# Patient Record
Sex: Male | Born: 1935
Health system: Southern US, Community
[De-identification: ages and names within clinical notes are randomized; demographics above are authoritative.]

## PROBLEM LIST (undated history)

## (undated) DIAGNOSIS — I472 Ventricular tachycardia, unspecified: Secondary | ICD-10-CM

## (undated) DIAGNOSIS — Z8674 Personal history of sudden cardiac arrest: Secondary | ICD-10-CM

## (undated) DIAGNOSIS — I10 Essential (primary) hypertension: Secondary | ICD-10-CM

## (undated) DIAGNOSIS — E785 Hyperlipidemia, unspecified: Secondary | ICD-10-CM

## (undated) DIAGNOSIS — I447 Left bundle-branch block, unspecified: Secondary | ICD-10-CM

## (undated) DIAGNOSIS — I255 Ischemic cardiomyopathy: Secondary | ICD-10-CM

## (undated) DIAGNOSIS — Z8679 Personal history of other diseases of the circulatory system: Secondary | ICD-10-CM

## (undated) DIAGNOSIS — I509 Heart failure, unspecified: Secondary | ICD-10-CM

## (undated) DIAGNOSIS — I2510 Atherosclerotic heart disease of native coronary artery without angina pectoris: Secondary | ICD-10-CM

## (undated) HISTORY — DX: Personal history of other diseases of the circulatory system: Z86.79

## (undated) HISTORY — DX: Ventricular tachycardia, unspecified: I47.20

## (undated) HISTORY — DX: Ischemic cardiomyopathy: I25.5

## (undated) HISTORY — DX: Personal history of sudden cardiac arrest: Z86.74

## (undated) HISTORY — DX: Atherosclerotic heart disease of native coronary artery without angina pectoris: I25.10

## (undated) HISTORY — PX: TONSILECTOMY, ADENOIDECTOMY, BILATERAL MYRINGOTOMY AND TUBES: SHX2538

## (undated) HISTORY — DX: Essential (primary) hypertension: I10

## (undated) HISTORY — DX: Left bundle-branch block, unspecified: I44.7

## (undated) HISTORY — DX: Hyperlipidemia, unspecified: E78.5

## (undated) HISTORY — PX: DOPPLER ECHOCARDIOGRAPHY: SHX263

## (undated) HISTORY — PX: CARDIAC DEFIBRILLATOR PLACEMENT: SHX171

## (undated) HISTORY — DX: Heart failure, unspecified: I50.9

## (undated) HISTORY — DX: Ventricular tachycardia: I47.2

---

## 2001-03-19 ENCOUNTER — Inpatient Hospital Stay (HOSPITAL_COMMUNITY): Admission: EM | Admit: 2001-03-19 | Discharge: 2001-03-23 | Payer: Self-pay | Admitting: Cardiology

## 2001-03-22 ENCOUNTER — Encounter: Payer: Self-pay | Admitting: Cardiology

## 2001-03-22 ENCOUNTER — Encounter: Payer: Self-pay | Admitting: Internal Medicine

## 2003-03-26 ENCOUNTER — Encounter: Payer: Self-pay | Admitting: Physician Assistant

## 2003-09-24 ENCOUNTER — Encounter: Payer: Self-pay | Admitting: Cardiology

## 2003-11-25 ENCOUNTER — Ambulatory Visit: Payer: Self-pay | Admitting: Internal Medicine

## 2004-03-18 ENCOUNTER — Ambulatory Visit: Payer: Self-pay

## 2004-04-07 ENCOUNTER — Ambulatory Visit: Payer: Self-pay | Admitting: Cardiology

## 2004-04-08 ENCOUNTER — Ambulatory Visit: Payer: Self-pay | Admitting: Cardiology

## 2004-04-14 ENCOUNTER — Ambulatory Visit (HOSPITAL_COMMUNITY): Admission: RE | Admit: 2004-04-14 | Discharge: 2004-04-14 | Payer: Self-pay | Admitting: Cardiology

## 2004-04-14 ENCOUNTER — Ambulatory Visit: Payer: Self-pay | Admitting: Internal Medicine

## 2004-04-23 ENCOUNTER — Ambulatory Visit: Payer: Self-pay | Admitting: Internal Medicine

## 2004-04-27 ENCOUNTER — Ambulatory Visit: Payer: Self-pay | Admitting: Internal Medicine

## 2004-05-11 ENCOUNTER — Ambulatory Visit: Payer: Self-pay | Admitting: Internal Medicine

## 2004-05-24 ENCOUNTER — Ambulatory Visit: Payer: Self-pay | Admitting: Internal Medicine

## 2004-05-27 ENCOUNTER — Ambulatory Visit: Payer: Self-pay | Admitting: Internal Medicine

## 2004-07-21 ENCOUNTER — Ambulatory Visit: Payer: Self-pay | Admitting: Cardiology

## 2004-08-05 ENCOUNTER — Ambulatory Visit: Payer: Self-pay | Admitting: Cardiology

## 2004-08-12 ENCOUNTER — Ambulatory Visit: Payer: Self-pay

## 2004-08-24 ENCOUNTER — Ambulatory Visit: Payer: Self-pay | Admitting: Internal Medicine

## 2004-09-09 ENCOUNTER — Ambulatory Visit: Payer: Self-pay

## 2004-11-29 ENCOUNTER — Ambulatory Visit: Payer: Self-pay

## 2004-12-15 ENCOUNTER — Ambulatory Visit: Payer: Self-pay | Admitting: Cardiology

## 2005-01-18 ENCOUNTER — Ambulatory Visit: Payer: Self-pay | Admitting: Internal Medicine

## 2005-02-24 ENCOUNTER — Ambulatory Visit: Payer: Self-pay | Admitting: Internal Medicine

## 2005-03-14 ENCOUNTER — Ambulatory Visit: Payer: Self-pay | Admitting: Cardiology

## 2005-03-21 ENCOUNTER — Ambulatory Visit: Payer: Self-pay | Admitting: Internal Medicine

## 2005-06-09 ENCOUNTER — Ambulatory Visit: Payer: Self-pay | Admitting: Cardiology

## 2005-07-13 ENCOUNTER — Ambulatory Visit: Payer: Self-pay | Admitting: Cardiovascular Disease

## 2005-08-03 ENCOUNTER — Ambulatory Visit: Payer: Self-pay | Admitting: Cardiology

## 2005-08-24 ENCOUNTER — Ambulatory Visit: Payer: Self-pay | Admitting: Internal Medicine

## 2005-09-08 ENCOUNTER — Ambulatory Visit: Payer: Self-pay | Admitting: Internal Medicine

## 2005-11-15 ENCOUNTER — Ambulatory Visit: Payer: Self-pay | Admitting: Internal Medicine

## 2005-12-19 ENCOUNTER — Ambulatory Visit: Payer: Self-pay | Admitting: Cardiology

## 2005-12-20 ENCOUNTER — Ambulatory Visit: Payer: Self-pay | Admitting: Cardiology

## 2006-03-09 ENCOUNTER — Ambulatory Visit: Payer: Self-pay | Admitting: Internal Medicine

## 2006-03-27 ENCOUNTER — Ambulatory Visit: Payer: Self-pay | Admitting: Cardiology

## 2006-06-08 ENCOUNTER — Ambulatory Visit: Payer: Self-pay | Admitting: Internal Medicine

## 2006-06-19 ENCOUNTER — Ambulatory Visit: Payer: Self-pay | Admitting: Cardiology

## 2006-06-21 ENCOUNTER — Ambulatory Visit: Payer: Self-pay | Admitting: Cardiology

## 2006-06-21 LAB — CONVERTED CEMR LAB
BUN: 19 mg/dL (ref 6–23)
Basophils Absolute: 0 10*3/uL (ref 0.0–0.1)
Basophils Relative: 0.7 % (ref 0.0–1.0)
CO2: 30 meq/L (ref 19–32)
Calcium: 9.5 mg/dL (ref 8.4–10.5)
Chloride: 107 meq/L (ref 96–112)
Creatinine, Ser: 1.4 mg/dL (ref 0.4–1.5)
Eosinophils Absolute: 0.2 10*3/uL (ref 0.0–0.6)
Eosinophils Relative: 3 % (ref 0.0–5.0)
GFR calc Af Amer: 64 mL/min
GFR calc non Af Amer: 53 mL/min
Glucose, Bld: 75 mg/dL (ref 70–99)
HCT: 39.8 % (ref 39.0–52.0)
Hemoglobin: 14.1 g/dL (ref 13.0–17.0)
Lymphocytes Relative: 24 % (ref 12.0–46.0)
MCHC: 35.3 g/dL (ref 30.0–36.0)
MCV: 87.8 fL (ref 78.0–100.0)
Monocytes Absolute: 0.8 10*3/uL — ABNORMAL HIGH (ref 0.2–0.7)
Monocytes Relative: 12.7 % — ABNORMAL HIGH (ref 3.0–11.0)
Neutro Abs: 3.6 10*3/uL (ref 1.4–7.7)
Neutrophils Relative %: 59.6 % (ref 43.0–77.0)
Platelets: 171 10*3/uL (ref 150–400)
Potassium: 4.1 meq/L (ref 3.5–5.1)
RBC: 4.54 M/uL (ref 4.22–5.81)
RDW: 14.4 % (ref 11.5–14.6)
Sodium: 142 meq/L (ref 135–145)
WBC: 6 10*3/uL (ref 4.5–10.5)

## 2006-07-12 ENCOUNTER — Ambulatory Visit: Payer: Self-pay | Admitting: Cardiology

## 2006-08-08 ENCOUNTER — Ambulatory Visit: Payer: Self-pay | Admitting: Cardiology

## 2006-08-23 ENCOUNTER — Ambulatory Visit: Payer: Self-pay | Admitting: Cardiology

## 2006-09-07 ENCOUNTER — Ambulatory Visit: Payer: Self-pay | Admitting: Internal Medicine

## 2006-09-11 ENCOUNTER — Ambulatory Visit: Payer: Self-pay | Admitting: Cardiology

## 2006-10-10 ENCOUNTER — Ambulatory Visit: Payer: Self-pay | Admitting: Cardiology

## 2006-11-28 ENCOUNTER — Ambulatory Visit: Payer: Self-pay | Admitting: Cardiology

## 2006-11-29 ENCOUNTER — Ambulatory Visit: Payer: Self-pay | Admitting: Cardiology

## 2006-11-29 ENCOUNTER — Encounter: Payer: Self-pay | Admitting: Cardiology

## 2006-11-29 ENCOUNTER — Ambulatory Visit: Payer: Self-pay

## 2006-11-29 LAB — CONVERTED CEMR LAB
ALT: 50 units/L (ref 0–53)
AST: 42 units/L — ABNORMAL HIGH (ref 0–37)
Albumin: 4.2 g/dL (ref 3.5–5.2)
Alkaline Phosphatase: 60 units/L (ref 39–117)
BUN: 16 mg/dL (ref 6–23)
Basophils Absolute: 0 10*3/uL (ref 0.0–0.1)
Basophils Relative: 0.1 % (ref 0.0–1.0)
Bilirubin, Direct: 0.2 mg/dL (ref 0.0–0.3)
CO2: 31 meq/L (ref 19–32)
Calcium: 9.7 mg/dL (ref 8.4–10.5)
Chloride: 99 meq/L (ref 96–112)
Cholesterol: 131 mg/dL (ref 0–200)
Creatinine, Ser: 1.1 mg/dL (ref 0.4–1.5)
Eosinophils Absolute: 0.1 10*3/uL (ref 0.0–0.6)
Eosinophils Relative: 1.8 % (ref 0.0–5.0)
GFR calc Af Amer: 84 mL/min
GFR calc non Af Amer: 70 mL/min
Glucose, Bld: 128 mg/dL — ABNORMAL HIGH (ref 70–99)
HCT: 41.7 % (ref 39.0–52.0)
HDL: 39.8 mg/dL (ref >39.0)
Hemoglobin: 14.6 g/dL (ref 13.0–17.0)
LDL Cholesterol: 53 mg/dL (ref 0–99)
Lymphocytes Relative: 26.3 % (ref 12.0–46.0)
MCHC: 35 g/dL (ref 30.0–36.0)
MCV: 88.1 fL (ref 78.0–100.0)
Monocytes Absolute: 0.6 10*3/uL (ref 0.2–0.7)
Monocytes Relative: 9.8 % (ref 3.0–11.0)
Neutro Abs: 3.5 10*3/uL (ref 1.4–7.7)
Neutrophils Relative %: 62 % (ref 43.0–77.0)
Platelets: 172 10*3/uL (ref 150–400)
Potassium: 4.7 meq/L (ref 3.5–5.1)
RBC: 4.73 M/uL (ref 4.22–5.81)
RDW: 13.7 % (ref 11.5–14.6)
Sodium: 139 meq/L (ref 135–145)
Total Bilirubin: 0.9 mg/dL (ref 0.3–1.2)
Total CHOL/HDL Ratio: 3.3
Total Protein: 7.1 g/dL (ref 6.0–8.3)
Triglycerides: 190 mg/dL — ABNORMAL HIGH (ref 0–149)
VLDL: 38 mg/dL (ref 0–40)
WBC: 5.7 10*3/uL (ref 4.5–10.5)

## 2006-12-12 ENCOUNTER — Ambulatory Visit: Payer: Self-pay | Admitting: Cardiology

## 2007-01-09 ENCOUNTER — Ambulatory Visit: Payer: Self-pay | Admitting: Cardiology

## 2007-02-13 ENCOUNTER — Ambulatory Visit: Payer: Self-pay | Admitting: Cardiology

## 2007-03-01 ENCOUNTER — Ambulatory Visit: Payer: Self-pay | Admitting: Internal Medicine

## 2007-03-21 ENCOUNTER — Ambulatory Visit: Payer: Self-pay | Admitting: Cardiology

## 2007-04-03 ENCOUNTER — Ambulatory Visit: Payer: Self-pay | Admitting: Cardiology

## 2007-04-17 ENCOUNTER — Ambulatory Visit: Payer: Self-pay | Admitting: Cardiology

## 2007-05-15 ENCOUNTER — Ambulatory Visit: Payer: Self-pay | Admitting: Cardiology

## 2007-05-17 ENCOUNTER — Ambulatory Visit: Payer: Self-pay | Admitting: Cardiology

## 2007-05-17 LAB — CONVERTED CEMR LAB
BUN: 22 mg/dL (ref 6–23)
Basophils Absolute: 0.2 10*3/uL — ABNORMAL HIGH (ref 0.0–0.1)
Basophils Relative: 2.9 % — ABNORMAL HIGH (ref 0.0–1.0)
CO2: 29 meq/L (ref 19–32)
Calcium: 9.8 mg/dL (ref 8.4–10.5)
Chloride: 103 meq/L (ref 96–112)
Creatinine, Ser: 1.1 mg/dL (ref 0.4–1.5)
Eosinophils Absolute: 0.1 10*3/uL (ref 0.0–0.7)
Eosinophils Relative: 2 % (ref 0.0–5.0)
GFR calc Af Amer: 84 mL/min
GFR calc non Af Amer: 70 mL/min
Glucose, Bld: 104 mg/dL — ABNORMAL HIGH (ref 70–99)
HCT: 42.8 % (ref 39.0–52.0)
Hemoglobin: 14.6 g/dL (ref 13.0–17.0)
Lymphocytes Relative: 24 % (ref 12.0–46.0)
MCHC: 34.2 g/dL (ref 30.0–36.0)
MCV: 89 fL (ref 78.0–100.0)
Monocytes Absolute: 0.5 10*3/uL (ref 0.1–1.0)
Monocytes Relative: 7.4 % (ref 3.0–12.0)
Neutro Abs: 4.5 10*3/uL (ref 1.4–7.7)
Neutrophils Relative %: 63.7 % (ref 43.0–77.0)
Platelets: 176 10*3/uL (ref 150–400)
Potassium: 4.5 meq/L (ref 3.5–5.1)
RBC: 4.81 M/uL (ref 4.22–5.81)
RDW: 14.1 % (ref 11.5–14.6)
Sodium: 139 meq/L (ref 135–145)
WBC: 7 10*3/uL (ref 4.5–10.5)

## 2007-06-12 ENCOUNTER — Ambulatory Visit: Payer: Self-pay | Admitting: Cardiology

## 2007-07-18 ENCOUNTER — Ambulatory Visit: Payer: Self-pay | Admitting: Cardiology

## 2007-07-18 ENCOUNTER — Ambulatory Visit: Payer: Self-pay | Admitting: Cardiovascular Disease

## 2007-08-02 ENCOUNTER — Ambulatory Visit: Payer: Self-pay | Admitting: Internal Medicine

## 2007-08-13 ENCOUNTER — Ambulatory Visit: Payer: Self-pay | Admitting: Cardiology

## 2007-08-16 ENCOUNTER — Ambulatory Visit: Payer: Self-pay | Admitting: Internal Medicine

## 2007-09-12 ENCOUNTER — Ambulatory Visit: Payer: Self-pay | Admitting: Cardiology

## 2007-09-12 ENCOUNTER — Ambulatory Visit: Payer: Self-pay | Admitting: Cardiovascular Disease

## 2007-09-12 LAB — CONVERTED CEMR LAB
BUN: 23 mg/dL (ref 6–23)
Basophils Absolute: 0 10*3/uL (ref 0.0–0.1)
Basophils Relative: 0.2 % (ref 0.0–3.0)
CO2: 28 meq/L (ref 19–32)
Calcium: 9.2 mg/dL (ref 8.4–10.5)
Chloride: 110 meq/L (ref 96–112)
Creatinine, Ser: 1.5 mg/dL (ref 0.4–1.5)
Eosinophils Absolute: 0.1 10*3/uL (ref 0.0–0.7)
Eosinophils Relative: 2 % (ref 0.0–5.0)
GFR calc Af Amer: 59 mL/min
GFR calc non Af Amer: 49 mL/min
Glucose, Bld: 94 mg/dL (ref 70–99)
HCT: 40.7 % (ref 39.0–52.0)
Hemoglobin: 14.4 g/dL (ref 13.0–17.0)
Lymphocytes Relative: 22.1 % (ref 12.0–46.0)
MCHC: 35.3 g/dL (ref 30.0–36.0)
MCV: 89.8 fL (ref 78.0–100.0)
Monocytes Absolute: 0.7 10*3/uL (ref 0.1–1.0)
Monocytes Relative: 10.7 % (ref 3.0–12.0)
Neutro Abs: 4.3 10*3/uL (ref 1.4–7.7)
Neutrophils Relative %: 65 % (ref 43.0–77.0)
Platelets: 182 10*3/uL (ref 150–400)
Potassium: 4.9 meq/L (ref 3.5–5.1)
RBC: 4.53 M/uL (ref 4.22–5.81)
RDW: 13.7 % (ref 11.5–14.6)
Sodium: 142 meq/L (ref 135–145)
WBC: 6.6 10*3/uL (ref 4.5–10.5)

## 2007-12-13 ENCOUNTER — Ambulatory Visit: Payer: Self-pay | Admitting: Internal Medicine

## 2008-01-24 ENCOUNTER — Ambulatory Visit: Payer: Self-pay | Admitting: Cardiology

## 2008-01-31 ENCOUNTER — Ambulatory Visit: Payer: Self-pay | Admitting: Cardiology

## 2008-01-31 LAB — CONVERTED CEMR LAB
ALT: 24 units/L (ref 0–53)
AST: 23 units/L (ref 0–37)
Albumin: 4.1 g/dL (ref 3.5–5.2)
Alkaline Phosphatase: 51 units/L (ref 39–117)
BUN: 34 mg/dL — ABNORMAL HIGH (ref 6–23)
Basophils Absolute: 0 10*3/uL (ref 0.0–0.1)
Basophils Relative: 0.3 % (ref 0.0–3.0)
Bilirubin, Direct: 0.2 mg/dL (ref 0.0–0.3)
CO2: 26 meq/L (ref 19–32)
Calcium: 9.7 mg/dL (ref 8.4–10.5)
Chloride: 99 meq/L (ref 96–112)
Cholesterol: 107 mg/dL (ref 0–200)
Creatinine, Ser: 1.5 mg/dL (ref 0.4–1.5)
Eosinophils Absolute: 0.2 10*3/uL (ref 0.0–0.7)
Eosinophils Relative: 2.4 % (ref 0.0–5.0)
GFR calc Af Amer: 59 mL/min
GFR calc non Af Amer: 49 mL/min
Glucose, Bld: 148 mg/dL — ABNORMAL HIGH (ref 70–99)
HCT: 42.5 % (ref 39.0–52.0)
HDL: 31.2 mg/dL — ABNORMAL LOW (ref >39.0)
Hemoglobin: 14.4 g/dL (ref 13.0–17.0)
LDL Cholesterol: 40 mg/dL (ref 0–99)
Lymphocytes Relative: 24.3 % (ref 12.0–46.0)
MCHC: 33.8 g/dL (ref 30.0–36.0)
MCV: 88.7 fL (ref 78.0–100.0)
Monocytes Absolute: 0.6 10*3/uL (ref 0.1–1.0)
Monocytes Relative: 9.2 % (ref 3.0–12.0)
Neutro Abs: 4.2 10*3/uL (ref 1.4–7.7)
Neutrophils Relative %: 63.8 % (ref 43.0–77.0)
Platelets: 165 10*3/uL (ref 150–400)
Potassium: 4.9 meq/L (ref 3.5–5.1)
RBC: 4.8 M/uL (ref 4.22–5.81)
RDW: 13 % (ref 11.5–14.6)
Sodium: 138 meq/L (ref 135–145)
Total Bilirubin: 1 mg/dL (ref 0.3–1.2)
Total CHOL/HDL Ratio: 3.4
Total Protein: 6.6 g/dL (ref 6.0–8.3)
Triglycerides: 177 mg/dL — ABNORMAL HIGH (ref 0–149)
VLDL: 35 mg/dL (ref 0–40)
WBC: 6.6 10*3/uL (ref 4.5–10.5)

## 2008-03-19 ENCOUNTER — Encounter: Payer: Self-pay | Admitting: Internal Medicine

## 2008-03-25 ENCOUNTER — Ambulatory Visit: Payer: Self-pay | Admitting: Internal Medicine

## 2008-03-25 ENCOUNTER — Encounter: Payer: Self-pay | Admitting: Internal Medicine

## 2008-03-25 DIAGNOSIS — I10 Essential (primary) hypertension: Secondary | ICD-10-CM | POA: Insufficient documentation

## 2008-03-25 DIAGNOSIS — I4901 Ventricular fibrillation: Secondary | ICD-10-CM

## 2008-03-25 DIAGNOSIS — I472 Ventricular tachycardia: Secondary | ICD-10-CM

## 2008-03-25 DIAGNOSIS — Z950 Presence of cardiac pacemaker: Secondary | ICD-10-CM | POA: Insufficient documentation

## 2008-03-25 DIAGNOSIS — Z8674 Personal history of sudden cardiac arrest: Secondary | ICD-10-CM

## 2008-03-25 DIAGNOSIS — I447 Left bundle-branch block, unspecified: Secondary | ICD-10-CM

## 2008-03-25 DIAGNOSIS — E785 Hyperlipidemia, unspecified: Secondary | ICD-10-CM

## 2008-03-25 DIAGNOSIS — I2589 Other forms of chronic ischemic heart disease: Secondary | ICD-10-CM

## 2008-03-25 DIAGNOSIS — I2510 Atherosclerotic heart disease of native coronary artery without angina pectoris: Secondary | ICD-10-CM

## 2008-03-25 DIAGNOSIS — Z95810 Presence of automatic (implantable) cardiac defibrillator: Secondary | ICD-10-CM

## 2008-06-09 DIAGNOSIS — I5042 Chronic combined systolic (congestive) and diastolic (congestive) heart failure: Secondary | ICD-10-CM | POA: Insufficient documentation

## 2008-06-09 DIAGNOSIS — I5022 Chronic systolic (congestive) heart failure: Secondary | ICD-10-CM | POA: Insufficient documentation

## 2008-06-10 ENCOUNTER — Ambulatory Visit: Payer: Self-pay | Admitting: Cardiology

## 2008-06-10 LAB — CONVERTED CEMR LAB
BUN: 19 mg/dL
Basophils Absolute: 0.1 K/uL
Basophils Relative: 0.8 %
CO2: 30 meq/L
Calcium: 9.5 mg/dL
Chloride: 109 meq/L
Creatinine, Ser: 1.2 mg/dL
Eosinophils Absolute: 0.2 K/uL
Eosinophils Relative: 2.4 %
GFR calc non Af Amer: 62.81 mL/min
Glucose, Bld: 88 mg/dL
HCT: 38.3 % — ABNORMAL LOW
Hemoglobin: 13.4 g/dL
Lymphocytes Relative: 25.5 %
Lymphs Abs: 1.7 K/uL
MCHC: 35 g/dL
MCV: 89.1 fL
Monocytes Absolute: 0.6 K/uL
Monocytes Relative: 9.7 %
Neutro Abs: 4.1 K/uL
Neutrophils Relative %: 61.6 %
Platelets: 160 K/uL
Potassium: 4.1 meq/L
RBC: 4.3 M/uL
RDW: 13.2 %
Sodium: 142 meq/L
WBC: 6.7 10*3/microliter

## 2008-06-12 ENCOUNTER — Encounter: Payer: Self-pay | Admitting: Cardiology

## 2008-09-11 ENCOUNTER — Ambulatory Visit: Payer: Self-pay | Admitting: Internal Medicine

## 2008-10-12 HISTORY — PX: CARDIAC CATHETERIZATION: SHX172

## 2008-10-16 ENCOUNTER — Ambulatory Visit: Payer: Self-pay | Admitting: Cardiology

## 2008-10-21 ENCOUNTER — Telehealth (INDEPENDENT_AMBULATORY_CARE_PROVIDER_SITE_OTHER): Payer: Self-pay | Admitting: *Deleted

## 2008-10-22 ENCOUNTER — Ambulatory Visit: Payer: Self-pay

## 2008-10-22 ENCOUNTER — Ambulatory Visit: Payer: Self-pay | Admitting: Cardiovascular Disease

## 2008-10-22 ENCOUNTER — Encounter (HOSPITAL_COMMUNITY): Admission: RE | Admit: 2008-10-22 | Discharge: 2008-12-31 | Payer: Self-pay | Admitting: Cardiovascular Disease

## 2008-10-22 ENCOUNTER — Ambulatory Visit: Payer: Self-pay | Admitting: Cardiology

## 2008-10-24 LAB — CONVERTED CEMR LAB
ALT: 26 units/L (ref 0–53)
AST: 27 units/L (ref 0–37)
Albumin: 4.2 g/dL (ref 3.5–5.2)
Alkaline Phosphatase: 59 units/L (ref 39–117)
BUN: 29 mg/dL — ABNORMAL HIGH (ref 6–23)
Basophils Absolute: 0 10*3/uL (ref 0.0–0.1)
Basophils Relative: 0.3 % (ref 0.0–3.0)
Bilirubin, Direct: 0.2 mg/dL (ref 0.0–0.3)
CO2: 30 meq/L (ref 19–32)
Calcium: 9.6 mg/dL (ref 8.4–10.5)
Chloride: 108 meq/L (ref 96–112)
Cholesterol: 114 mg/dL (ref 0–200)
Creatinine, Ser: 1.5 mg/dL (ref 0.4–1.5)
Eosinophils Absolute: 0.2 10*3/uL (ref 0.0–0.7)
Eosinophils Relative: 3.2 % (ref 0.0–5.0)
GFR calc non Af Amer: 48.5 mL/min (ref >60)
Glucose, Bld: 138 mg/dL — ABNORMAL HIGH (ref 70–99)
HCT: 38 % — ABNORMAL LOW (ref 39.0–52.0)
HDL: 34.9 mg/dL — ABNORMAL LOW (ref >39.00)
Hemoglobin: 13.2 g/dL (ref 13.0–17.0)
LDL Cholesterol: 44 mg/dL (ref 0–99)
Lymphocytes Relative: 26 % (ref 12.0–46.0)
Lymphs Abs: 1.6 10*3/uL (ref 0.7–4.0)
MCHC: 34.8 g/dL (ref 30.0–36.0)
MCV: 89.7 fL (ref 78.0–100.0)
Monocytes Absolute: 0.5 10*3/uL (ref 0.1–1.0)
Monocytes Relative: 8.5 % (ref 3.0–12.0)
Neutro Abs: 3.8 10*3/uL (ref 1.4–7.7)
Neutrophils Relative %: 62 % (ref 43.0–77.0)
Platelets: 177 10*3/uL (ref 150.0–400.0)
Potassium: 5.8 meq/L — ABNORMAL HIGH (ref 3.5–5.1)
RBC: 4.23 M/uL (ref 4.22–5.81)
RDW: 13.5 % (ref 11.5–14.6)
Sodium: 145 meq/L (ref 135–145)
TSH: 2.05 microintl units/mL (ref 0.35–5.50)
Total Bilirubin: 0.9 mg/dL (ref 0.3–1.2)
Total CHOL/HDL Ratio: 3
Total Protein: 7.2 g/dL (ref 6.0–8.3)
Triglycerides: 174 mg/dL — ABNORMAL HIGH (ref 0.0–149.0)
VLDL: 34.8 mg/dL (ref 0.0–40.0)
WBC: 6.1 10*3/uL (ref 4.5–10.5)

## 2008-10-29 ENCOUNTER — Encounter (INDEPENDENT_AMBULATORY_CARE_PROVIDER_SITE_OTHER): Payer: Self-pay | Admitting: *Deleted

## 2008-10-29 ENCOUNTER — Ambulatory Visit: Payer: Self-pay | Admitting: Cardiology

## 2008-10-29 DIAGNOSIS — R9430 Abnormal result of cardiovascular function study, unspecified: Secondary | ICD-10-CM | POA: Insufficient documentation

## 2008-10-29 LAB — CONVERTED CEMR LAB
BUN: 20 mg/dL (ref 6–23)
Eosinophils Relative: 1.3 % (ref 0.0–5.0)
GFR calc non Af Amer: 77.43 mL/min (ref >60)
HCT: 39.2 % (ref 39.0–52.0)
Hemoglobin: 13.4 g/dL (ref 13.0–17.0)
Lymphocytes Relative: 18.8 % (ref 12.0–46.0)
Lymphs Abs: 1.4 10*3/uL (ref 0.7–4.0)
Monocytes Relative: 8.5 % (ref 3.0–12.0)
Platelets: 185 10*3/uL (ref 150.0–400.0)
Potassium: 4.6 meq/L (ref 3.5–5.1)
Prothrombin Time: 11.3 s (ref 9.1–11.7)
Sodium: 138 meq/L (ref 135–145)
WBC: 7.5 10*3/uL (ref 4.5–10.5)
aPTT: 25.9 s (ref 21.7–28.8)

## 2008-10-31 ENCOUNTER — Inpatient Hospital Stay (HOSPITAL_BASED_OUTPATIENT_CLINIC_OR_DEPARTMENT_OTHER): Admission: RE | Admit: 2008-10-31 | Discharge: 2008-10-31 | Payer: Self-pay | Admitting: Cardiology

## 2008-10-31 ENCOUNTER — Ambulatory Visit: Payer: Self-pay | Admitting: Internal Medicine

## 2008-11-13 ENCOUNTER — Ambulatory Visit: Payer: Self-pay | Admitting: Cardiology

## 2009-01-15 ENCOUNTER — Telehealth: Payer: Self-pay | Admitting: Internal Medicine

## 2009-01-15 ENCOUNTER — Encounter: Payer: Self-pay | Admitting: Internal Medicine

## 2009-01-27 ENCOUNTER — Ambulatory Visit: Payer: Self-pay | Admitting: Internal Medicine

## 2009-02-06 ENCOUNTER — Ambulatory Visit: Payer: Self-pay | Admitting: Internal Medicine

## 2009-02-10 LAB — CONVERTED CEMR LAB
Basophils Relative: 0.3 % (ref 0.0–3.0)
CO2: 31 meq/L (ref 19–32)
Chloride: 103 meq/L (ref 96–112)
Creatinine, Ser: 1 mg/dL (ref 0.4–1.5)
Eosinophils Absolute: 0.1 10*3/uL (ref 0.0–0.7)
Eosinophils Relative: 1.5 % (ref 0.0–5.0)
HCT: 40.8 % (ref 39.0–52.0)
Hemoglobin: 13.6 g/dL (ref 13.0–17.0)
MCHC: 33.2 g/dL (ref 30.0–36.0)
MCV: 88.6 fL (ref 78.0–100.0)
Monocytes Absolute: 0.5 10*3/uL (ref 0.1–1.0)
Neutro Abs: 4 10*3/uL (ref 1.4–7.7)
Potassium: 4.2 meq/L (ref 3.5–5.1)
RBC: 4.61 M/uL (ref 4.22–5.81)
Sodium: 141 meq/L (ref 135–145)

## 2009-02-13 ENCOUNTER — Encounter (INDEPENDENT_AMBULATORY_CARE_PROVIDER_SITE_OTHER): Payer: Self-pay | Admitting: *Deleted

## 2009-02-13 ENCOUNTER — Ambulatory Visit (HOSPITAL_COMMUNITY): Admission: RE | Admit: 2009-02-13 | Discharge: 2009-02-14 | Payer: Self-pay | Admitting: Internal Medicine

## 2009-02-13 ENCOUNTER — Ambulatory Visit: Payer: Self-pay | Admitting: Internal Medicine

## 2009-02-19 ENCOUNTER — Telehealth: Payer: Self-pay | Admitting: Cardiology

## 2009-02-25 ENCOUNTER — Encounter (INDEPENDENT_AMBULATORY_CARE_PROVIDER_SITE_OTHER): Payer: Self-pay | Admitting: *Deleted

## 2009-03-05 ENCOUNTER — Encounter: Payer: Self-pay | Admitting: Internal Medicine

## 2009-03-05 ENCOUNTER — Ambulatory Visit: Payer: Self-pay

## 2009-05-15 ENCOUNTER — Ambulatory Visit: Payer: Self-pay | Admitting: Cardiology

## 2009-06-12 ENCOUNTER — Ambulatory Visit: Payer: Self-pay | Admitting: Internal Medicine

## 2009-09-11 ENCOUNTER — Ambulatory Visit: Payer: Self-pay

## 2009-09-11 ENCOUNTER — Encounter: Payer: Self-pay | Admitting: Internal Medicine

## 2009-11-03 ENCOUNTER — Encounter: Payer: Self-pay | Admitting: Cardiology

## 2009-11-04 ENCOUNTER — Encounter: Payer: Self-pay | Admitting: Cardiology

## 2009-11-04 ENCOUNTER — Ambulatory Visit: Payer: Self-pay | Admitting: Cardiology

## 2009-11-05 LAB — CONVERTED CEMR LAB
BUN: 18 mg/dL (ref 6–23)
Basophils Relative: 0.5 % (ref 0.0–3.0)
CO2: 32 meq/L (ref 19–32)
Chloride: 106 meq/L (ref 96–112)
Creatinine, Ser: 1 mg/dL (ref 0.4–1.5)
Eosinophils Absolute: 0.1 10*3/uL (ref 0.0–0.7)
Eosinophils Relative: 1.9 % (ref 0.0–5.0)
HCT: 40.3 % (ref 39.0–52.0)
Lymphs Abs: 1.3 10*3/uL (ref 0.7–4.0)
MCHC: 34.3 g/dL (ref 30.0–36.0)
MCV: 89.4 fL (ref 78.0–100.0)
Monocytes Absolute: 0.6 10*3/uL (ref 0.1–1.0)
Neutrophils Relative %: 71.6 % (ref 43.0–77.0)
Platelets: 188 10*3/uL (ref 150.0–400.0)
Potassium: 4.4 meq/L (ref 3.5–5.1)
RBC: 4.51 M/uL (ref 4.22–5.81)

## 2009-11-12 ENCOUNTER — Encounter: Payer: Self-pay | Admitting: Cardiology

## 2009-11-12 ENCOUNTER — Ambulatory Visit: Payer: Self-pay | Admitting: Cardiology

## 2009-11-12 ENCOUNTER — Ambulatory Visit: Payer: Self-pay

## 2009-11-12 ENCOUNTER — Ambulatory Visit (HOSPITAL_COMMUNITY): Admission: RE | Admit: 2009-11-12 | Discharge: 2009-11-12 | Payer: Self-pay | Admitting: Cardiology

## 2009-12-21 ENCOUNTER — Encounter: Payer: Self-pay | Admitting: Internal Medicine

## 2009-12-21 ENCOUNTER — Ambulatory Visit: Payer: Self-pay | Admitting: Internal Medicine

## 2009-12-31 ENCOUNTER — Ambulatory Visit: Payer: Self-pay | Admitting: Internal Medicine

## 2010-01-31 LAB — CONVERTED CEMR LAB
GFR calc non Af Amer: 57.2 mL/min (ref >60)
Glucose, Bld: 95 mg/dL (ref 70–99)
Potassium: 4.5 meq/L (ref 3.5–5.1)
Sodium: 140 meq/L (ref 135–145)

## 2010-02-04 NOTE — Cardiovascular Report (Signed)
Summary: Office Visit   Office Visit   Imported By: Roderic Ovens 01/01/2010 16:16:36  _____________________________________________________________________  External Attachment:    Type:   Image     Comment:   External Document

## 2010-02-04 NOTE — Cardiovascular Report (Signed)
Summary: Pre Op Orders  Pre Op Orders   Imported By: Roderic Ovens 02/02/2009 13:41:37  _____________________________________________________________________  External Attachment:    Type:   Image     Comment:   External Document

## 2010-02-04 NOTE — Cardiovascular Report (Signed)
Summary: Office Visit   Office Visit   Imported By: Roderic Ovens 03/13/2009 12:28:35  _____________________________________________________________________  External Attachment:    Type:   Image     Comment:   External Document

## 2010-02-04 NOTE — Procedures (Signed)
Summary: [facility] Device [facility]   Current Medications (verified): 1)  Coreg 25 Mg Tabs (Carvedilol) .[name].. 1 By Mouth Two Times A Day 2)  Spironolactone 25 Mg Tabs (Spironolactone) .... Take One- Half Tablet By Mouth Daily 3)  Crestor 20 Mg Tabs (Rosuvastatin Calcium) .... Take One Tablet By Mouth Daily. 4)  Aspirin 81 Mg Tbec (Aspirin) .... Take One Tablet By Mouth Daily 5)  Enalapril Maleate 10 Mg Tabs (Enalapril Maleate) .... Take 1/2 Tablet Two Times A Day 6)  Indomethacin Cr 75 Mg Cr-Caps (Indomethacin) .[name].. 1 Tab  As Needed  Allergies (verified): No Known Drug Allergies   Parameters Mode:  DDDR+     Lower Rate Limit:  60     Upper Rate Limit:  130 Paced AV Delay:  300      ICD Specifications Following MD:  Sherryl Manges, MD     ICD Vendor:  St Jude     ICD Model Number:  [phone]     ICD Serial Number:  [id number] ICD DOI:  02/13/2009     ICD Implanting MD:  Lewayne Bunting, MD  Lead 1:    Location: RA     DOI: 03/21/2001     Model #: 8119     Serial #: [id number]     Status: active Lead 2:    Location: RV     DOI: 03/21/2001     Model #: 5621     Serial #: [id number]     Status: active Lead 3:    Location: LV     DOI: 02/13/2009     Model #: 1158T     Serial #: [id number]     Status: active  Indications::  VT/VF arrest  Explantation Comments: 02/13/2009 [facility] [employer] [id number] explanted.  ICD Follow Up Remote Check?  No Charge Time:  9.0 seconds     Battery Est. Longevity:  5.9 years Underlying rhythm:  Brady@54  ICD Dependent:  No       ICD Device Measurements Atrium:  Amplitude: 3.6 mV, Impedance: 410 ohms, Threshold: 1.0 V at 0.6 msec Right Ventricle:  Amplitude: 12 mV, Impedance: 540 ohms, Threshold: 0.75 V at 0.5 msec Left Ventricle:  Impedance: 530 ohms, Threshold: 1.0 V at 0.5 msec Configuration: LV RING TO RV COIL Shock Impedance: 47 ohms   Episodes MS Episodes:  33     Percent Mode Switch:  <1%     Coumadin:  No Shock:  0     ATP:  0     Nonsustained:  0     Atrial  Pacing:  84%     Ventricular Pacing:  96%  Brady Parameters Mode DDDR     Lower Rate Limit:  60     Upper Rate Limit 120 PAV 150     Sensed AV Delay:  100  Tachy Zones VF:  230     VT:  200     VT1:  160     Next Cardiology Appt Due:  06/04/2010 Tech Comments:    Quick opt done and reprogrammed as above to improve V-pacing %.  PVARP reprogrammed for PMT episodes.  33 mode switch episodes the lonest 2:32 minutes, - coumadin. Rate response somewhat blunted but adequate for the patient's level of activity.  Mr. Schwager is limited in his activity because of leg pains.  For financial reasons we will see Mr. Thompson in the [place] office every 6 months.   Altha Harm, LPN  December 21, 2009 9:12 AM

## 2010-02-04 NOTE — Miscellaneous (Signed)
Summary: Device upgrade  Clinical Lists Changes  Observations: Added new observation of ICDLEADSTAT3: active (02/13/2009 13:34) Added new observation of ICDLEADSER3: [id number] (02/13/2009 13:34) Added new observation of ICDLEADMOD3: 1158T (02/13/2009 13:34) Added new observation of ICDLEADLOC3: LV (02/13/2009 13:34) Added new observation of ICDLEADDOI3: 02/13/2009 (02/13/2009 13:34) Added new observation of ICD IMP MD: Lewayne Bunting, MD (02/13/2009 13:34) Added new observation of ICD IMPL DTE: 02/13/2009 (02/13/2009 13:34) Added new observation of ICD SERL#: [id number]  (02/13/2009 13:34) Added new observation of ICD MODL#: [id number]  (02/13/2009 30:86) Added new observation of ICDMANUFACTR: St Jude  (02/13/2009 13:34) Added new observation of ICDEXPLCOMM: 02/13/2009 [employer] [id number] explanted.  (02/13/2009 13:34)       Parameters Mode:  DDDR+     Lower Rate Limit:  60     Upper Rate Limit:  130 Paced AV Delay:  300      ICD Specifications Following MD:  Lewayne Bunting, MD     ICD Vendor:  St Jude     ICD Model Number:  [id number]     ICD Serial Number:  [id number] ICD DOI:  02/13/2009     ICD Implanting MD:  Lewayne Bunting, MD  Lead 1:    Location: RA     DOI: 03/21/2001     Model #: [id number]     Serial #: [id number]     Status: active Lead 2:    Location: RV     DOI: 03/21/2001     Model #: 7253     Serial #: [id number]     Status: active Lead 3:    Location: LV     DOI: 02/13/2009     Model #: 1158T     Serial #: [id number]     Status: active  Indications::  VT/VF arrest  Explantation Comments: 02/13/2009 [facility] Scientific [employer]zm [id number] explanted.  ICD Follow Up ICD Dependent:  No      Episodes Coumadin:  No  Brady Parameters Mode VVI     Lower Rate Limit:  40     Upper Rate Limit 40  Tachy Zones VF:  210     VT:  160

## 2010-02-04 NOTE — Assessment & Plan Note (Signed)
Summary: f60m   Visit Type:  Follow-up Primary Provider:  none  CC:  no complaints.  History of Present Illness: The patient is 77 years old and return for management of CAD and CHF. He had an inferior MI in 1992. In 2003 he had ventricular fibrillation and underwent implantation of an ICD. Ejection fraction was 25% and left bundle branch block. He quite well over a number of years.    In February he had an ICD change and upgrade to a biventricular pacemaker. He says he feels stronger since that time. He's had no recent chest pain shortness of breath or palpitations.  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  Current Medications (verified): 1)  Coreg 25 Mg Tabs (Carvedilol) .[name].. 1 By Mouth Two Times A Day 2)  Spironolactone 25 Mg Tabs (Spironolactone) .... Take One- Half Tablet By Mouth Daily 3)  Crestor 20 Mg Tabs (Rosuvastatin Calcium) .... Take One Tablet By Mouth Daily. 4)  Aspirin 81 Mg Tbec (Aspirin) .... Take One Tablet By Mouth Daily 5)  Enalapril Maleate 10 Mg Tabs (Enalapril Maleate) .... Take 1/2 Tablet Two Times A Day 6)  Indomethacin Cr 75 Mg Cr-Caps (Indomethacin) .[name].. 1 Tab  As Needed  Allergies: No Known Drug Allergies  Past History:  Past Medical History: Reviewed history from 06/09/2008 and no changes required. HISTORY OF SUDDEN CARDIAC ARREST (ICD-V12.53) LBBB (ICD-426.3) CAD, UNSPECIFIED SITE (ICD-414.00) ICD - IN SITU (ICD-V45.02) Hx of VENTRICULAR TACHYCARDIA (ICD-427.1) Hx of VENTRICULAR FIBRILLATION (ICD-427.41) ISCHEMIC CARDIOMYOPATHY (ICD-414.8) HYPERTENSION, UNSPECIFIED (ICD-401.9) DYSLIPIDEMIA (ICD-272.4) 1. Coronary artery disease status post remote diaphragmatic wall     infarction and percutaneous coronary intervention in 1992. 2. Ischemic cardiomyopathy with ejection fraction 25%. 3. Class II to III congestive heart failure. 4. Status post Guidant dual chamber defibrillator programmed to  ventricular demand pacing. 5. Left bundle-branch block. 6. Hypertension. 7. Hyperlipidemia.   Review of Systems       ROS is negative except as outlined in HPI.   Vital Signs:  Patient profile:   77 year old male Height:      68 inches Weight:      198 pounds Pulse rate:   76 / minute Pulse rhythm:   regular BP sitting:   138 / 70  (left arm)  Vitals Entered By: Jacquelin Hawking, CMA (November 04, 2009 9:04 AM)  Physical Exam  Additional Exam:  Gen. Well-nourished, in no distress   Neck: No JVD, thyroid not enlarged, no carotid bruits Lungs: No tachypnea, clear without rales, rhonchi or wheezes Cardiovascular: Rhythm regular, PMI not displaced,  heart sounds  normal, no murmurs or gallops, no peripheral edema, pulses normal in all 4 extremities. Abdomen: BS normal, abdomen soft and non-tender without masses or organomegaly, no hepatosplenomegaly. MS: No deformities, no cyanosis or clubbing   Neuro:  No focal sns   Skin:  no lesions     Parameters Mode:  DDDR+     Lower Rate Limit:  60     Upper Rate Limit:  130 Paced AV Delay:  300      ICD Specifications Following MD:  Lewayne Bunting, MD     ICD Vendor:  St Jude     ICD Model Number:  [phone]     ICD Serial Number:  [id number] ICD DOI:  02/13/2009     ICD Implanting MD:  Lewayne Bunting, MD  Lead 1:    Location: RA  DOI: 03/21/2001     Model #: 1610     Serial #: [id number]     Status: active Lead 2:    Location: RV     DOI: 03/21/2001     Model #: 0981     Serial #: [id number]     Status: active Lead 3:    Location: LV     DOI: 02/13/2009     Model #: 1158T     Serial #: [id number]     Status: active  Indications::  VT/VF arrest  Explantation Comments: 02/13/2009 [facility] [employer] [id number] explanted.  ICD Follow Up ICD Dependent:  No       ICD Device Measurements Configuration: LV RING TO RV COIL  Episodes Coumadin:  No  Brady Parameters Mode DDDR     Lower Rate Limit:  60     Upper Rate Limit 120 PAV 180     Sensed  AV Delay:  160  Tachy Zones VF:  230     VT:  200     VT1:  160     Impression & Recommendations:  Problem # 1:  SYSTOLIC HEART FAILURE, CHRONIC (ICD-428.22) He has an ischemic cardiomyopathy with an ejection fraction of 25% and chronic systolic heart failure. He has not had prompt with volume overload appears euvolemic today. This appears stable. We will repeat an echocardiogram to see if his LV function has improved since his upgrade to a biventricular pacemaker. His updated medication list for this problem includes:    Coreg 25 Mg Tabs (Carvedilol) .[name]... 1 by mouth two times a day    Spironolactone 25 Mg Tabs (Spironolactone) .[name]... Take one- half tablet by mouth daily    Aspirin 81 Mg Tbec (Aspirin) .[name]... Take one tablet by mouth daily    Enalapril Maleate 10 Mg Tabs (Enalapril maleate) .[name]... Take 1/2 tablet two times a day  Orders: EKG w/ Interpretation (93000) Echocardiogram (Echo) TLB-BMP (Basic Metabolic Panel-BMET) (80048-METABOL) TLB-CBC Platelet - w/Differential (85025-CBCD)  Problem # 2:  CAD, UNSPECIFIED SITE (ICD-414.00) He had the MI in 1992 treated with PCI. He's had no recent chest pain is from appears stable. His updated medication list for this problem includes:    Coreg 25 Mg Tabs (Carvedilol) .[name]... 1 by mouth two times a day    Aspirin 81 Mg Tbec (Aspirin) .[name]... Take one tablet by mouth daily    Enalapril Maleate 10 Mg Tabs (Enalapril maleate) .[name]... Take 1/2 tablet two times a day  Orders: EKG w/ Interpretation (93000) Echocardiogram (Echo) TLB-BMP (Basic Metabolic Panel-BMET) (80048-METABOL) TLB-CBC Platelet - w/Differential (85025-CBCD)  Problem # 3:  ICD - IN SITU (ICD-V45.02) He has an ICD with a biventricular upgrade in February. He feels his exercise tolerance has improved since that time. We plan a 2-D echo to evaluate his LV function.  Patient Instructions: 1)  Your physician recommends that you continue on your current medications as directed. Please  refer to the Current Medication list given to you today. 2)  Your physician wants you to follow-up in: 6 months with Dr. Ladona Ridgel.  You will receive a reminder letter in the mail two months in advance. If you don't receive a letter, please call our office to schedule the follow-up appointment. 3)  Labwork today: bmet/cbc (414.01;428.22) 4)  Your physician has requested that you have an echocardiogram.  Echocardiography is a painless test that uses sound waves to create images of your heart. It provides your doctor with information about the size and  shape of your heart and how well your heart's chambers and valves are working.  This procedure takes approximately one hour. There are no restrictions for this procedure. Prescriptions: ENALAPRIL MALEATE 10 MG TABS (ENALAPRIL MALEATE) Take 1/2 tablet two times a day  #30 x 11   Entered by:   Sherri Rad, RN, BSN   Authorized by:   Lenoria Farrier, MD, [facility]   Signed by:   Sherri Rad, RN, BSN on 11/04/2009   Method used:   Electronically to        [facility] (retail)       [address]       [place], [place]  [address]       Ph: [phone]       Fax: [phone]   RxID:   [id number] CRESTOR 20 MG TABS (ROSUVASTATIN CALCIUM) Take one tablet by mouth daily. Brand medically necessary #30 x 11   Entered by:   Sherri Rad, RN, BSN   Authorized by:   Lenoria Farrier, MD, [facility]   Signed by:   Sherri Rad, RN, BSN on 11/04/2009   Method used:   Electronically to        [facility] (retail)       [address]       [place], [place]  [id number]       Ph: [phone]       Fax: [phone]   RxID:   [id number] SPIRONOLACTONE 25 MG TABS (SPIRONOLACTONE) Take one- half tablet by mouth daily  #30 x 6   Entered by:   Sherri Rad, RN, BSN   Authorized by:   Lenoria Farrier, MD, [facility]   Signed by:   Sherri Rad, RN, BSN on 11/04/2009    Method used:   Electronically to        [facility] (retail)       [address]       [place], [place]  [address]       Ph: [phone]       Fax: [phone]   RxID:   [id number] COREG 25 MG TABS (CARVEDILOL) 1 by mouth two times a day  #60 x 11   Entered by:   Sherri Rad, RN, BSN   Authorized by:   Lenoria Farrier, MD, [facility]   Signed by:   Sherri Rad, RN, BSN on 11/04/2009   Method used:   Electronically to        [facility] (retail)       [address]       [place], [place]  [address]       Ph: [phone]       Fax: [phone]   RxID:   [id number]

## 2010-02-04 NOTE — Cardiovascular Report (Signed)
Summary: Office Visit Remote   Office Visit Remote   Imported By: Roderic Ovens 01/21/2009 12:05:26  _____________________________________________________________________  External Attachment:    Type:   Image     Comment:   External Document

## 2010-02-04 NOTE — Progress Notes (Signed)
Summary: inthodemicain     Follow-up for Phone Call       Follow-up by: Oswald Hillock,  February 19, 2009 9:13 AM  Additional Follow-up for Phone Call Additional follow up Details #1::       Additional Follow-up by: Burnett Kanaris, CNA,  February 19, 2009 10:37 AM    Additional Follow-up for Phone Call Additional follow up Details #2::    I called Mr Wagster  and he does take indomethician 75mg   After confirming this I called it into pharamcy. Follow-up by: Burnett Kanaris, CNA,  February 19, 2009 10:38 AM  0.

## 2010-02-04 NOTE — Miscellaneous (Signed)
Clinical Lists Changes  Observations: Added new observation of CXR RESULTS:    Findings: A permanent pacemaker is now present with AICD lead.  No   pneumothorax is seen.  Mild cardiomegaly is noted.  There are   degenerative changes in the thoracic spine.    IMPRESSION:   Permanent pacer with AICD lead now present.  No pneumothorax.    Read By:  Juline Patch,  M.D. (02/14/2009 12:31) Added new observation of CARDCATHFIND:  Left circumflex was made predominantly of a large branching ramus.  The   circumflex itself was rather small.  The circumflex again was calcified   in the ostial and proximal portions.  There was a 20-30% stenosis in the   proximal portion of the ramus branch.      Right coronary artery was a dominant vessel that was totally occluded in   the mid section after the takeoff of the RV branch.  The distal right   coronary artery was filled by left-to-left collaterals.      Left ventriculogram shows a dilated left ventricle.  There is akinesis   of the entire inferior wall extending into the apex.  EF is 20-25%.   There appears to be perhaps mild mitral regurgitation.      ASSESSMENT:   1. One-vessel coronary artery disease with totally occluded right       coronary artery.  He does have calcification in the left system,       but no high-grade stenosis.   2. Severe left ventricular dysfunction which does not appear to be       significantly changed from previous.   3. Well-compensated hemodynamics.      Plan will be to continue medical therapy and follow up with Dr. Juanda Chance.               Bevelyn Buckles. Bensimhon, MD  (10/31/2008 12:30) Added new observation of NUCLEAR NOS: QPS  Raw Data Images:  Normal; no motion artifact; normal heart/lung ratio. Stress Images:  Decreased inferior activity Rest Images:  Decreased inferior activity Subtraction (SDS):  IMI Transient Ischemic Dilatation:  1.02  (Normal <1.22)  Lung/Heart Ratio:  .31  (Normal  <0.45)  Quantitative Gated Spect Images  QGS EDV:  278 ml QGS ESV:  227 ml QGS EF:  18 % QGS cine images:  Diffuse hypokinesis with inferior akinesis  Findings  Low risk nuclear study  Evidence for inferior infarct     Overall Impression   Exercise Capacity: Adenosine study with no exercise. BP Response: Normal blood pressure response. Clinical Symptoms: No chest pain ECG Impression: No significant ST segment change suggestive of ischemia. Overall Impression: Large inferior MI from apex to base Overall Impression Comments: large inferior wall MI from apex to base.  ? risk stratify for BiV AICD EF 18%    Signed by Colon Branch, MD, [facility] on 10/22/2008 at 3:19 PM  (10/22/2008 12:29)      Nuclear Study  Procedure date:  10/22/2008  Findings:      QPS  Raw Data Images:  Normal; no motion artifact; normal heart/lung ratio. Stress Images:  Decreased inferior activity Rest Images:  Decreased inferior activity Subtraction (SDS):  IMI Transient Ischemic Dilatation:  1.02  (Normal <1.22)  Lung/Heart Ratio:  .31  (Normal <0.45)  Quantitative Gated Spect Images  QGS EDV:  278 ml QGS ESV:  227 ml QGS EF:  18 % QGS cine images:  Diffuse hypokinesis with inferior akinesis  Findings  Low  risk nuclear study  Evidence for inferior infarct     Overall Impression   Exercise Capacity: Adenosine study with no exercise. BP Response: Normal blood pressure response. Clinical Symptoms: No chest pain ECG Impression: No significant ST segment change suggestive of ischemia. Overall Impression: Large inferior MI from apex to base Overall Impression Comments: large inferior wall MI from apex to base.  ? risk stratify for BiV AICD EF 18%    Signed by Colon Branch, MD, [facility] on 10/22/2008 at 3:19 PM   Cardiac Cath  Procedure date:  10/31/2008  Findings:       Left circumflex was made predominantly of a large branching ramus.  The   circumflex itself was  rather small.  The circumflex again was calcified   in the ostial and proximal portions.  There was a 20-30% stenosis in the   proximal portion of the ramus branch.      Right coronary artery was a dominant vessel that was totally occluded in   the mid section after the takeoff of the RV branch.  The distal right   coronary artery was filled by left-to-left collaterals.      Left ventriculogram shows a dilated left ventricle.  There is akinesis   of the entire inferior wall extending into the apex.  EF is 20-25%.   There appears to be perhaps mild mitral regurgitation.      ASSESSMENT:   1. One-vessel coronary artery disease with totally occluded right       coronary artery.  He does have calcification in the left system,       but no high-grade stenosis.   2. Severe left ventricular dysfunction which does not appear to be       significantly changed from previous.   3. Well-compensated hemodynamics.      Plan will be to continue medical therapy and follow up with Dr. Juanda Chance.               Bevelyn Buckles. Bensimhon, MD   CXR  Procedure date:  02/14/2009  Findings:         Findings: A permanent pacemaker is now present with AICD lead.  No   pneumothorax is seen.  Mild cardiomegaly is noted.  There are   degenerative changes in the thoracic spine.    IMPRESSION:   Permanent pacer with AICD lead now present.  No pneumothorax.    Read By:  Juline Patch,  M.D.

## 2010-02-04 NOTE — Procedures (Signed)
Summary: sjm pacer check   Current Medications (verified): 1)  Coreg 25 Mg Tabs (Carvedilol) .[name].. 1 By Mouth Two Times A Day 2)  Spironolactone 25 Mg Tabs (Spironolactone) .... Take One- Half Tablet By Mouth Daily 3)  Crestor 20 Mg Tabs (Rosuvastatin Calcium) .... Take One Tablet By Mouth Daily. 4)  Aspirin 81 Mg Tbec (Aspirin) .... Take One Tablet By Mouth Daily 5)  Enalapril Maleate 10 Mg Tabs (Enalapril Maleate) .... Take 1/2 Tablet Two Times A Day 6)  Indomethacin Cr 75 Mg Cr-Caps (Indomethacin) .[name].. 1 Tab  As Needed  Allergies (verified): No Known Drug Allergies   Parameters Mode:  DDDR+     Lower Rate Limit:  60     Upper Rate Limit:  130 Paced AV Delay:  300      ICD Specifications Following MD:  Lewayne Bunting, MD     ICD Vendor:  [employer] Jude     ICD Model Number:  [phone]     ICD Serial Number:  [id number] ICD DOI:  02/13/2009     ICD Implanting MD:  Lewayne Bunting, MD  Lead 1:    Location: RA     DOI: 03/21/2001     Model #: 3329     Serial #: [id number]     Status: active Lead 2:    Location: RV     DOI: 03/21/2001     Model #: 6606     Serial #: [id number]     Status: active Lead 3:    Location: LV     DOI: 02/13/2009     Model #: 1158T     Serial #: [id number]     Status: active  Indications::  VT/VF arrest  Explantation Comments: 02/13/2009 [employer] [id number] explanted.  ICD Follow Up Remote Check?  No Charge Time:  8.6 seconds     Battery Est. Longevity:  5.9 years Underlying rhythm:  Brady ICD Dependent:  No       ICD Device Measurements Atrium:  Amplitude: 2.6 mV, Impedance: 390 ohms, Threshold: 1.0 V at 0.6 msec Right Ventricle:  Amplitude: 12 mV, Impedance: 510 ohms, Threshold: 0.75 V at 0.5 msec Left Ventricle:  Impedance: 450 ohms, Threshold: 0.5 V at 0.5 msec Configuration: LV RING TO RV COIL Shock Impedance: 46 ohms   Episodes MS Episodes:  13     Percent Mode Switch:  <1%     Coumadin:  No Shock:  0     ATP:  0     Nonsustained:  0     Atrial Pacing:  89%      Ventricular Pacing:  98%  Brady Parameters Mode DDDR     Lower Rate Limit:  60     Upper Rate Limit 120 PAV 180     Sensed AV Delay:  160  Tachy Zones VF:  230     VT:  200     VT1:  160     Next Cardiology Appt Due:  12/03/2009 Tech Comments:  Quick opt done, IV pace delay to .  PVARP to for noted PMT episodes.  Atrial noise revision noted on 07/25/09 with some oversensing of the noise.  I was not able to reproduce this noise nor could Mr. Elpers remember and electrical exposure that would have caused it.  His atrial lead impedance, p-wave measurement and threshold are all stable although the noise did cause inappropriate mode switch.  There were also true mode switch episodes the longest 14  seconds.  He is not on coumadin.  I will see him back in the [place] office in 3 months.   Altha Harm, LPN  September 11, 2009 9:50 AM

## 2010-02-04 NOTE — Cardiovascular Report (Signed)
Summary: Office Note   Office Note   Imported By: Roderic Ovens 06/16/2009 13:18:25  _____________________________________________________________________  External Attachment:    Type:   Image     Comment:   External Document

## 2010-02-04 NOTE — Procedures (Signed)
Summary: wound check   Current Medications (verified): 1)  Coreg 25 Mg Tabs (Carvedilol) .[name].. 1 By Mouth Two Times A Day 2)  Spironolactone 25 Mg Tabs (Spironolactone) .... Take One- Half Tablet By Mouth Daily 3)  Crestor 20 Mg Tabs (Rosuvastatin Calcium) .... Take One Tablet By Mouth Daily. 4)  Aspirin 81 Mg Tbec (Aspirin) .... Take One Tablet By Mouth Daily 5)  Enalapril Maleate 10 Mg Tabs (Enalapril Maleate) .... Take 1/2 Tablet Two Times A Day 6)  Indomethacin Cr 75 Mg Cr-Caps (Indomethacin) .[name].. 1 Tab  As Needed  Allergies (verified): No Known Drug Allergies   Parameters Mode:  DDDR+     Lower Rate Limit:  60     Upper Rate Limit:  130 Paced AV Delay:  300      ICD Specifications Following MD:  Lewayne Bunting, MD     ICD Vendor:  St Jude     ICD Model Number:  [id number]     ICD Serial Number:  [id number] ICD DOI:  02/13/2009     ICD Implanting MD:  Lewayne Bunting, MD  Lead 1:    Location: RA     DOI: 03/21/2001     Model #: 8119     Serial #: [id number]     Status: active Lead 2:    Location: RV     DOI: 03/21/2001     Model #: 5621     Serial #: [id number]     Status: active Lead 3:    Location: LV     DOI: 02/13/2009     Model #: 1158T     Serial #: [id number]     Status: active  Indications::  VT/VF arrest  Explantation Comments: 02/13/2009 [facility] Scientific Prizm [id number] explanted.  ICD Follow Up Remote Check?  No Battery Voltage:  >95% V     Charge Time:  7.9 seconds     Battery Est. Longevity:  6.9 YEARS Underlying rhythm:  SR ICD Dependent:  No       ICD Device Measurements Atrium:  Amplitude: 5.0 mV, Impedance: 400 ohms, Threshold: 1.0 V at 0.6 msec Right Ventricle:  Amplitude: 12 mV, Impedance: 530 ohms, Threshold: 0.75 V at 0.5 msec Left Ventricle:  Impedance: 390 ohms, Threshold: 1.0 V at 0.5 msec Configuration: LV RING TO RV COIL Shock Impedance: 46 ohms   Episodes MS Episodes:  2     Percent Mode Switch:  <1%     Coumadin:  No Shock:  0     ATP:  0     Nonsustained:  0      Atrial Pacing:  86%     Ventricular Pacing:  98%  Brady Parameters Mode DDDR     Lower Rate Limit:  60     Upper Rate Limit 120 PAV 180     Sensed AV Delay:  160  Tachy Zones VF:  230     VT:  200     VT1:  160     Next Cardiology Appt Due:  06/03/2009 Tech Comments:  Wound check appt. Steri-strips removed.  Wound without redness or edema.  Normal device function.  No changes made today.  Histagrams flat, but pt reports feeling better since upgrade.  Will re-evaluate at 3 month visit with Dr Ladona Ridgel.  Both mode switch episodes less than 1 minute.  ROV 3 months GT. Gypsy Balsam RN BSN  March 05, 2009 9:13 AM  MD Comments:  Agree with above.

## 2010-02-04 NOTE — Letter (Signed)
Summary: Appointment - Reminder 2  [employer], Main Office  [address]   [place], [place] [address]   Phone: [phone]  Fax: [phone]     February 25, 2009 MRN: [id number]   BLAYZE HAEN [address] [place], [place]  [address]   Dear Mr. Pilgrim,  Our records indicate that you need to schedule an appointment to have your wound checked from your pacer implantation. It is very important that we reach you to schedule this appointment. We look forward to participating in your health care needs. Please contact us at the number listed above at your earliest convenience to schedule your appointment.  If you are unable to make an appointment at this time, give [name] a call so we can update our records.     Sincerely,  Ruel Favors Scheduling Team

## 2010-02-04 NOTE — Cardiovascular Report (Signed)
Summary: Office Visit   Office Visit   Imported By: Roderic Ovens 09/29/2009 15:15:33  _____________________________________________________________________  External Attachment:    Type:   Image     Comment:   External Document

## 2010-02-04 NOTE — Assessment & Plan Note (Signed)
Summary: ROV   Visit Type:    Follow-up Primary Provider:  none   History of Present Illness: The patient is 77 years old and return for management of CAD and CHF. He had an inferior MI in 1992. In 2003 he had ventricular fibrillation and underwent implantation of an ICD. Ejection fraction was 25% and left bundle branch block. He quite well over a number of years.  He says he is feeling quite well. He says he has more energy since he had the new device. He's had no chest pain or palpitations. His other problems include hypertension hyperlipidemia.  No intercurrent ICD therapies.  Current Medications (verified): 1)  Coreg 25 Mg Tabs (Carvedilol) .[name].. 1 By Mouth Two Times A Day 2)  Spironolactone 25 Mg Tabs (Spironolactone) .... Take One- Half Tablet By Mouth Daily 3)  Crestor 20 Mg Tabs (Rosuvastatin Calcium) .... Take One Tablet By Mouth Daily. 4)  Aspirin 81 Mg Tbec (Aspirin) .... Take One Tablet By Mouth Daily 5)  Enalapril Maleate 10 Mg Tabs (Enalapril Maleate) .... Take 1/2 Tablet Two Times A Day 6)  Indomethacin Cr 75 Mg Cr-Caps (Indomethacin) .[name].. 1 Tab  As Needed  Allergies (verified): No Known Drug Allergies  Past History:  Past Medical History: Last updated: 06/09/2008 HISTORY OF SUDDEN CARDIAC ARREST (ICD-V12.53) LBBB (ICD-426.3) CAD, UNSPECIFIED SITE (ICD-414.00) ICD - IN SITU (ICD-V45.02) Hx of VENTRICULAR TACHYCARDIA (ICD-427.1) Hx of VENTRICULAR FIBRILLATION (ICD-427.41) ISCHEMIC CARDIOMYOPATHY (ICD-414.8) HYPERTENSION, UNSPECIFIED (ICD-401.9) DYSLIPIDEMIA (ICD-272.4) 1. Coronary artery disease status post remote diaphragmatic wall     infarction and percutaneous coronary intervention in 1992. 2. Ischemic cardiomyopathy with ejection fraction 25%. 3. Class II to III congestive heart failure. 4. Status post Guidant dual chamber defibrillator programmed to     ventricular demand pacing. 5. Left bundle-branch block. 6. Hypertension. 7. Hyperlipidemia.   Review  of Systems  The patient denies chest pain, syncope, dyspnea on exertion, and peripheral edema.    Vital Signs:  Patient profile:   77 year old male Height:      68 inches Weight:      192 pounds BMI:     29.30 Pulse rate:   59 / minute BP sitting:   96 / 60  (left arm)  Vitals Entered By: Laurance Flatten CMA (June 12, 2009 8:29 AM)  Physical Exam  General:  Elderly, well developed, well nourished, in no acute distress.  HEENT: normal Neck: supple. 7 cm  JVD. Carotids 2+ bilaterally no bruits Cor: RRR with a split S2. No rubs, gallops or murmur.  PMI is enlarged and laterally displaced. Lungs: CTA except rales in the bases. Well healed ICD incision. Ab: soft, nontender. nondistended. No HSM. Good bowel sounds Ext: warm. no cyanosis, clubbing. Trace peripheral edema Neuro: alert and oriented. Grossly nonfocal. affect pleasant     Parameters Mode:  DDDR+     Lower Rate Limit:  60     Upper Rate Limit:  130 Paced AV Delay:  300      ICD Specifications Following MD:  Lewayne Bunting, MD     ICD Vendor:  St Jude     ICD Model Number:  [id number]     ICD Serial Number:  [id number] ICD DOI:  02/13/2009     ICD Implanting MD:  Lewayne Bunting, MD  Lead 1:    Location: RA     DOI: 03/21/2001     Model #: 0865     Serial #: [id number]     Status: active  Lead 2:    Location: RV     DOI: 03/21/2001     Model #: 9147     Serial #: [id number]     Status: active Lead 3:    Location: LV     DOI: 02/13/2009     Model #: 1158T     Serial #: [id number]     Status: active  Indications::  VT/VF arrest  Explantation Comments: 02/13/2009 [facility] Scientific Prizm [id number] explanted.  ICD Follow Up Battery Voltage:  94% V     Charge Time:  7.9 seconds     Battery Est. Longevity:  6.28yrs Underlying rhythm:  SINUS BRADY @ 50 ICD Dependent:  No       ICD Device Measurements Atrium:  Amplitude: 5.0 mV, Impedance: 390 ohms, Threshold: 1.0 V at 0.6 msec Right Ventricle:  Amplitude: 12.0 mV, Impedance: 540 ohms, Threshold:  0.75 V at 0.5 msec Left Ventricle:  Impedance: 480 ohms, Threshold: 1.0 V at 0.5 msec Configuration: LV RING TO RV COIL Shock Impedance: 48 ohms   Episodes MS Episodes:  11     Percent Mode Switch:  <1%     Coumadin:  No Shock:  0     ATP:  0     Nonsustained:  0     Atrial Therapies:  0 Atrial Pacing:  90%     Ventricular Pacing:  98%  Brady Parameters Mode DDDR     Lower Rate Limit:  60     Upper Rate Limit 120 PAV 180     Sensed AV Delay:  160  Tachy Zones VF:  230     VT:  200     VT1:  160     Next Cardiology Appt Due:  09/03/2009 Tech Comments:  11 PMT EPISODES CAUSED BY BOTH PVCs AND PACs.  11 MODE SWITCHES--LONGEST WAS 10 SECONDS.  NORMAL THRESHOLD TESTING.  NO CHANGES MADE. ROV IN 3 MTHS IN [place]. Vella Kohler  June 12, 2009 9:10 AM MD Comments:  Agree with above.  Impression & Recommendations:  Problem # 1:  ICD - IN SITU (ICD-V45.02) His device is working normally.  Will recheck in several months.  Problem # 2:  SYSTOLIC HEART FAILURE, CHRONIC (ICD-428.22) He remains class 1-2.  Continue a low sodium diet and meds as below. His updated medication list for this problem includes:    Coreg 25 Mg Tabs (Carvedilol) .[name]... 1 by mouth two times a day    Spironolactone 25 Mg Tabs (Spironolactone) .[name]... Take one- half tablet by mouth daily    Aspirin 81 Mg Tbec (Aspirin) .[name]... Take one tablet by mouth daily    Enalapril Maleate 10 Mg Tabs (Enalapril maleate) .[name]... Take 1/2 tablet two times a day  Problem # 3:  Hx of VENTRICULAR TACHYCARDIA (ICD-427.1) His VT has remained stable.  Will followup with no change in meds. His updated medication list for this problem includes:    Coreg 25 Mg Tabs (Carvedilol) .[name]... 1 by mouth two times a day    Aspirin 81 Mg Tbec (Aspirin) .[name]... Take one tablet by mouth daily    Enalapril Maleate 10 Mg Tabs (Enalapril maleate) .[name]... Take 1/2 tablet two times a day  Patient Instructions: 1)  Your physician wants you to follow-up in:  1 YEAR with  Dr Ladona Ridgel.  You will receive a reminder letter in the mail two months in advance. If you don't receive a letter, please call our office to schedule the follow-up appointment.

## 2010-02-04 NOTE — Assessment & Plan Note (Signed)
Summary: defib check/gdt/amber   Visit Type:  Follow-up Primary Provider:  none   History of Present Illness: Mark Nguyen returns today for followup.  He is a pleasant 77 yo man with a h/o VF arrest, VT, ICM and CHF.  He has class 2-3 symptoms and LBBB with a QRS duration of 190 ms.  He has had no recent ICD shocks.  He has peripheral edema.  No other complaints.  Current Medications (verified): 1)  Coreg 25 Mg Tabs (Carvedilol) .[name].. 1 By Mouth Two Times A Day 2)  Spironolactone 25 Mg Tabs (Spironolactone) .... Take One- Half Tablet By Mouth Daily 3)  Crestor 20 Mg Tabs (Rosuvastatin Calcium) .... Take One Tablet By Mouth Daily. 4)  Aspirin 81 Mg Tbec (Aspirin) .... Take One Tablet By Mouth Daily 5)  Indomethacin 50 Mg Caps (Indomethacin) .... As Needed 6)  Enalapril Maleate 10 Mg Tabs (Enalapril Maleate) .... Take 1/2 Tablet Two Times A Day  Allergies (verified): No Known Drug Allergies  Past History:  Past Medical History: Last updated: 06/09/2008 HISTORY OF SUDDEN CARDIAC ARREST (ICD-V12.53) LBBB (ICD-426.3) CAD, UNSPECIFIED SITE (ICD-414.00) ICD - IN SITU (ICD-V45.02) Hx of VENTRICULAR TACHYCARDIA (ICD-427.1) Hx of VENTRICULAR FIBRILLATION (ICD-427.41) ISCHEMIC CARDIOMYOPATHY (ICD-414.8) HYPERTENSION, UNSPECIFIED (ICD-401.9) DYSLIPIDEMIA (ICD-272.4) 1. Coronary artery disease status post remote diaphragmatic wall     infarction and percutaneous coronary intervention in 1992. 2. Ischemic cardiomyopathy with ejection fraction 25%. 3. Class II to III congestive heart failure. 4. Status post Guidant dual chamber defibrillator programmed to     ventricular demand pacing. 5. Left bundle-branch block. 6. Hypertension. 7. Hyperlipidemia.   Review of Systems       The patient complains of dyspnea on exertion and peripheral edema.  The patient denies chest pain and syncope.    Vital Signs:  Patient profile:   77 year old male Height:      68 inches Weight:      198  pounds BMI:     30.21 Pulse rate:   56 / minute BP sitting:   158 / 80  (left arm)  Vitals Entered By: Mark Nguyen CMA (January 27, 2009 2:55 PM)  Physical Exam  General:  Elderly, well developed, well nourished, in no acute distress.  HEENT: normal Neck: supple. 7 cm  JVD. Carotids 2+ bilaterally no bruits Cor: RRR with a split S2. No rubs, gallops or murmur.  PMI is enlarged and laterally displaced. Lungs: CTA except rales in the bases. Ab: soft, nontender. nondistended. No HSM. Good bowel sounds Ext: warm. no cyanosis, clubbing. Trace peripheral edema Neuro: alert and oriented. Grossly nonfocal. affect pleasant    EKG  Procedure date:  11/13/2008  Findings:      Normal sinus rhythm with rate of: 70. Left bundle branch block. QRS 190.    Parameters Mode:  DDDR+     Lower Rate Limit:  60     Upper Rate Limit:  130 Paced AV Delay:  300      ICD Specifications Following MD:  Lewayne Bunting, MD     ICD Vendor:  [employer]     ICD Model Number:  [phone]     ICD Serial Number:  [id number] ICD DOI:  03/21/2001      Lead 1:    Location: RA     DOI: 03/21/2001     Model #: 1478     Serial #: [id number]     Status: active Lead 2:    Location: RV  DOI: 03/21/2001     Model #: 1610     Serial #: [id number]     Status: active  Indications::  VT/VF arrest  Explantation Comments: [facility]  ICD Follow Up Remote Check?  No Battery Voltage:  2.48 V     Charge Time:  14.6 seconds     Battery Est. Longevity:  ERI Underlying rhythm:  SB ICD Dependent:  No       ICD Device Measurements Atrium:  Amplitude: 6.1 mV, Impedance: 559 ohms, Threshold: 1.2 V at 0.4 msec Right Ventricle:  Amplitude: 20.9 mV, Impedance: 568 ohms, Threshold: 0.8 V at 0.4 msec Shock Impedance: 47 ohms   Episodes MS Episodes:  0     Coumadin:  No Shock:  0     ATP:  0     Nonsustained:  0     Atrial Pacing:  0%     Ventricular Pacing:  0%  Brady Parameters Mode VVI     Lower Rate Limit:  40     Upper Rate Limit  40  Tachy Zones VF:  210     VT:  160     Tech Comments:  Device at [employer].  Leads stable.  Last therapy in 2007, VT treated with ATP.  No changes made today.  Plan for change out per Dr Ladona Ridgel. Gypsy Balsam RN BSN  January 27, 2009 3:04 PM  MD Comments:  Agree with above.  Impression & Recommendations:  Problem # 1:  ICD - IN SITU (ICD-V45.02) The patient's device has reached ERI.  He has class 2-3 CHF despite maximal medical therapy and LBBB with a QRS duration of 190 ms.  I will schedule upgrade to a BiV ICD.  Problem # 2:  SYSTOLIC HEART FAILURE, CHRONIC (ICD-428.22) His CHF is class 2-3.  He will continue his current meds and maintain a low sodium diet.   His updated medication list for this problem includes:    Coreg 25 Mg Tabs (Carvedilol) .[name]... 1 by mouth two times a day    Spironolactone 25 Mg Tabs (Spironolactone) .[name]... Take one- half tablet by mouth daily    Aspirin 81 Mg Tbec (Aspirin) .[name]... Take one tablet by mouth daily    Enalapril Maleate 10 Mg Tabs (Enalapril maleate) .[name]... Take 1/2 tablet two times a day  Problem # 3:  CAD, UNSPECIFIED SITE (ICD-414.00) He denies anginal symptoms at present. His updated medication list for this problem includes:    Coreg 25 Mg Tabs (Carvedilol) .[name]... 1 by mouth two times a day    Aspirin 81 Mg Tbec (Aspirin) .[name]... Take one tablet by mouth daily    Enalapril Maleate 10 Mg Tabs (Enalapril maleate) .[name]... Take 1/2 tablet two times a day

## 2010-02-04 NOTE — Progress Notes (Signed)
Summary: ICD at [facility]  Phone Note Outgoing Call Call back at [facility] Phone [phone]   Call placed by: Gypsy Balsam RN BSN,  January 15, 2009 10:50 AM Summary of Call: ICD at [facility] per latitude transmission.  ROV for patient scheduled for 01-27-09 with Dr Ladona Ridgel.  Pt aware and agrees with plan. Gypsy Balsam RN BSN  January 15, 2009 10:50 AM

## 2010-02-04 NOTE — Assessment & Plan Note (Signed)
Summary: Mark Nguyen   Visit Type:  Follow-up Primary Provider:  none  CC:  no cardaic complaints.  History of Present Illness: The patient is 77 years old and return for management of CAD and CHF. He had an inferior MI in 1992. In 2003 he had ventricular fibrillation and underwent implantation of an ICD. Ejection fraction was 25% and left bundle branch block. He quite well over a number of years. Recently his ICD reached end-of-life and he underwent a replacement and his device was upgraded to a biventricular device.  He says he is feeling quite well. He says he has more energy since he had the new device. He's had no chest pain or palpitations.  His other problems include hypertension hyperlipidemia.  Current Medications (verified): 1)  Coreg 25 Mg Tabs (Carvedilol) .[name].. 1 By Mouth Two Times A Day 2)  Spironolactone 25 Mg Tabs (Spironolactone) .... Take One- Half Tablet By Mouth Daily 3)  Crestor 20 Mg Tabs (Rosuvastatin Calcium) .... Take One Tablet By Mouth Daily. 4)  Aspirin 81 Mg Tbec (Aspirin) .... Take One Tablet By Mouth Daily 5)  Enalapril Maleate 10 Mg Tabs (Enalapril Maleate) .... Take 1/2 Tablet Two Times A Day 6)  Indomethacin Cr 75 Mg Cr-Caps (Indomethacin) .[name].. 1 Tab  As Needed  Allergies (verified): No Known Drug Allergies  Past History:  Past Medical History: Reviewed history from 06/09/2008 and no changes required. HISTORY OF SUDDEN CARDIAC ARREST (ICD-V12.53) LBBB (ICD-426.3) CAD, UNSPECIFIED SITE (ICD-414.00) ICD - IN SITU (ICD-V45.02) Hx of VENTRICULAR TACHYCARDIA (ICD-427.1) Hx of VENTRICULAR FIBRILLATION (ICD-427.41) ISCHEMIC CARDIOMYOPATHY (ICD-414.8) HYPERTENSION, UNSPECIFIED (ICD-401.9) DYSLIPIDEMIA (ICD-272.4) 1. Coronary artery disease status post remote diaphragmatic wall     infarction and percutaneous coronary intervention in 1992. 2. Ischemic cardiomyopathy with ejection fraction 25%. 3. Class II to III congestive heart failure. 4. Status post  Guidant dual chamber defibrillator programmed to     ventricular demand pacing. 5. Left bundle-branch block. 6. Hypertension. 7. Hyperlipidemia.   Review of Systems       ROS is negative except as outlined in HPI.   Vital Signs:  Patient profile:   77 year old male Height:      68 inches Weight:      194 pounds BMI:     29.60 Pulse rate:   60 / minute BP sitting:   125 / 77  (left arm) Cuff size:   large  Vitals Entered By: Burnett Kanaris, CNA (May 15, 2009 8:25 AM)  Physical Exam  Additional Exam:  Gen. Well-nourished, in no distress   Neck: No JVD, thyroid not enlarged, no carotid bruits Lungs: No tachypnea, clear without rales, rhonchi or wheezes Cardiovascular: Rhythm regular, PMI not displaced,  heart sounds  normal, no murmurs or gallops, no peripheral edema, pulses normal in all 4 extremities. Abdomen: BS normal, abdomen soft and non-tender without masses or organomegaly, no hepatosplenomegaly. MS: No deformities, no cyanosis or clubbing   Neuro:  No focal sns   Skin:  no lesions     Parameters Mode:  DDDR+     Lower Rate Limit:  60     Upper Rate Limit:  130 Paced AV Delay:  300      ICD Specifications Following MD:  Lewayne Bunting, MD     ICD Vendor:  St Jude     ICD Model Number:  [id number]     ICD Serial Number:  [id number] ICD DOI:  02/13/2009     ICD Implanting MD:  Lewayne Bunting, MD  Lead 1:    Location: RA     DOI: 03/21/2001     Model #: 1610     Serial #: [id number]     Status: active Lead 2:    Location: RV     DOI: 03/21/2001     Model #: 0981     Serial #: [id number]     Status: active Lead 3:    Location: LV     DOI: 02/13/2009     Model #: 1158T     Serial #: [id number]     Status: active  Indications::  VT/VF arrest  Explantation Comments: 02/13/2009 [facility] Scientific Prizm [id number] explanted.  ICD Follow Up ICD Dependent:  No       ICD Device Measurements Configuration: LV RING TO RV COIL  Episodes Coumadin:  No  Brady Parameters Mode DDDR     Lower  Rate Limit:  60     Upper Rate Limit 120 PAV 180     Sensed AV Delay:  160  Tachy Zones VF:  230     VT:  200     VT1:  160     Impression & Recommendations:  Problem # 1:  SYSTOLIC HEART FAILURE, CHRONIC (ICD-428.22) He appears euvolemic today. He is on a good medical program. The thumb appears stable. His updated medication list for this problem includes:    Coreg 25 Mg Tabs (Carvedilol) .[name]... 1 by mouth two times a day    Spironolactone 25 Mg Tabs (Spironolactone) .[name]... Take one- half tablet by mouth daily    Aspirin 81 Mg Tbec (Aspirin) .[name]... Take one tablet by mouth daily    Enalapril Maleate 10 Mg Tabs (Enalapril maleate) .[name]... Take 1/2 tablet two times a day  Orders: EKG w/ Interpretation (93000)  Problem # 2:  CAD, UNSPECIFIED SITE (ICD-414.00) He has CAD catheterization and October 2010 which showed total occlusion of right could not and nonobstructive disease in the left system. He's had no chest pain this problem appears stable. His updated medication list for this problem includes:    Coreg 25 Mg Tabs (Carvedilol) .[name]... 1 by mouth two times a day    Aspirin 81 Mg Tbec (Aspirin) .[name]... Take one tablet by mouth daily    Enalapril Maleate 10 Mg Tabs (Enalapril maleate) .[name]... Take 1/2 tablet two times a day  Orders: EKG w/ Interpretation (93000)  Problem # 3:  ICD - IN SITU (ICD-V45.02) He recently had replacement of his ICD and upgrade to a biventricular pacing system. He says his exercise tolerance has improved.  Patient Instructions: 1)  Your physician recommends that you continue on your current medications as directed. Please refer to the Current Medication list given to you today. 2)  Your physician wants you to follow-up in:  6 months. You will receive a reminder letter in the mail two months in advance. If you don't receive a letter, please call our office to schedule the follow-up appointment.

## 2010-02-04 NOTE — Letter (Signed)
Summary: Implantable Device Instructions  [facility], Main Office  [address]   [place], [place] [address]   Phone: [phone]  Fax: [phone]      Implantable Device Instructions  You are scheduled for:  Bi-V ICD upgrade  on 02/13/2009 with Dr. Ladona Ridgel  1.  Please arrive at the Short Stay Center at [facility] at 6:00am on the day of your procedure.  2.  Do not eat or drink after midnight the night before your procedure.  3.  Complete lab work on 02/06/09  The lab at [facility] is open from 8:30 AM to 1:30 PM and from 2:30 PM to 5:00 PM.  The lab at [facility] is open from 7:30 AM to 5:30 PM.  You do not have to be fasting  4.  Plan for an overnight stay.  Bring your insurance cards and a list of your medications.  5.  Wash your chest and neck with antibacterial soap (any brand) the evening before and the morning of your procedure.  Rinse well.   *If you have ANY questions after you get home, please call the office [phone]. Anselm Pancoast  *Every attempt is made to prevent procedures from being rescheduled.  Due to the nauture of Electrophysiology, rescheduling can happen.  The physician is always aware and directs the staff when this occurs.

## 2010-04-08 LAB — POCT I-STAT 3, VENOUS BLOOD GAS (G3P V)
Acid-base deficit: 3 mmol/L — ABNORMAL HIGH (ref 0.0–2.0)
Bicarbonate: 22.4 mEq/L (ref 20.0–24.0)
Bicarbonate: 25.2 mEq/L — ABNORMAL HIGH (ref 20.0–24.0)
O2 Saturation: 70 %
TCO2: 27 mmol/L (ref 0–100)
pCO2, Ven: 44.9 mmHg — ABNORMAL LOW (ref 45.0–50.0)
pH, Ven: 7.357 — ABNORMAL HIGH (ref 7.250–7.300)
pO2, Ven: 37 mmHg (ref 30.0–45.0)
pO2, Ven: 38 mmHg (ref 30.0–45.0)

## 2010-04-08 LAB — POCT I-STAT 3, ART BLOOD GAS (G3+)
pCO2 arterial: 37.6 mmHg (ref 35.0–45.0)
pH, Arterial: 7.383 (ref 7.350–7.450)

## 2010-05-18 NOTE — Assessment & Plan Note (Signed)
[facility]                         ELECTROPHYSIOLOGY OFFICE NOTE   MATEEN, FRANSSEN                       MRN:          [id number]  DATE:03/25/2008                            DOB:          1931/03/30    Mr. Anaya returns today for followup.  He is a pleasant middle-aged  male with a history of VF arrest in the past, status post ICD insertion.  He returns today for followup.  He has known coronary artery disease.  He denies chest pain or shortness of breath at present.  He continues to  be quite active and tells me that he has recently been fishing a bunch.   MEDICATIONS:  1. Carvedilol 25 twice a day.  2. Aldactone 12.5 daily.  3. Crestor 20 a day.  4. Enalapril 10 twice a day.  5. Furosemide 40 mg half tablet daily.  6. Aspirin 81 mg a day.   PHYSICAL EXAMINATION:  GENERAL:  He is a pleasant man in no acute  distress.  VITAL SIGNS:  Blood pressure is 123/65, pulse 65 and regular,  respirations were 18, and the weight was 201 pounds.  NECK:  No jugular vein distention.  LUNGS:  Clear bilaterally to auscultation.  No wheezes, rales, or  rhonchi are present.  No increased work of breathing.  CARDIOVASCULAR:  Regular rate and rhythm.  Normal S1 and S2.  ABDOMEN:  Soft and nontender.  EXTREMITIES:  No edema.   Interrogation of his defibrillator demonstrates a [employer].  The R-  waves were 13, the P-waves were 6, the impedance 579 in the atrium and  620 in the ventricle, a threshold 0.6 volts at 0.4 milliseconds.  Battery voltage was 2.57 volts.   IMPRESSION:  1. Coronary artery disease.  2. Status post ventricular tachycardia, ventricular fibrillation      arrest.  3. Status post implantable cardioverter-defibrillator insertion.   DISCUSSION:  Overall, Mr. Barringer is stable.  His heart failure is class  I.  He is having no anginal symptoms at present.  He denies any  intercurrent ICD therapies.  We will plan to see the patient back  in the  office for followup in 1 year.  He will follow up in our [facility] as well.    Doylene Canning. Ladona Ridgel, MD  Electronically Signed   GWT/MedQ  DD: 03/25/2008  DT: 03/26/2008  Job #: [id number]

## 2010-05-18 NOTE — Assessment & Plan Note (Signed)
[facility]                            CARDIOLOGY OFFICE NOTE   DALTON, MOLESWORTH                       MRN:          [id number]  DATE:01/24/2008                            DOB:          05-05-1931    PRIMARY CARE PHYSICIAN:  Barbette Hair. Artist Pais, DO.   ELECTROPHYSIOLOGIST:  Doylene Canning. Ladona Ridgel, MD   CLINICAL HISTORY:  Mark Nguyen is 77 year old and returns for a followup  management of his coronary heart disease and ischemic cardiomyopathy.  He had a remote diaphragmatic wall infarction and has had prior PCI.  He  has ischemic cardiomyopathy, ejection fraction of 20-25%.  He has a dual-  chamber Guidant defibrillator, which is programmed to VVI backup at 40.  He also has chronic left bundle-branch block.   He had been doing fairly well.  He had no chest pain or palpitations.  He does say he gets short of breath when he walks up a hill and that his  exercise tolerance may not be as good as it has been in the past.   PAST MEDICAL HISTORY:  Significant for hypertension and hyperlipidemia.   CURRENT MEDICATIONS:  Include  1. Carvedilol 25 mg b.i.d.  2. Spironolactone 25 mg one-half tablet daily.  3. Crestor 20 mg daily.  4. Enalapril 10 mg b.i.d.  5. Furosemide 40 mg one-half tablet daily.  6. Aspirin 81 mg daily.  7. He had been on warfarin as part of the WARCEF trial, but he stopped      this and converted to aspirin when the trial was finished.   PHYSICAL EXAMINATION:  VITAL SIGNS:  Blood pressure is 132/72 and pulse  55 and regular.  NECK:  There is no venous distension.  The carotid pulses are full  without bruits.  CHEST:  Clear.  CARDIAC:  Rhythm was regular.  There are no murmurs or gallops.  ABDOMEN:  Soft, normal bowel sounds.  EXTREMITIES:  Peripheral pulses are full with no peripheral edema.   An electrocardiogram showed left bundle-branch block and sinus  bradycardia at 56.   IMPRESSION:  1. Coronary artery disease status post remote  diaphragmatic wall      infarction and percutaneous coronary intervention in 1992.  2. Ischemic cardiomyopathy with ejection fraction 25%.  3. Class II to III congestive heart failure.  4. Status post Guidant dual chamber defibrillator programmed to      ventricular demand pacing.  5. Left bundle-branch block.  6. Hypertension.  7. Hyperlipidemia.   RECOMMENDATIONS:  I think, Mr. Rigel is doing well, I think his  exercise tolerance is decreased some and he probably is increased from  class I to II to class II to III systolic heart failure.  I will plan to  increase his spironolactone from 12.5 to 25 daily and we will check BNP  in a week.  We will also check a lipid and liver.  I think he has about  20% left on his ICD when he is due for change out, I think we probably  should upgrade him up to a BiV pacer.  He has  a left bundle-branch block  with QRS duration of 200 milliseconds.     Bruce Elvera Lennox Juanda Chance, MD, [facility]  Electronically Signed    BRB/MedQ  DD: 01/24/2008  DT: 01/25/2008  Job #: [id number]

## 2010-05-18 NOTE — Assessment & Plan Note (Signed)
[facility]                            CARDIOLOGY OFFICE NOTE   Mark Nguyen                       MRN:          [id number]  DATE:06/21/2006                            DOB:          November 23, 1931    PRIMARY CARE PHYSICIAN:  Dr. Dondra Spry.   CLINICAL HISTORY:  Mark Nguyen is 77 years old and has had a previous  diaphragmatic wall infarction, treated with PCI, and has an ischemic  cardiomyopathy and ejection fraction of 21%.  He has a dual-chamber  Guidant defibrillator, which is programmed to VVI at a back-up rate of  40.  He had previously had a V-fib arrest in 2003.   He has done quite well and has had no recent chest pain or palpitations.  He says his exercise tolerance is good and he can do most of his normal  activities without shortness of breath.   PAST MEDICAL HISTORY:  Significant for hypertension and hyperlipidemia.   CURRENT MEDICATIONS INCLUDE:  1. Coreg.  2. Furosemide.  3. Spironolactone.  4. Crestor.  5. __________.  6. Enalapril.   ON EXAMINATION:  The blood pressure is 108/61, the pulse 43 and regular.  There was no vein distention.  The carotid pulses were full without  bruits.  The chest was clear without rales or rhonchi.  Cardiac rhythm  was regular.  He had no murmurs or gallops.  Abdomen was soft without  organomegaly.  Peripheral pulses were full and there was no peripheral  edema.   An electrocardiogram showed left bundle branch block and sinus  bradycardia with a rate of 44.   IMPRESSION:  1. Coronary artery disease, status post prior diaphragmatic wall      infarction, treated with PTCA in 1992.  2. Ischemic cardiomyopathy, ejection fraction of 21%.  3. Class I-II congestive heart failure.  4. Status post Guidant dual-chamber ICD, implanted in 2003, for a V-      fib arrest, programmed to back up VVI mode.  5. Left bundle branch block.  6. Hypertension.  7. Hyperlipidemia.   RECOMMENDATIONS:  I think  [name] is doing quite well.  His pulse  rate is slow, but he had had no symptoms of dizziness.  He says his  pulse rate normally runs 60 to the 50s, but not usually in the 40s.  I  think I will leave his Coreg at 25 unless he develops symptoms of  dizziness, which we cautioned him about.  He has left bundle branch  block and ejection fraction of 21%, but is only Class I-II congestive  heart failure, so he does not have indications for CRT.  We had  evaluated him with CPX testing in the past.  He did fairly well.  We  will get laboratory work, including a CBC and BMP today, and I will see  him back in a year.  He is getting defibrillator checks on a weekly  basis by phone.     Mark Elvera Lennox Juanda Chance, MD, [facility]  Electronically Signed    BRB/MedQ  DD: 06/21/2006  DT: 06/21/2006  Job #:  [id number]   cc:   Barbette Hair. Artist Pais, DO

## 2010-05-18 NOTE — Assessment & Plan Note (Signed)
[facility]                            CARDIOLOGY OFFICE NOTE   Mark Nguyen, Mark Nguyen                       MRN:          [id number]  DATE:11/28/2006                            DOB:          Apr 11, 1931    PRIMARY CARE PHYSICIAN:  Barbette Hair. Artist Pais, DO.   CLINICAL HISTORY:  Mark Nguyen is 77 years old and has coronary artery  disease with a previous diaphragmatic myocardial infarction treated with  PCI and has ischemic cardiomyopathy with last ejection fraction in 2006  by Myoview scan of 21%.  He has a dual-chamber Guidant defibrillator  programmed to VVI backup of 40.   He has done quite well with no recent chest pain or palpitations.  He  has minimal shortness of breath.   PAST MEDICAL HISTORY:  Significant for hypertension and hyperlipidemia.   CURRENT MEDICATIONS:  1. Coreg.  2. Enalapril.  3. WARCEF study drug.  4. Crestor.  5. Furosemide.   PHYSICAL EXAMINATION:  VITAL SIGNS:  Blood pressure 104/67, pulse 49 and  regular.  NECK:  There was no venous distention.  The carotid pulses were full  without bruits.  CHEST:  Clear.  CARDIAC:  Rhythm was regular.  I could not hear no murmurs or gallops.  ABDOMEN:  Soft with normal bowel sounds.  There is no  hepatosplenomegaly.  EXTREMITIES:  There is no peripheral edema.  Pedal pulses are equal.   We interrogated his pacemaker and he is programmed at VVI backup of 40.  He has had no episodes of ventricular tachycardia.  No treatments.  Threshold in both leads were good.   IMPRESSION:  1. Coronary artery disease status post prior diaphragmatic myocardial      infarction, treated with percutaneous transluminal coronary      angioplasty in 1992.  2. Ischemic cardiomyopathy with ejection fraction of 21%.  3. Class I-II congestive heart failure.  4. Status post Guidant dual-chamber implantable cardioverter-      defibrillator implanted 2003 following ventricular fibrillation      arrest programmed  to VVI with backup mode.  5. Left bundle branch block.  6. Hypertension.  7. Hyperlipidemia.   DIAGNOSTICS:  His ECG today showed left bundle branch block.   RECOMMENDATIONS:  I think Mark Nguyen is doing quite well.  We refilled  his prescription today.  I gave him a prescription for Indocin which he  knows not to take on a regular basis.  He has not had an evaluation of  his LV function since 2006, so we will get a 2-D echo.  We will get  fasting lab work including a lipid level, CBC and BMP.   FOLLOW UP:  I will see him back in followup in 6 months.     Bruce Elvera Lennox Juanda Chance, MD, [facility]  Electronically Signed    BRB/MedQ  DD: 11/28/2006  DT: 11/28/2006  Job #: [id number]   cc:   Barbette Hair. Artist Pais, DO

## 2010-05-18 NOTE — Assessment & Plan Note (Signed)
[facility]                            CARDIOLOGY OFFICE NOTE   Mark Nguyen, Mark Nguyen                       MRN:          [id number]  DATE:09/12/2007                            DOB:          12/09/1931    PRIMARY CARE PHYSICIAN:  Barbette Hair. Artist Pais, DO   ELECTROPHYSIOLOGIST:  Doylene Canning. Ladona Ridgel, MD   CLINICAL HISTORY:  Mark Nguyen is 77 years old and returned for  management of his coronary heart disease and ischemic cardiomyopathy.  He had a remote diaphragmatic wall infarction and has an ejection  fraction of 21%.  He has a dual-chamber Guidant defibrillator which is  programmed at VVI backup of 40.  He has chronic left bundle branch  block.  We considered him for a BiV pacer in the past, but he did  extremely well on his walk test and his CPX test.   He says he has been doing quite well and has had no recent chest pain,  shortness breath, or palpitations.  He had been on WARCEF trial, but he  is now on Coumadin since he finished 6 years of followup.  He has  complained of easy bruising and he had a bruise in his right shoulder  from hunting.   PAST MEDICAL HISTORY:  1. Hypertension.  2. Hyperlipidemia.   CURRENT MEDICATIONS:  1. Carvedilol 20 mg b.i.d.  2. Spirolactone 12.5 mg daily.  3. Crestor 20 mg daily.  4. Warfarin.  5. Enalapril 10 mg b.i.d.  6. Furosemide 40 mg one-half tablet daily.   PHYSICAL EXAMINATION:  VITAL SIGNS:  Today, the blood pressure was  115/70.  The pulse was 53 and regular.  NECK:  There was no venous distension.  The carotid pulses were full  without bruits.  CHEST:  Clear.  HEART:  Rhythm was regular.  He had no murmurs or gallops.  ABDOMEN:  Soft.  No organomegaly.  Peripheral pulses were full.  There  is no peripheral edema.   The electrocardiogram showed left bundle branch block overriding his  defibrillator.  Interrogation of his defibrillator showed good  thresholds on both the atrial and ventricular lead.  He  had no episodes  of ventricular tachycardia.   IMPRESSION:  1. Coronary artery status post remote diaphragmatic wall infarction      treated with PCI in 1992.  2. Ischemic cardiomyopathy with ejection fraction of 20-25%.  3. Class I to II congestive heart failure.  4. Status post Guidant dual-chamber defibrillator programmed to VVI      backup.  5. Left bundle branch block.  6. Hypertension.  7. Hyperlipidemia.   RECOMMENDATIONS:  I think Mark Nguyen is doing well.  He is on Coumadin  as an indication of left ventricular dysfunction and we do not know the  results of the WARCEF trial yet.  Since he is having easy bruising and  some side effects, we will plan to switch him to a baby aspirin 81 mg  and stop his Coumadin.  We will get a BMP and CBC today.  I will see him  in 4 months.  He is being followed on the [employer] phone monitoring  system from [employer] every week, so he will be under close surveillance.  I will see him back in 4 months.     Mark Elvera Lennox Juanda Chance, MD, [facility]  Electronically Signed    BRB/MedQ  DD: 09/12/2007  DT: 09/13/2007  Job #: [id number]

## 2010-05-18 NOTE — Assessment & Plan Note (Signed)
[facility]                            CARDIOLOGY OFFICE NOTE   Mark Nguyen, Mark Nguyen                       MRN:          [id number]  DATE:05/17/2007                            DOB:          1931/04/13    PRIMARY CARE PHYSICIAN:  Barbette Hair. Artist Pais, D.O.   ELECTROPHYSIOLOGIST:  Doylene Canning. Ladona Ridgel, M.D.   CLINICAL HISTORY:  Mark Nguyen is 77 years old and returns for management  of his coronary heart disease and ischemic cardiomyopathy.  He had a  remote diaphragmatic wall infarction and has an ischemic cardiomyopathy  with an ejection fraction of 21%.  He also has a dual-chamber Guidant  defibrillator which is programmed to VVI backup up 40.  He also has  chronic left bundle branch block.  We did an echocardiogram on him last  November which showed an ejection fraction of 20-25%.   He says he is doing quite well.  Despite his severe left ventricular  dysfunction, he is able to be active and has only minimal shortness of  breath.  He works in the garden.  He has had no chest pain and no  palpitations.   PAST MEDICAL HISTORY:  1. Hypertension.  2. Hyperlipidemia.   CURRENT MEDICATIONS:  1. Carvedilol 25 mg b.i.d.  2. Enalapril 10 mg b.i.d.  3. Spironolactone 12.5 mg daily.  4. Crestor 20 mg daily.  5. __________ study drug.  6. Furosemide 40 mg 1/2 tablet daily.   PHYSICAL EXAMINATION:  VITAL SIGNS:  Blood pressure is 129/74 and pulse  59 regular.  NECK:  There was no venous distension.  The carotid pulses were full  without bruits.  CHEST:  Clear without rales or rhonchi.  CARDIAC:  The cardiac rhythm was regular.  I could hear no murmurs or  gallops.  ABDOMEN:  Soft without organomegaly.  Bowel sounds were normal.  EXTREMITIES:  Peripheral pulses were full.  There was no peripheral  edema.   We interrogated his pacemaker.  He had good thresholds on both channels.  He has had no tachy arrhythmias.  He is atrial pacing 0% of the time and  ventricular pacing 1% of the time.  He is approaching ERI.   IMPRESSION:  1. Coronary artery disease status post prior diaphragmatic wall      infarction treated with percutaneous coronary intervention in 1992.  2. Ischemic cardiomyopathy with an ejection fraction of 20-25%.  3. Class I to II congestive heart failure.  4. Status post Guidant dual-chamber implantable cardioverter-      defibrillator programmed to ventricular demand pacing, nearing      elective replacement indicator.  5. Left bundle branch block.  6. Hypertension.  7. Hyperlipidemia.   RECOMMENDATIONS:  Mr. Noy appears to be doing quite well.  We will  get a BMP and CBC on him today.  I talked to [facility], and he is getting  near ERI and will be following him on the phone as soon as he trips, and  we will have him come in and see Dr. Ladona Ridgel.  Otherwise, I will see him  back in  4 months.  Once he has a new defibrillator, I can see him every  4 months and then Dr. Ladona Ridgel can see him on the third or fourth month  visit which will be once a year for him.     Mark Elvera Lennox Juanda Chance, MD, [facility]  Electronically Signed    BRB/MedQ  DD: 05/17/2007  DT: 05/17/2007  Job #: [id number]

## 2010-05-21 ENCOUNTER — Other Ambulatory Visit: Payer: Self-pay | Admitting: *Deleted

## 2010-05-21 MED ORDER — ENALAPRIL MALEATE 10 MG PO TABS: 10.0000 mg | ORAL_TABLET | Freq: Every day | ORAL | Status: DC

## 2010-05-21 NOTE — Op Note (Signed)
[place]  Patient:    Mark Nguyen, Mark Nguyen Visit Number: [id number] MRN: [id number]          Service Type: MED Location: [phone] Attending Physician:  Rollene Rotunda Dictated by:   Rudene Christians. Ladona Ridgel, M.D. [facility] Proc. Date: 03/21/01 Admit Date:  03/19/2001   CC:         Bruce R. Juanda Chance, M.D. [facility]  Kathrine Cords, R.N.   Operative Report  PROCEDURE:  Implantation of a dual chamber ICD.  INDICATIONS:  VTVF arrest in the setting of high grade AV block and intermittent complete heart block.  INTRODUCTION:  The patient is a very pleasant 77 year old man with an ischemic cardiomyopathy, status post myocardial infarction in 1992 with angioplasty. He was in his usual state of health when he had palpitations, became dizzy, and subsequently passed out.  He was quickly seen by the paramedics and was found to be in ventricular fibrillation and cardioverted multiple times.  He was cardioverted to ventricular tachycardia and eventually cardioverted to sinus rhythm.  He was admitted to the hospital and underwent emergent heart catheterization which demonstrated an occluded right coronary artery with collateralization and ejection fraction of 25 to 30%.  He is now admitted for additional evaluation and treatment.  Of note, the patient has left bundle branch block with first degree AV block.  DESCRIPTION OF PROCEDURE:  After informed consent was obtained, the patient was taken to the diagnostic EP lab in the fasted state.  After the usual preparation and draping, intravenous fentanyl and midazolam were given for sedation.  30 cc of lidocaine was infiltrated into the left infraclavicular region.  A 10 cm incision was carried out over this region and electrocautery utilized to dissect down to the subpectoralis fascia.  10 cc of contrast was injected into the left upper extremity venous system demonstrating a patent left subclavian vein.  It was subsequently  punctured and the Guidant model [id number] defibrillation lead was advanced into the right ventricle.  It should be noted that during advancement into the right ventricle, the patient had complete heart block with a junctional escape rate at 40 beats per minute. Mapping was carried out in the right ventricle and R waves measured 16 millivolts and the pacing threshold was 0.6 volts at 0.5 milliseconds after the lead was actively fixed.  The pacing impedence was 500 ohms.  10-volt pacing did not result in diaphragmatic stimulation.  The patient was subsequently found to have continued intermittent heart block and for this reason, a separate Guidant model 4087 serial number [id number] active fixation lead was placed in the right atrium.  The lead was actively fixed.  At this point, P waves measured 4.8 millivolts and the pacing threshold was 1.7 volts at 0.5 milliseconds with a pacing impedence of 500 ohms.  10-volt pacing again did not result in diaphragmatic stimulation.  With the leads in satisfactory position, both were secured to the subpectoralis fascia with a figure-of-eight silk suture.  In addition silk suture was utilized to sew the sewing sleeves down.  Electrocautery was then used to make a subcutaneous pocket.  At this point, kanamycin was utilized to irrigate the wound and the Guidant Prizm 2DR [facility] model 1861 serial number [id number] dual chamber ICD was connected to the pacing and defibrillation lead and placed in the subcutaneous pocket.  A silk suture was utilized to secure the generator to the fascia.  At this point, the patient was more heavily sedated and defibrillation threshold testing  was carried out.  After the patient was deeply sedated, a T wave shock was utilized to induce ventricular fibrillation.  14 joules subsequently terminated the ventricular fibrillation and restored sinus rhythm.  Five minutes was allowed to ellapse and a second DFT test was carried out.  Again a  T wave shock was utilized to induce VF and a 14 joule shock restored sinus rhythm.  At this point additional kanamycin was utilized to irrigate the wound and the wound was closed with a layer of 2-0 Vicryl followed by a layer of 3-0 Vicryl followed by a layer of 4-0 Vicryl.  Benzoin was painted on the skin, Steri-Strips were applied, a pressure dressing placed, and the patient returned to his room in satisfactory condition.  COMPLICATIONS:  There were no immediate procedural complications.  RESULTS:  This demonstrates successful implantation of a Guidant dual chamber defibrillator in a patient with VTVF arrest, ischemic cardiomyopathy, and high grade AV block. Dictated by:   Rudene Christians. Ladona Ridgel, M.D. [facility] Attending Physician:  Rollene Rotunda DD:  03/21/01 TD:  03/22/01 Job: [id number]
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
## 2010-05-21 NOTE — Assessment & Plan Note (Signed)
[facility]                            CARDIOLOGY OFFICE NOTE   JESSICA, SEIDMAN                       MRN:          [id number]  DATE:12/19/2005                            DOB:          07/30/1931    PRIMARY CARE PHYSICIAN:  Dr. Thomos Lemons.   PRIMARY CARDIOLOGIST:  Dr. Lewayne Bunting.   PAST MEDICAL HISTORY:  Mr. Quirk is 77 years old and has had a previous  diaphragmatic wall infarction treated with PCI and has ischemic  cardiomyopathy, ejection fraction of 21%.  He has a Guidant dual chamber  defibrillator that is now programmed to back up VVI at a rate of 40.  This followed a V-Fib arrest in 2003.   He has done quite well recently, he has had no recent chest pain,  shortness of breath or palpitations.   His past medical history is significant for hypertension,  hyperlipidemia.   His current medications include Altace, carvedilol, furosemide,  Spironolactone, Crestor and WARCEF study drug.   On examination today the blood pressure was 124/75, pulse 57 and  regular.  There was no venous distention.  The carotid pulses were full without  bruits.  CHEST:  Was clear.  CARDIAC:  Rate and rhythm was negative. The heart sounds were normal. I  could hear no murmurs or gallops.  ABDOMEN:  Was soft with normal bowel sounds.  There was no  hepatosplenomegaly.  Peripheral pulses were full.  There is no peripheral edema.   An electrocardiogram today showed sinus bradycardia at a rate of 48 with  left bundle branch block.   We had interrogated his pacemaker by phone under the Guidant latitude  system and he had good threshold.  He is programmed to VVI with backup  rate of 40 and he had no episodes of tachycardia.  His threshold on both  the atrial and ventricular leads were good and the impedances were good.  He has about 40% left on his battery.   IMPRESSION:  1. Coronary artery disease status post prior diaphragmatic wall      infarction,  treated with percutaneous coronary intervention in      1992.  2. Ischemic cardiomyopathy with ejection fraction 21%.  3. Status post Guidant dual chamber implantable cardioverter-      defibrillator implanted 2003 for V-Fib arrest, now programmed to      backup VVI at 40 with good function.  4. Class I to II congestive heart failure.  5. Left bundle branch block.  6. Hypertension.  7. Hyperlipidemia.   RECOMMENDATIONS:  I think Mr. Meunier is doing quite well.  For cost  reasons, we will plan to switch him from Altace to enalapril.  We will  get a fasting lipid, BNP and CBC.  I will plan to see him back in 6  months.  Although he has an injection fraction of 21% and a left bundle  branch block, he is symptomatic with only class I to II congestive heart  failure, so he is not a candidate for CRT.  We had previously evaluated  him with a cardiopulmonary  exercise test.     Everardo Beals. Juanda Chance, MD, [facility]  Electronically Signed    BRB/MedQ  DD: 12/19/2005  DT: 12/19/2005  Job #: [id number]   cc:   Doylene Canning. Ladona Ridgel, MD
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
## 2010-06-17 ENCOUNTER — Encounter: Payer: Self-pay | Admitting: Internal Medicine

## 2010-06-21 ENCOUNTER — Encounter (INDEPENDENT_AMBULATORY_CARE_PROVIDER_SITE_OTHER): Payer: Medicare Other | Admitting: Internal Medicine

## 2010-06-21 ENCOUNTER — Other Ambulatory Visit: Payer: Self-pay

## 2010-06-21 DIAGNOSIS — I428 Other cardiomyopathies: Secondary | ICD-10-CM

## 2010-06-21 DIAGNOSIS — I469 Cardiac arrest, cause unspecified: Secondary | ICD-10-CM

## 2010-06-21 DIAGNOSIS — I2589 Other forms of chronic ischemic heart disease: Secondary | ICD-10-CM

## 2010-06-21 DIAGNOSIS — I5022 Chronic systolic (congestive) heart failure: Secondary | ICD-10-CM

## 2010-06-21 DIAGNOSIS — Z95810 Presence of automatic (implantable) cardiac defibrillator: Secondary | ICD-10-CM

## 2010-06-22 NOTE — Letter (Deleted)
June 21, 2010    RE:  Mark, Nguyen MRN:  [id number]  /  DOB:  08/22/1931  [name] is seen in follow-up for CRTD implanted.  He has aborted cardiac arrest in 2003 following a myocardial infarction in 1992.  He underwent ICD implantation at that time and in February of 2011, underwent defibrillator generator replacement with upgrade to an LV lead because of chronic systolic heart failure.  Since that time, he has done much better with more like class II symptoms.  He denies shortness of breath with anything but moderate exertion.  He has no chest pain.  He has had no peripheral edema, nocturnal dyspnea or palpitations.  He does not have a primary care physician.  CURRENT MEDICATIONS:  Include rosuvastatin, enalapril, spironolactone and carvedilol.  EXAMINATION:  VITAL SIGNS:  His blood pressure was 126/74.  His pulse was 59. NECK:  Veins were flat.  His carotids were brisk. LUNGS:  Clear. BACK:  Without kyphosis. HEART:  Sounds were regular without murmurs or gallops. ABDOMEN:  Soft. EXTREMITIES:  Have no edema. SKIN:  Warm and dry. NEURO:  He is alert and oriented.  Interrogation of his St. Jude ICD demonstrates a P-wave impedance of 390, a threshold of 1.25 at 0.6, the R-wave was 12 with a PC impedance of 540, threshold 0.7 at 5.5, and the LV impedance was 430 with a pacing threshold of 0.75 at 0.5 and the LV ring to RVC configuration high- voltage impedance of 46 ohms.  There was no intercurrent episodes.  He had occasional atrial arrhythmias with a relatively slow atrial cycle length of about 400 milliseconds.  IMPRESSION: 1. Aborted cardiac arrest. 2. Ischemic heart disease with:     a.     Prior myocardial infarction.     b.     Ejection fraction of 25%. 3. Chronic systolic heart failure. 4. Left bundle-branch block. 5. Status post ICD implantation for #2 with CRT upgrade for #4. 6. No primary care physician.  Mark Nguyen is doing really very well.  We will  plan to check his electrolytes at his next office visit in December.  It would be appropriate at that time also to check his lipid status.   Sincerely,     Duke Salvia, MD, [facility]   SCK/MedQ  DD: 06/21/2010  DT: 06/22/2010  Job #: [id number]

## 2010-06-22 NOTE — Assessment & Plan Note (Signed)
[facility] CARDIOLOGY OFFICE NOTE  Mark Nguyen, Mark Nguyen                       MRN:          [id number] DATE:06/21/2010                            DOB:          02-13-1931   Mark Nguyen is seen in follow-up for CRTD implanted.  He has aborted cardiac arrest in 2003 following a myocardial infarction in 1992.  He underwent ICD implantation at that time and in February of 2011, underwent defibrillator generator replacement with upgrade to an LV lead because of chronic systolic heart failure.  Since that time, he has done much better with more like class II symptoms.  He denies shortness of breath with anything but moderate exertion.  He has no chest pain.  He has had no peripheral edema, nocturnal dyspnea or palpitations.  He does not have a primary care physician.  CURRENT MEDICATIONS:  Include rosuvastatin, enalapril, spironolactone and carvedilol.  EXAMINATION:  VITAL SIGNS:  His blood pressure was 126/74.  His pulse was 59. NECK:  Veins were flat.  His carotids were brisk. LUNGS:  Clear. BACK:  Without kyphosis. HEART:  Sounds were regular without murmurs or gallops. ABDOMEN:  Soft. EXTREMITIES:  Have no edema. SKIN:  Warm and dry. NEURO:  He is alert and oriented.  Interrogation of his St. Jude ICD demonstrates a P-wave impedance of 390, a threshold of 1.25 at 0.6, the R-wave was 12 with a PC impedance of 540, threshold 0.7 at 5.5, and the LV impedance was 430 with a pacing threshold of 0.75 at 0.5 and the LV ring to RVC configuration high- voltage impedance of 46 ohms.  There was no intercurrent episodes.  He had occasional atrial arrhythmias with a relatively slow atrial cycle length of about 400 milliseconds.  IMPRESSION: 1. Aborted cardiac arrest. 2. Ischemic heart disease with:     a.     Prior myocardial infarction.     b.     Ejection fraction of 25%. 3. Chronic systolic heart failure. 4. Left  bundle-branch block. 5. Status post ICD implantation for #2 with CRT upgrade for #4. 6. No primary care physician.  Mark Nguyen is doing really very well.  We will plan to check his electrolytes at his next office visit in December.  It would be appropriate at that time also to check his lipid status.    Duke Salvia, MD, [facility]    SCK/MedQ  DD: 06/21/2010  DT: 06/22/2010  Job #: [id number]

## 2010-07-22 ENCOUNTER — Encounter: Payer: Self-pay | Admitting: Cardiovascular Disease

## 2010-07-26 ENCOUNTER — Telehealth: Payer: Self-pay | Admitting: Internal Medicine

## 2010-07-26 NOTE — Telephone Encounter (Signed)
LOV x 2 faxed to [facility] @ [phone]  07/26/10/km

## 2010-09-21 ENCOUNTER — Ambulatory Visit (INDEPENDENT_AMBULATORY_CARE_PROVIDER_SITE_OTHER): Payer: Medicare Other | Admitting: *Deleted

## 2010-09-21 ENCOUNTER — Encounter: Payer: Self-pay | Admitting: Internal Medicine

## 2010-09-21 DIAGNOSIS — I2589 Other forms of chronic ischemic heart disease: Secondary | ICD-10-CM

## 2010-09-21 DIAGNOSIS — I472 Ventricular tachycardia: Secondary | ICD-10-CM

## 2010-09-21 LAB — ICD DEVICE OBSERVATION
AL AMPLITUDE: 4 mv
BATTERY VOLTAGE: 2.9779 V
DEVICE MODEL ICD: 751523
FVT: 0
LV LEAD IMPEDENCE ICD: 462.5 Ohm
MODE SWITCH EPISODES: 6
PACEART VT: 0
RV LEAD AMPLITUDE: 12 mv
RV LEAD THRESHOLD: 0.75 V
TOT-0007: 3
TOT-0010: 6
TZAT-0012SLOWVT: 200 ms
TZAT-0013FASTVT: 2
TZAT-0013SLOWVT: 2
TZAT-0018FASTVT: NEGATIVE
TZAT-0018SLOWVT: NEGATIVE
TZAT-0019SLOWVT: 7.5 V
TZAT-0020FASTVT: 1 ms
TZAT-0020SLOWVT: 1 ms
TZON-0003SLOWVT: 375 ms
TZON-0004SLOWVT: 30
TZON-0005FASTVT: 6
TZON-0005SLOWVT: 6
TZON-0010FASTVT: 80 ms
TZST-0001FASTVT: 2
TZST-0001SLOWVT: 4
TZST-0003FASTVT: 30 J
TZST-0003FASTVT: 40 J
TZST-0003SLOWVT: 40 J
VENTRICULAR PACING ICD: 98 pct
VF: 0

## 2010-09-21 NOTE — Progress Notes (Signed)
ICD check 

## 2010-11-22 ENCOUNTER — Other Ambulatory Visit: Payer: Self-pay | Admitting: Internal Medicine

## 2010-11-24 ENCOUNTER — Other Ambulatory Visit: Payer: Self-pay | Admitting: Internal Medicine

## 2010-11-24 MED ORDER — CARVEDILOL 25 MG PO TABS: 25.0000 mg | ORAL_TABLET | Freq: Two times a day (BID) | ORAL | Status: DC

## 2010-12-15 ENCOUNTER — Other Ambulatory Visit: Payer: Self-pay

## 2010-12-15 MED ORDER — ROSUVASTATIN CALCIUM 20 MG PO TABS: 20.0000 mg | ORAL_TABLET | Freq: Every day | ORAL | Status: DC

## 2010-12-16 ENCOUNTER — Other Ambulatory Visit: Payer: Self-pay

## 2010-12-20 ENCOUNTER — Other Ambulatory Visit: Payer: Self-pay

## 2010-12-20 MED ORDER — ROSUVASTATIN CALCIUM 20 MG PO TABS: 20.0000 mg | ORAL_TABLET | Freq: Every day | ORAL | Status: DC

## 2011-01-05 ENCOUNTER — Encounter: Payer: Self-pay | Admitting: Internal Medicine

## 2011-01-05 ENCOUNTER — Other Ambulatory Visit: Payer: Self-pay

## 2011-01-05 MED ORDER — SPIRONOLACTONE 25 MG PO TABS: 25.0000 mg | ORAL_TABLET | Freq: Every day | ORAL | Status: DC

## 2011-01-11 ENCOUNTER — Encounter: Payer: Self-pay | Admitting: Internal Medicine

## 2011-01-18 ENCOUNTER — Encounter: Payer: Self-pay | Admitting: Internal Medicine

## 2011-01-18 ENCOUNTER — Ambulatory Visit (INDEPENDENT_AMBULATORY_CARE_PROVIDER_SITE_OTHER): Payer: Medicare Other | Admitting: Internal Medicine

## 2011-01-18 DIAGNOSIS — Z95810 Presence of automatic (implantable) cardiac defibrillator: Secondary | ICD-10-CM

## 2011-01-18 DIAGNOSIS — I472 Ventricular tachycardia: Secondary | ICD-10-CM

## 2011-01-18 DIAGNOSIS — E7800 Pure hypercholesterolemia, unspecified: Secondary | ICD-10-CM

## 2011-01-18 DIAGNOSIS — I2589 Other forms of chronic ischemic heart disease: Secondary | ICD-10-CM

## 2011-01-18 DIAGNOSIS — I4891 Unspecified atrial fibrillation: Secondary | ICD-10-CM

## 2011-01-18 DIAGNOSIS — I5022 Chronic systolic (congestive) heart failure: Secondary | ICD-10-CM

## 2011-01-18 LAB — ICD DEVICE OBSERVATION
AL IMPEDENCE ICD: 400 Ohm
AL THRESHOLD: 1 V
ATRIAL PACING ICD: 84 pct
BAMS-0001: 150 {beats}/min
BAMS-0003: 70 {beats}/min
CHARGE TIME: 9.6 s
MODE SWITCH EPISODES: 13
PACEART VT: 0
RV LEAD THRESHOLD: 0.75 V
TOT-0006: 20110211000000
TOT-0010: 7
TZAT-0001SLOWVT: 1
TZAT-0004FASTVT: 8
TZAT-0013FASTVT: 2
TZAT-0020SLOWVT: 1 ms
TZON-0005SLOWVT: 6
TZST-0001FASTVT: 2
TZST-0001FASTVT: 5
TZST-0001SLOWVT: 2
TZST-0001SLOWVT: 4
TZST-0003FASTVT: 30 J
TZST-0003FASTVT: 40 J
TZST-0003SLOWVT: 36 J
VENTRICULAR PACING ICD: 98 pct

## 2011-01-18 LAB — HEPATIC FUNCTION PANEL
ALT: 25 U/L (ref 0–53)
Alkaline Phosphatase: 59 U/L (ref 39–117)
Bilirubin, Direct: 0.1 mg/dL (ref 0.0–0.3)
Total Protein: 7.3 g/dL (ref 6.0–8.3)

## 2011-01-18 LAB — LIPID PANEL
Cholesterol: 123 mg/dL (ref 0–200)
LDL Cholesterol: 52 mg/dL (ref 0–99)

## 2011-01-18 MED ORDER — ATORVASTATIN CALCIUM 20 MG PO TABS: 20.0000 mg | ORAL_TABLET | Freq: Every day | ORAL | Status: DC

## 2011-01-18 MED ORDER — INDOMETHACIN ER 75 MG PO CPCR: 75.0000 mg | ORAL_CAPSULE | ORAL | Status: DC | PRN

## 2011-01-18 MED ORDER — CARVEDILOL 25 MG PO TABS: 25.0000 mg | ORAL_TABLET | Freq: Two times a day (BID) | ORAL | Status: DC

## 2011-01-18 NOTE — Progress Notes (Signed)
HPI Mr. Pienta returns today for followup. He is a pleasant 78 year old man with a cardiac arrest secondary to ventricular fibrillation status post resuscitation. He has a history of chronic systolic heart failure and is status post ICD implantation. He denies chest pain or shortness of breath. He has class II heart failure symptoms. The patient denies palpitations and has had no syncope. No peripheral edema. No Known Allergies   Current Outpatient Prescriptions  Medication Sig Dispense Refill  . aspirin 81 MG tablet Take 81 mg by mouth daily.        . carvedilol (COREG) 25 MG tablet Take 1 tablet (25 mg total) by mouth 2 (two) times daily with a meal.  60 tablet  11  . enalapril (VASOTEC) 10 MG tablet ONE TABLET BY MOUTH DAILY  30 tablet  6  . indomethacin (INDOCIN SR) 75 MG CR capsule Take 1 capsule (75 mg total) by mouth as needed.  30 capsule  1  . spironolactone (ALDACTONE) 25 MG tablet Take 1 tablet (25 mg total) by mouth daily.  30 tablet  2  . DISCONTD: carvedilol (COREG) 25 MG tablet Take 1 tablet (25 mg total) by mouth 2 (two) times daily with a meal.  60 tablet  2  . atorvastatin (LIPITOR) 20 MG tablet Take 1 tablet (20 mg total) by mouth daily.  30 tablet  11     Past Medical History  Diagnosis Date  . Hx-sudden cardiac arrest   . LBBB (left bundle branch block)   . CAD (coronary artery disease)   . Ventricular tachycardia   . History of ventricular fibrillation   . Ischemic cardiomyopathy   . HTN (hypertension)   . Dyslipidemia   . CHF (congestive heart failure)   . HLD (hyperlipidemia)     ROS:   All systems reviewed and negative except as noted in the HPI.   Past Surgical History  Procedure Date  . Cardiac defibrillator placement     St Jude  . Doppler echocardiography 2008, 2011  . Cardiac catheterization 10/12/2008  . Tonsilectomy, adenoidectomy, bilateral myringotomy and tubes      Family History  Problem Relation Age of Onset  . Coronary artery  disease Other      History   Social History  . Marital Status: Divorced    Spouse Name: N/A    Number of Children: N/A  . Years of Education: N/A   Occupational History  . furniture truck driver    Social History Main Topics  . Smoking status: Former Smoker    Quit date: 01/03/1985  . Smokeless tobacco: Not on file  . Alcohol Use: Not on file  . Drug Use: Not on file  . Sexually Active: Not on file   Other Topics Concern  . Not on file   Social History Narrative  . No narrative on file     BP 132/76  Pulse 65  Ht 5' 8.5" (1.74 m)  Wt 88.814 kg (195 lb 12.8 oz)  BMI 29.34 kg/m2  SpO2 95%  Physical Exam:  Well appearing NAD HEENT: Unremarkable Neck:  No JVD, no thyromegally Lymphatics:  No adenopathy Back:  No CVA tenderness Lungs:  Clear with no wheezes, rales, or rhonchi. Well-healed ICD incision. HEART:  Regular rate rhythm, no murmurs, no rubs, no clicks Abd:  soft, positive bowel sounds, no organomegally, no rebound, no guarding Ext:  2 plus pulses, no edema, no cyanosis, no clubbing Skin:  No rashes no nodules Neuro:  CN II  through XII intact, motor grossly intact  DEVICE  Normal device function.  See PaceArt for details.   Assess/Plan:

## 2011-01-18 NOTE — Assessment & Plan Note (Signed)
>>  ASSESSMENT AND PLAN FOR AUTOMATIC IMPLANTABLE CARDIOVERTER-DEFIBRILLATOR IN SITU WRITTEN ON 01/18/2011  6:09 PM BY WADDELL DANELLE ORN, MD  His biventricular ICD is working normally. We'll plan to recheck in several months.

## 2011-01-18 NOTE — Patient Instructions (Signed)
Your physician wants you to follow-up in: 12 months with [name] will receive a reminder letter in the mail two months in advance. If you don't receive a letter, please call our office to schedule the follow-up appointment.  Your physician recommends that you schedule a follow-up appointment in: 3 months in the device clinic  Your physician recommends that you return for lab work today  Lipid/liver  Your physician has recommended you make the following change in your medication:  1) Stop Crestor after you finish current bottle 2) Start Lipitor 20mg  daily

## 2011-01-18 NOTE — Assessment & Plan Note (Signed)
The patient denies anginal symptoms. He'll continue his current medical therapy.

## 2011-01-18 NOTE — Assessment & Plan Note (Signed)
This appears to be a new problem. He is asymptomatic. Interrogation of his ICD demonstrates that he has up to one hour at a time of atrial fibrillation. I discussed the treatment options with the patient. I recommended that we initiate anti-coagulation. We plan to start Pradaxa as the patient does not want to return for coumadin checks.

## 2011-01-18 NOTE — Assessment & Plan Note (Signed)
His symptoms are well-controlled. He will continue his current medical therapy. 

## 2011-01-18 NOTE — Assessment & Plan Note (Signed)
>>  ASSESSMENT AND PLAN FOR SYSTOLIC HEART FAILURE, CHRONIC WRITTEN ON 01/18/2011  6:10 PM BY WADDELL DANELLE ORN, MD  His symptoms are well controlled. He will continue his current medical therapy

## 2011-01-18 NOTE — Assessment & Plan Note (Signed)
His biventricular ICD is working normally. We'll plan to recheck in several months. 

## 2011-04-20 ENCOUNTER — Encounter: Payer: Self-pay | Admitting: Internal Medicine

## 2011-04-20 ENCOUNTER — Ambulatory Visit (INDEPENDENT_AMBULATORY_CARE_PROVIDER_SITE_OTHER): Payer: Medicare Other | Admitting: *Deleted

## 2011-04-20 DIAGNOSIS — I2589 Other forms of chronic ischemic heart disease: Secondary | ICD-10-CM

## 2011-04-20 DIAGNOSIS — I5022 Chronic systolic (congestive) heart failure: Secondary | ICD-10-CM

## 2011-04-20 LAB — ICD DEVICE OBSERVATION
AL AMPLITUDE: 3.6 mv
AL THRESHOLD: 1 V
DEVICE MODEL ICD: 751523
FVT: 0
HV IMPEDENCE: 48 Ohm
MODE SWITCH EPISODES: 11
PACEART VT: 0
RV LEAD AMPLITUDE: 12 mv
RV LEAD THRESHOLD: 0.75 V
TOT-0007: 3
TOT-0010: 8
TZAT-0012SLOWVT: 200 ms
TZAT-0013FASTVT: 2
TZAT-0013SLOWVT: 2
TZAT-0018FASTVT: NEGATIVE
TZAT-0018SLOWVT: NEGATIVE
TZAT-0019SLOWVT: 7.5 V
TZAT-0020FASTVT: 1 ms
TZAT-0020SLOWVT: 1 ms
TZON-0004SLOWVT: 30
TZON-0005FASTVT: 6
TZON-0005SLOWVT: 6
TZON-0010FASTVT: 80 ms
TZST-0001FASTVT: 2
TZST-0001SLOWVT: 4
TZST-0003FASTVT: 30 J
TZST-0003FASTVT: 40 J
TZST-0003SLOWVT: 40 J
VENTRICULAR PACING ICD: 99 pct
VF: 0

## 2011-04-20 NOTE — Progress Notes (Signed)
ICD check with [employer] 

## 2011-06-20 ENCOUNTER — Other Ambulatory Visit: Payer: Self-pay | Admitting: Internal Medicine

## 2011-07-08 ENCOUNTER — Other Ambulatory Visit: Payer: Self-pay | Admitting: *Deleted

## 2011-07-08 MED ORDER — ENALAPRIL MALEATE 10 MG PO TABS: 10.0000 mg | ORAL_TABLET | Freq: Every day | ORAL | Status: DC

## 2011-07-08 NOTE — Telephone Encounter (Signed)
Refilled enalapril

## 2011-07-20 ENCOUNTER — Ambulatory Visit (INDEPENDENT_AMBULATORY_CARE_PROVIDER_SITE_OTHER): Payer: Medicare Other | Admitting: *Deleted

## 2011-07-20 ENCOUNTER — Encounter: Payer: Self-pay | Admitting: Internal Medicine

## 2011-07-20 DIAGNOSIS — I5022 Chronic systolic (congestive) heart failure: Secondary | ICD-10-CM

## 2011-07-20 DIAGNOSIS — I2589 Other forms of chronic ischemic heart disease: Secondary | ICD-10-CM

## 2011-07-20 LAB — ICD DEVICE OBSERVATION
AL AMPLITUDE: 3.9 mv
AL IMPEDENCE ICD: 400 Ohm
LV LEAD THRESHOLD: 0.625 V
MODE SWITCH EPISODES: 6
RV LEAD THRESHOLD: 0.75 V
TOT-0007: 3
TZAT-0001FASTVT: 1
TZAT-0001SLOWVT: 1
TZAT-0013SLOWVT: 2
TZAT-0019SLOWVT: 7.5 V
TZAT-0020SLOWVT: 1 ms
TZON-0004SLOWVT: 30
TZON-0005SLOWVT: 6
TZON-0010FASTVT: 80 ms
TZST-0001FASTVT: 2
TZST-0001FASTVT: 5
TZST-0001SLOWVT: 2
TZST-0001SLOWVT: 4
TZST-0003FASTVT: 20 J
TZST-0003FASTVT: 40 J
TZST-0003SLOWVT: 36 J
TZST-0003SLOWVT: 40 J
VENTRICULAR PACING ICD: 98 pct
VF: 0

## 2011-07-20 NOTE — Progress Notes (Signed)
ICD check with [employer] 

## 2011-10-11 ENCOUNTER — Encounter: Payer: Self-pay | Admitting: *Deleted

## 2011-10-20 ENCOUNTER — Encounter: Payer: Self-pay | Admitting: Internal Medicine

## 2011-10-20 ENCOUNTER — Ambulatory Visit (INDEPENDENT_AMBULATORY_CARE_PROVIDER_SITE_OTHER): Payer: Medicare Other | Admitting: *Deleted

## 2011-10-20 DIAGNOSIS — I5022 Chronic systolic (congestive) heart failure: Secondary | ICD-10-CM

## 2011-10-20 DIAGNOSIS — I2589 Other forms of chronic ischemic heart disease: Secondary | ICD-10-CM

## 2011-10-20 LAB — ICD DEVICE OBSERVATION
AL AMPLITUDE: 2.6 mv
DEVICE MODEL ICD: 751523
FVT: 0
HV IMPEDENCE: 50 Ohm
LV LEAD IMPEDENCE ICD: 450 Ohm
LV LEAD THRESHOLD: 0.625 V
RV LEAD AMPLITUDE: 12 mv
TOT-0007: 3
TOT-0008: 0
TOT-0009: 0
TOT-0010: 10
TZAT-0012SLOWVT: 200 ms
TZAT-0013FASTVT: 2
TZAT-0013SLOWVT: 2
TZAT-0018FASTVT: NEGATIVE
TZAT-0018SLOWVT: NEGATIVE
TZAT-0019FASTVT: 7.5 V
TZAT-0020FASTVT: 1 ms
TZAT-0020SLOWVT: 1 ms
TZON-0003FASTVT: 300 ms
TZON-0003SLOWVT: 375 ms
TZON-0004FASTVT: 16
TZON-0005FASTVT: 6
TZON-0005SLOWVT: 6
TZON-0010FASTVT: 80 ms
TZST-0001FASTVT: 2
TZST-0001FASTVT: 4
TZST-0001SLOWVT: 2
TZST-0003FASTVT: 30 J
TZST-0003FASTVT: 40 J
TZST-0003SLOWVT: 40 J
VENTRICULAR PACING ICD: 96 pct
VF: 0

## 2011-10-20 NOTE — Progress Notes (Signed)
ICD check with [employer] 

## 2011-10-20 NOTE — Patient Instructions (Addendum)
Office visit with Dr. Ladona Ridgel 01/10/12 @ 9:30am.

## 2012-01-10 ENCOUNTER — Encounter: Payer: Self-pay | Admitting: Internal Medicine

## 2012-01-10 ENCOUNTER — Ambulatory Visit (INDEPENDENT_AMBULATORY_CARE_PROVIDER_SITE_OTHER): Payer: Medicare Other | Admitting: Internal Medicine

## 2012-01-10 VITALS — BP 121/71 | HR 72 | Ht 67.5 in | Wt 199.1 lb

## 2012-01-10 DIAGNOSIS — I2589 Other forms of chronic ischemic heart disease: Secondary | ICD-10-CM

## 2012-01-10 DIAGNOSIS — I4891 Unspecified atrial fibrillation: Secondary | ICD-10-CM

## 2012-01-10 DIAGNOSIS — I472 Ventricular tachycardia: Secondary | ICD-10-CM

## 2012-01-10 DIAGNOSIS — I5022 Chronic systolic (congestive) heart failure: Secondary | ICD-10-CM

## 2012-01-10 DIAGNOSIS — Z95810 Presence of automatic (implantable) cardiac defibrillator: Secondary | ICD-10-CM

## 2012-01-10 LAB — ICD DEVICE OBSERVATION
AL AMPLITUDE: 1.6 mv
AL THRESHOLD: 1 V
DEVICE MODEL ICD: 751523
FVT: 0
HV IMPEDENCE: 49 Ohm
LV LEAD THRESHOLD: 0.625 V
MODE SWITCH EPISODES: 1393
PACEART VT: 0
RV LEAD AMPLITUDE: 12 mv
TOT-0007: 3
TOT-0010: 10
TZAT-0001FASTVT: 1
TZAT-0013SLOWVT: 2
TZAT-0018FASTVT: NEGATIVE
TZAT-0018SLOWVT: NEGATIVE
TZAT-0020SLOWVT: 1 ms
TZON-0003FASTVT: 300 ms
TZON-0003SLOWVT: 375 ms
TZON-0005SLOWVT: 6
TZON-0010FASTVT: 80 ms
TZST-0001FASTVT: 2
TZST-0001FASTVT: 4
TZST-0001SLOWVT: 2
TZST-0001SLOWVT: 4
TZST-0003FASTVT: 20 J
TZST-0003FASTVT: 40 J
TZST-0003SLOWVT: 40 J
VENTRICULAR PACING ICD: 97 pct
VF: 0

## 2012-01-10 MED ORDER — RIVAROXABAN 20 MG PO TABS: 20.0000 mg | ORAL_TABLET | Freq: Every day | ORAL | Status: DC

## 2012-01-10 MED ORDER — CARVEDILOL 25 MG PO TABS: 25.0000 mg | ORAL_TABLET | Freq: Two times a day (BID) | ORAL | Status: DC

## 2012-01-10 MED ORDER — ENALAPRIL MALEATE 10 MG PO TABS: 5.0000 mg | ORAL_TABLET | Freq: Two times a day (BID) | ORAL | Status: DC

## 2012-01-10 MED ORDER — ATORVASTATIN CALCIUM 20 MG PO TABS: 20.0000 mg | ORAL_TABLET | Freq: Every day | ORAL | Status: DC

## 2012-01-10 MED ORDER — SPIRONOLACTONE 25 MG PO TABS: 12.5000 mg | ORAL_TABLET | Freq: Every day | ORAL | Status: DC

## 2012-01-10 NOTE — Patient Instructions (Signed)
Your physician recommends that you schedule a follow-up appointment in: 3 months with the Device Clinic  Your physician wants you to follow-up in: 12 months with [name] will receive a reminder letter in the mail two months in advance. If you don't receive a letter, please call our office to schedule the follow-up appointment.  Your physician has recommended you make the following change in your medication: START Xarelto 20 mg daily

## 2012-01-10 NOTE — Assessment & Plan Note (Signed)
>>  ASSESSMENT AND PLAN FOR SYSTOLIC HEART FAILURE, CHRONIC WRITTEN ON 01/10/2012  9:49 AM BY WADDELL DANELLE ORN, MD  His chronic systolic heart failure is class II. He will continue his current medical therapy, and maintain a low-sodium diet.

## 2012-01-10 NOTE — Assessment & Plan Note (Signed)
He denies anginal symptoms. He will continue his current medical therapy. 

## 2012-01-10 NOTE — Assessment & Plan Note (Signed)
He appears to have had an uptake in his episodes of atrial fibrillation. Several have lasted for 3 days. At this point I recommended that he undergo initiation of anticoagulation. Several years ago, the patient had been on warfarin. His INR was not well-controlled and this drug was ultimately discontinued. At this point I recommended that the patient start Xarelto. I plan to see him back in a year.

## 2012-01-10 NOTE — Progress Notes (Signed)
HPI Mark Nguyen returns today for followup. He is a very pleasant 79 year old man with ischemic cardiomyopathy, ventricular tachycardia, chronic systolic heart failure, status post ICD implantation. In the interim, he denies chest pain, shortness of breath, syncope, or ICD shock. He remains active. He denies palpitations. No Known Allergies   Current Outpatient Prescriptions  Medication Sig Dispense Refill  . aspirin 81 MG tablet Take 81 mg by mouth daily.        [name] atorvastatin (LIPITOR) 20 MG tablet Take 1 tablet (20 mg total) by mouth daily.  30 tablet  11  . carvedilol (COREG) 25 MG tablet Take 1 tablet (25 mg total) by mouth 2 (two) times daily with a meal.  60 tablet  11  . enalapril (VASOTEC) 10 MG tablet Take 5 mg by mouth 2 (two) times daily.      . indomethacin (INDOCIN SR) 75 MG CR capsule Take 1 capsule (75 mg total) by mouth as needed.  30 capsule  1  . omeprazole (PRILOSEC) 20 MG capsule Take 20 mg by mouth daily.      [name] spironolactone (ALDACTONE) 25 MG tablet Take 25 mg by mouth daily. Take 1/2 tablet daily      . Tamsulosin HCl (FLOMAX) 0.4 MG CAPS Take 0.4 mg by mouth daily.         Past Medical History  Diagnosis Date  . Hx-sudden cardiac arrest   . LBBB (left bundle branch block)   . CAD (coronary artery disease)   . Ventricular tachycardia   . History of ventricular fibrillation   . Ischemic cardiomyopathy   . HTN (hypertension)   . Dyslipidemia   . CHF (congestive heart failure)   . HLD (hyperlipidemia)     ROS:   All systems reviewed and negative except as noted in the HPI.   Past Surgical History  Procedure Date  . Cardiac defibrillator placement     St Jude  . Doppler echocardiography 2008, 2011  . Cardiac catheterization 10/12/2008  . Tonsilectomy, adenoidectomy, bilateral myringotomy and tubes      Family History  Problem Relation Age of Onset  . Coronary artery disease Other      History   Social History  . Marital Status: Divorced   Spouse Name: N/A    Number of Children: N/A  . Years of Education: N/A   Occupational History  . furniture truck driver    Social History Main Topics  . Smoking status: Former Smoker    Quit date: 01/03/1985  . Smokeless tobacco: Not on file  . Alcohol Use: Not on file  . Drug Use: Not on file  . Sexually Active: Not on file   Other Topics Concern  . Not on file   Social History Narrative  . No narrative on file     BP 121/71  Pulse 72  Ht 5' 7.5" (1.715 m)  Wt 199 lb 1.9 oz (90.32 kg)  BMI 30.73 kg/m2  Physical Exam:  Well appearing 79 year old man, NAD HEENT: Unremarkable Neck:  No JVD, no thyromegally Lungs:  Clear with no wheezes, rales, or rhonchi. HEART:  Regular rate rhythm, no murmurs, no rubs, no clicks Abd:  soft, positive bowel sounds, no organomegally, no rebound, no guarding Ext:  2 plus pulses, no edema, no cyanosis, no clubbing Skin:  No rashes no nodules Neuro:  CN II through XII intact, motor grossly intact  EKG normal sinus rhythm with biventricular pacing  DEVICE  Normal device function.  See PaceArt for  details. One percent atrial fibrillation  Assess/Plan:

## 2012-01-10 NOTE — Assessment & Plan Note (Signed)
His chronic systolic heart failure is class II. He will continue his current medical therapy, and maintain a low-sodium diet.

## 2012-04-11 ENCOUNTER — Other Ambulatory Visit: Payer: Self-pay | Admitting: Internal Medicine

## 2012-04-11 ENCOUNTER — Ambulatory Visit (INDEPENDENT_AMBULATORY_CARE_PROVIDER_SITE_OTHER): Payer: Medicare Other | Admitting: *Deleted

## 2012-04-11 DIAGNOSIS — I5022 Chronic systolic (congestive) heart failure: Secondary | ICD-10-CM

## 2012-04-11 DIAGNOSIS — I2589 Other forms of chronic ischemic heart disease: Secondary | ICD-10-CM

## 2012-04-11 LAB — ICD DEVICE OBSERVATION
AL THRESHOLD: 1 V
ATRIAL PACING ICD: 81 pct
BAMS-0001: 150 {beats}/min
BAMS-0003: 70 {beats}/min
DEVICE MODEL ICD: 751523
FVT: 0
LV LEAD IMPEDENCE ICD: 575 Ohm
LV LEAD THRESHOLD: 0.625 V
RV LEAD AMPLITUDE: 12 mv
RV LEAD IMPEDENCE ICD: 625 Ohm
TOT-0006: 20110211000000
TOT-0008: 0
TZAT-0001FASTVT: 1
TZAT-0001SLOWVT: 1
TZAT-0004FASTVT: 8
TZAT-0012FASTVT: 200 ms
TZAT-0018SLOWVT: NEGATIVE
TZON-0003SLOWVT: 375 ms
TZON-0010FASTVT: 80 ms
TZON-0010SLOWVT: 80 ms
TZST-0001FASTVT: 3
TZST-0001FASTVT: 5
TZST-0001SLOWVT: 2
TZST-0003FASTVT: 20 J
TZST-0003FASTVT: 40 J
VENTRICULAR PACING ICD: 98 pct

## 2012-04-11 NOTE — Progress Notes (Signed)
ICD check with [employer] 

## 2012-05-04 ENCOUNTER — Encounter: Payer: Self-pay | Admitting: Internal Medicine

## 2012-07-11 ENCOUNTER — Encounter: Payer: Self-pay | Admitting: Internal Medicine

## 2012-07-11 ENCOUNTER — Ambulatory Visit (INDEPENDENT_AMBULATORY_CARE_PROVIDER_SITE_OTHER): Payer: Medicare Other | Admitting: *Deleted

## 2012-07-11 DIAGNOSIS — I2589 Other forms of chronic ischemic heart disease: Secondary | ICD-10-CM

## 2012-07-11 DIAGNOSIS — I472 Ventricular tachycardia: Secondary | ICD-10-CM

## 2012-07-11 DIAGNOSIS — I5022 Chronic systolic (congestive) heart failure: Secondary | ICD-10-CM

## 2012-07-11 LAB — ICD DEVICE OBSERVATION
ATRIAL PACING ICD: 80 pct
BAMS-0001: 150 {beats}/min
BAMS-0003: 70 {beats}/min
DEVICE MODEL ICD: 751523
FVT: 0
LV LEAD IMPEDENCE ICD: 437.5 Ohm
RV LEAD IMPEDENCE ICD: 550 Ohm
TOT-0006: 20110211000000
TOT-0007: 3
TOT-0008: 0
TOT-0009: 0
TZAT-0004FASTVT: 8
TZAT-0004SLOWVT: 8
TZAT-0012FASTVT: 200 ms
TZAT-0018FASTVT: NEGATIVE
TZAT-0018SLOWVT: NEGATIVE
TZAT-0019FASTVT: 7.5 V
TZON-0003FASTVT: 300 ms
TZON-0003SLOWVT: 375 ms
TZON-0004FASTVT: 30
TZON-0010SLOWVT: 80 ms
TZST-0001FASTVT: 3
TZST-0001FASTVT: 4
TZST-0001SLOWVT: 3
TZST-0003FASTVT: 20 J
TZST-0003FASTVT: 40 J
TZST-0003SLOWVT: 36 J

## 2012-07-11 NOTE — Progress Notes (Signed)
ICD check with ICM in office. 

## 2012-08-16 ENCOUNTER — Other Ambulatory Visit: Payer: Self-pay | Admitting: Cardiology

## 2012-08-16 DIAGNOSIS — I2589 Other forms of chronic ischemic heart disease: Secondary | ICD-10-CM

## 2012-08-16 DIAGNOSIS — I5022 Chronic systolic (congestive) heart failure: Secondary | ICD-10-CM

## 2012-08-16 MED ORDER — SPIRONOLACTONE 25 MG PO TABS: ORAL_TABLET | ORAL | Status: DC

## 2012-09-10 ENCOUNTER — Telehealth: Payer: Self-pay | Admitting: *Deleted

## 2012-09-10 ENCOUNTER — Ambulatory Visit (INDEPENDENT_AMBULATORY_CARE_PROVIDER_SITE_OTHER): Payer: Medicare Other | Admitting: *Deleted

## 2012-09-10 DIAGNOSIS — I472 Ventricular tachycardia, unspecified: Secondary | ICD-10-CM

## 2012-09-10 DIAGNOSIS — I4891 Unspecified atrial fibrillation: Secondary | ICD-10-CM

## 2012-09-10 DIAGNOSIS — Z8674 Personal history of sudden cardiac arrest: Secondary | ICD-10-CM

## 2012-09-10 LAB — ICD DEVICE OBSERVATION
BAMS-0003: 70 {beats}/min
DEVICE MODEL ICD: 751523
TZAT-0001SLOWVT: 1
TZAT-0004SLOWVT: 8
TZAT-0012FASTVT: 200 ms
TZAT-0012SLOWVT: 200 ms
TZAT-0013FASTVT: 2
TZAT-0013SLOWVT: 2
TZAT-0018FASTVT: NEGATIVE
TZAT-0020FASTVT: 1 ms
TZON-0003FASTVT: 300 ms
TZON-0005FASTVT: 6
TZON-0005SLOWVT: 6
TZST-0001FASTVT: 2
TZST-0001FASTVT: 5
TZST-0001SLOWVT: 3
TZST-0003FASTVT: 30 J
TZST-0003FASTVT: 40 J
TZST-0003SLOWVT: 36 J
TZST-0003SLOWVT: 40 J

## 2012-09-10 NOTE — Progress Notes (Signed)
Pt seen in clinic due to episode with shock on 09-08-12. Pt felt woozy but never lost consciousness. Interrogation shows 41 VT-1 episodes with 97 ATP therapies mostly on 09-08-12. Dr Johney Frame reviewed and requested pt be seen by [facility] ASAP and no driving x 6 mths. Pt aware of both. ROV 09-11-12 @ 815 with [facility]. Pt aware of appointment.

## 2012-09-10 NOTE — Telephone Encounter (Signed)
Pt called in regarding shock on 09-08-12 around 3 pm. Pt has no transmitter and is seen in office only. Pt was advised no driving x 6 months. Pt would like to be seen today to evaluate episode. Pt was instructed to find a ride to office and can come in at any time.

## 2012-09-11 ENCOUNTER — Encounter: Payer: Self-pay | Admitting: Internal Medicine

## 2012-09-11 ENCOUNTER — Ambulatory Visit (INDEPENDENT_AMBULATORY_CARE_PROVIDER_SITE_OTHER): Payer: Medicare Other | Admitting: Internal Medicine

## 2012-09-11 VITALS — BP 116/78 | HR 59 | Ht 68.0 in | Wt 199.4 lb

## 2012-09-11 DIAGNOSIS — I5022 Chronic systolic (congestive) heart failure: Secondary | ICD-10-CM

## 2012-09-11 DIAGNOSIS — I472 Ventricular tachycardia, unspecified: Secondary | ICD-10-CM

## 2012-09-11 DIAGNOSIS — Z95810 Presence of automatic (implantable) cardiac defibrillator: Secondary | ICD-10-CM

## 2012-09-11 LAB — ICD DEVICE OBSERVATION
AL IMPEDENCE ICD: 387.5 Ohm
ATRIAL PACING ICD: 85 pct
DEVICE MODEL ICD: 751523
FVT: 0
LV LEAD IMPEDENCE ICD: 437.5 Ohm
RV LEAD AMPLITUDE: 12 mv
RV LEAD IMPEDENCE ICD: 575 Ohm
TOT-0007: 3
TOT-0008: 0
TOT-0009: 0
TZAT-0012SLOWVT: 200 ms
TZAT-0013FASTVT: 2
TZAT-0013SLOWVT: 4
TZAT-0018FASTVT: NEGATIVE
TZAT-0018SLOWVT: NEGATIVE
TZAT-0019FASTVT: 7.5 V
TZAT-0020FASTVT: 1 ms
TZAT-0020SLOWVT: 1 ms
TZON-0003FASTVT: 300 ms
TZON-0003SLOWVT: 375 ms
TZON-0004FASTVT: 30
TZON-0005FASTVT: 6
TZON-0005SLOWVT: 6
TZON-0010FASTVT: 80 ms
TZST-0001FASTVT: 2
TZST-0001FASTVT: 4
TZST-0001SLOWVT: 4
TZST-0003FASTVT: 30 J
TZST-0003FASTVT: 40 J
TZST-0003SLOWVT: 40 J
VENTRICULAR PACING ICD: 97 pct

## 2012-09-11 MED ORDER — AMIODARONE HCL 200 MG PO TABS: 200.0000 mg | ORAL_TABLET | Freq: Every day | ORAL | Status: DC

## 2012-09-11 MED ORDER — CARVEDILOL 25 MG PO TABS: 25.0000 mg | ORAL_TABLET | Freq: Two times a day (BID) | ORAL | Status: DC

## 2012-09-11 NOTE — Assessment & Plan Note (Signed)
We discussed the treatment options with this patient. I've recommended initiation of low-dose amiodarone. I considered using sotalol, but with his heart failure history, I think this would not be his best choice. In addition, his ICD is been reprogrammed to increase anti-tachycardic pacing therapies.

## 2012-09-11 NOTE — Assessment & Plan Note (Signed)
>>  ASSESSMENT AND PLAN FOR AUTOMATIC IMPLANTABLE CARDIOVERTER-DEFIBRILLATOR IN SITU WRITTEN ON 09/11/2012  8:51 AM BY WADDELL DANELLE ORN, MD  His St. Jude biventricular ICD is working normally. Today we increased his anti-tachycardic pacing therapies.

## 2012-09-11 NOTE — Assessment & Plan Note (Signed)
>>  ASSESSMENT AND PLAN FOR SYSTOLIC HEART FAILURE, CHRONIC WRITTEN ON 09/11/2012  8:51 AM BY WADDELL DANELLE ORN, MD  His heart failure symptoms remain class II. He will continue his current medical therapy, and maintain a low-sodium diet.

## 2012-09-11 NOTE — Patient Instructions (Signed)
Your physician recommends that you schedule a follow-up appointment in: 3 months with Dr Johney Frame   Your physician has recommended you make the following change in your medication:  1) Stop Amiodarone 200mg  daily

## 2012-09-11 NOTE — Assessment & Plan Note (Signed)
His St. Jude biventricular ICD is working normally. Today we increased his anti-tachycardic pacing therapies.

## 2012-09-11 NOTE — Progress Notes (Signed)
HPI [name] returns today for followup. He is a pleasant 79 yo man with of an ischemic cardiomyopathy, chronic systolic heart failure, and left bundle branch block, status post ICD implantation. The patient experienced an ICD shock several days ago. The patient felt dizzy and lightheaded before the shock. Since then he has felt well. He denies chest pain or worsening heart failure symptoms. He remains active. He does have a history of paroxysmal atrial fibrillation, and was unable take warfarin in the past, and could not afford to take any of the newer agents due to the price. No Active Allergies   Current Outpatient Prescriptions  Medication Sig Dispense Refill  . aspirin 325 MG tablet Take 325 mg by mouth daily.      [name] atorvastatin (LIPITOR) 20 MG tablet Take 1 tablet (20 mg total) by mouth daily.  90 tablet  4  . carvedilol (COREG) 25 MG tablet Take 1 tablet (25 mg total) by mouth 2 (two) times daily with a meal.  180 tablet  4  . enalapril (VASOTEC) 10 MG tablet Take 0.5 tablets (5 mg total) by mouth 2 (two) times daily.  90 tablet  4  . indomethacin (INDOCIN SR) 75 MG CR capsule Take 1 capsule (75 mg total) by mouth as needed.  30 capsule  1  . omeprazole (PRILOSEC) 20 MG capsule Take 20 mg by mouth daily.      [name] spironolactone (ALDACTONE) 25 MG tablet Take 1/2 tablet by mouth daily.  45 tablet  4  . Tamsulosin HCl (FLOMAX) 0.4 MG CAPS Take 0.4 mg by mouth daily.      [name] amiodarone (PACERONE) 200 MG tablet Take 1 tablet (200 mg total) by mouth daily.  90 tablet  3   No current facility-administered medications for this visit.     Past Medical History  Diagnosis Date  . Hx-sudden cardiac arrest   . LBBB (left bundle branch block)   . CAD (coronary artery disease)   . Ventricular tachycardia   . History of ventricular fibrillation   . Ischemic cardiomyopathy   . HTN (hypertension)   . Dyslipidemia   . CHF (congestive heart failure)   . HLD (hyperlipidemia)     ROS:   All  systems reviewed and negative except as noted in the HPI.   Past Surgical History  Procedure Laterality Date  . Cardiac defibrillator placement      St Jude  . Doppler echocardiography  2008, 2011  . Cardiac catheterization  10/12/2008  . Tonsilectomy, adenoidectomy, bilateral myringotomy and tubes       Family History  Problem Relation Age of Onset  . Coronary artery disease Other      History   Social History  . Marital Status: Divorced    Spouse Name: N/A    Number of Children: N/A  . Years of Education: N/A   Occupational History  . furniture truck driver    Social History Main Topics  . Smoking status: Former Smoker    Quit date: 01/03/1985  . Smokeless tobacco: Not on file  . Alcohol Use: Not on file  . Drug Use: Not on file  . Sexual Activity: Not on file   Other Topics Concern  . Not on file   Social History Narrative  . No narrative on file     BP 116/78  Pulse 59  Ht 5\' 8"  (1.727 m)  Wt 199 lb 6.4 oz (90.447 kg)  BMI 30.33 kg/m2  Physical Exam:  Well  appearing 79 year old man, NAD HEENT: Unremarkable Neck:  No JVD, no thyromegally Back:  No CVA tenderness Lungs:  Clear with no wheezes, rales, or rhonchi. HEART:  Regular rate rhythm, no murmurs, no rubs, no clicks Abd:  soft, positive bowel sounds, no organomegally, no rebound, no guarding Ext:  2 plus pulses, no edema, no cyanosis, no clubbing Skin:  No rashes no nodules Neuro:  CN II through XII intact, motor grossly intact  EKG - normal sinus rhythm with biventricular pacing  DEVICE  Normal device function.  See PaceArt for details.   Assess/Plan:

## 2012-09-11 NOTE — Assessment & Plan Note (Signed)
His heart failure symptoms remain class II. He will continue his current medical therapy, and maintain a low-sodium diet. 

## 2012-09-17 ENCOUNTER — Encounter: Payer: Self-pay | Admitting: Internal Medicine

## 2012-12-11 ENCOUNTER — Ambulatory Visit (INDEPENDENT_AMBULATORY_CARE_PROVIDER_SITE_OTHER): Payer: Medicare Other | Admitting: Internal Medicine

## 2012-12-11 ENCOUNTER — Encounter: Payer: Self-pay | Admitting: Internal Medicine

## 2012-12-11 ENCOUNTER — Encounter (INDEPENDENT_AMBULATORY_CARE_PROVIDER_SITE_OTHER): Payer: Self-pay

## 2012-12-11 VITALS — BP 118/74 | HR 54 | Ht 68.0 in | Wt 203.8 lb

## 2012-12-11 DIAGNOSIS — I2589 Other forms of chronic ischemic heart disease: Secondary | ICD-10-CM

## 2012-12-11 DIAGNOSIS — I5022 Chronic systolic (congestive) heart failure: Secondary | ICD-10-CM

## 2012-12-11 DIAGNOSIS — I472 Ventricular tachycardia, unspecified: Secondary | ICD-10-CM

## 2012-12-11 DIAGNOSIS — Z95810 Presence of automatic (implantable) cardiac defibrillator: Secondary | ICD-10-CM

## 2012-12-11 LAB — MDC_IDC_ENUM_SESS_TYPE_INCLINIC
Battery Remaining Longevity: 40.8 mo
Brady Statistic RA Percent Paced: 98 %
Brady Statistic RV Percent Paced: 99 %
Date Time Interrogation Session: 20141209092157
Implantable Pulse Generator Serial Number: 751523
Lead Channel Impedance Value: 475 Ohm
Lead Channel Impedance Value: 625 Ohm
Lead Channel Pacing Threshold Amplitude: 0.75 V
Lead Channel Pacing Threshold Pulse Width: 0.5 ms
Lead Channel Pacing Threshold Pulse Width: 0.6 ms
Lead Channel Sensing Intrinsic Amplitude: 12 mV
Lead Channel Setting Sensing Sensitivity: 0.5 mV
Zone Setting Detection Interval: 260 ms
Zone Setting Detection Interval: 300 ms

## 2012-12-11 MED ORDER — AMIODARONE HCL 200 MG PO TABS: 200.0000 mg | ORAL_TABLET | Freq: Every day | ORAL | Status: DC

## 2012-12-11 NOTE — Assessment & Plan Note (Signed)
>>  ASSESSMENT AND PLAN FOR AUTOMATIC IMPLANTABLE CARDIOVERTER-DEFIBRILLATOR IN SITU WRITTEN ON 12/11/2012  9:08 AM BY WADDELL DANELLE ORN, MD  His St. Jude device is working normally. We'll plan to recheck in several months.

## 2012-12-11 NOTE — Progress Notes (Signed)
HPI Mark Nguyen returns today for followup. He is a pleasant 79 yo man with of an ischemic cardiomyopathy, chronic systolic heart failure, and left bundle branch block, status post ICD implantation. He denies chest pain or worsening heart failure symptoms. He remains active. He does have a history of paroxysmal atrial fibrillation, and was unable take warfarin in the past, and could not afford to take any of the newer agents due to the price. He presented 2 months ago with increasing ventricular tachycardia and ICD shocks. He was prescribed amiodarone 200 mg twice daily with instructions to reduce the dose down to 200 mg daily. Within a month of initiating amiodarone, his ventricular tachycardia resolved. He has done well since. He denies chest pain or shortness of breath. No Known Allergies   Current Outpatient Prescriptions  Medication Sig Dispense Refill  . amiodarone (PACERONE) 200 MG tablet Take 1 tablet (200 mg total) by mouth daily.  90 tablet  3  . aspirin 325 MG tablet Take 325 mg by mouth daily.      [name] atorvastatin (LIPITOR) 20 MG tablet Take 1 tablet (20 mg total) by mouth daily.  90 tablet  4  . carvedilol (COREG) 25 MG tablet Take 1 tablet (25 mg total) by mouth 2 (two) times daily with a meal.  180 tablet  4  . enalapril (VASOTEC) 10 MG tablet Take 0.5 tablets (5 mg total) by mouth 2 (two) times daily.  90 tablet  4  . spironolactone (ALDACTONE) 25 MG tablet Take 1/2 tablet by mouth daily.  45 tablet  4  . Tamsulosin HCl (FLOMAX) 0.4 MG CAPS Take 0.4 mg by mouth daily.      [name] omeprazole (PRILOSEC) 20 MG capsule Take 20 mg by mouth daily.       No current facility-administered medications for this visit.     Past Medical History  Diagnosis Date  . Hx-sudden cardiac arrest   . LBBB (left bundle branch block)   . CAD (coronary artery disease)   . Ventricular tachycardia   . History of ventricular fibrillation   . Ischemic cardiomyopathy   . HTN (hypertension)   . Dyslipidemia    . CHF (congestive heart failure)   . HLD (hyperlipidemia)     ROS:   All systems reviewed and negative except as noted in the HPI.   Past Surgical History  Procedure Laterality Date  . Cardiac defibrillator placement      St Jude  . Doppler echocardiography  2008, 2011  . Cardiac catheterization  10/12/2008  . Tonsilectomy, adenoidectomy, bilateral myringotomy and tubes       Family History  Problem Relation Age of Onset  . Coronary artery disease Other      History   Social History  . Marital Status: Divorced    Spouse Name: N/A    Number of Children: N/A  . Years of Education: N/A   Occupational History  . furniture truck driver    Social History Main Topics  . Smoking status: Former Smoker    Quit date: 01/03/1985  . Smokeless tobacco: Not on file  . Alcohol Use: Not on file  . Drug Use: Not on file  . Sexual Activity: Not on file   Other Topics Concern  . Not on file   Social History Narrative  . No narrative on file     BP 118/74  Pulse 54  Ht 5\' 8"  (1.727 m)  Wt 203 lb 12.8 oz (92.443 kg)  BMI 30.99  kg/m2  Physical Exam:  Well appearing 79 year old man, NAD HEENT: Unremarkable Neck:  No JVD, no thyromegally Back:  No CVA tenderness Lungs:  Clear with no wheezes, rales, or rhonchi. HEART:  Regular rate rhythm, no murmurs, no rubs, no clicks Abd:  soft, positive bowel sounds, no organomegally, no rebound, no guarding Ext:  2 plus pulses, no edema, no cyanosis, no clubbing Skin:  No rashes no nodules Neuro:  CN II through XII intact, motor grossly intact   DEVICE  Normal device function.  See PaceArt for details.   Assess/Plan:

## 2012-12-11 NOTE — Assessment & Plan Note (Signed)
His St. Jude device is working normally. We'll plan to recheck in several months. 

## 2012-12-11 NOTE — Patient Instructions (Addendum)
Your physician wants you to follow-up in: 9 months with [name] will receive a reminder letter in the mail two months in advance. If you don't receive a letter, please call our office to schedule the follow-up appointment.   Remote monitoring is used to monitor your Pacemaker or ICD from home. This monitoring reduces the number of office visits required to check your device to one time per year. It allows [name] to keep an eye on the functioning of your device to ensure it is working properly. You are scheduled for a device check from home on 03/14/13. You may send your transmission at any time that day. If you have a wireless device, the transmission will be sent automatically. After your physician reviews your transmission, you will receive a postcard with your next transmission date.   Your physician has recommended you make the following change in your medication:  1) Decrease Amiodarone to 200mg  daily

## 2012-12-11 NOTE — Assessment & Plan Note (Signed)
His ventricular tachycardia is well controlled. He is instructed not to drive. He will continue amiodarone.

## 2013-02-04 ENCOUNTER — Other Ambulatory Visit: Payer: Self-pay | Admitting: Internal Medicine

## 2013-03-14 ENCOUNTER — Ambulatory Visit (INDEPENDENT_AMBULATORY_CARE_PROVIDER_SITE_OTHER): Payer: Medicare Other | Admitting: *Deleted

## 2013-03-14 DIAGNOSIS — I472 Ventricular tachycardia, unspecified: Secondary | ICD-10-CM

## 2013-03-14 DIAGNOSIS — Z8674 Personal history of sudden cardiac arrest: Secondary | ICD-10-CM

## 2013-03-14 DIAGNOSIS — I4891 Unspecified atrial fibrillation: Secondary | ICD-10-CM

## 2013-03-14 DIAGNOSIS — I5022 Chronic systolic (congestive) heart failure: Secondary | ICD-10-CM

## 2013-03-14 DIAGNOSIS — I4729 Other ventricular tachycardia: Secondary | ICD-10-CM

## 2013-03-14 LAB — MDC_IDC_ENUM_SESS_TYPE_REMOTE
Battery Remaining Percentage: 44 %
Battery Voltage: 2.86 V
Brady Statistic AP VP Percent: 99 %
Brady Statistic AS VP Percent: 1 %
HighPow Impedance: 47 Ohm
Implantable Pulse Generator Serial Number: 751523
Lead Channel Impedance Value: 610 Ohm
Lead Channel Pacing Threshold Amplitude: 1 V
Lead Channel Pacing Threshold Pulse Width: 0.5 ms
Lead Channel Pacing Threshold Pulse Width: 0.6 ms
Lead Channel Sensing Intrinsic Amplitude: 12 mV
Lead Channel Setting Pacing Amplitude: 2 V
Lead Channel Setting Pacing Pulse Width: 0.5 ms
Lead Channel Setting Pacing Pulse Width: 0.5 ms
MDC IDC MSMT BATTERY REMAINING LONGEVITY: 37 mo
MDC IDC MSMT LEADCHNL LV IMPEDANCE VALUE: 460 Ohm
MDC IDC MSMT LEADCHNL LV PACING THRESHOLD AMPLITUDE: 0.625 V
MDC IDC MSMT LEADCHNL RA IMPEDANCE VALUE: 390 Ohm
MDC IDC MSMT LEADCHNL RA SENSING INTR AMPL: 2.3 mV
MDC IDC MSMT LEADCHNL RV PACING THRESHOLD AMPLITUDE: 0.75 V
MDC IDC MSMT LEADCHNL RV PACING THRESHOLD PULSEWIDTH: 0.5 ms
MDC IDC SESS DTM: 20150312065407
MDC IDC SET LEADCHNL RA PACING AMPLITUDE: 2 V
MDC IDC SET LEADCHNL RV PACING AMPLITUDE: 2.5 V
MDC IDC SET LEADCHNL RV SENSING SENSITIVITY: 0.5 mV
MDC IDC SET ZONE DETECTION INTERVAL: 260 ms
MDC IDC SET ZONE DETECTION INTERVAL: 375 ms
MDC IDC STAT BRADY AP VS PERCENT: 1 %
MDC IDC STAT BRADY AS VS PERCENT: 1 %
MDC IDC STAT BRADY RA PERCENT PACED: 98 %
Zone Setting Detection Interval: 300 ms

## 2013-03-26 ENCOUNTER — Encounter: Payer: Self-pay | Admitting: Internal Medicine

## 2013-04-03 ENCOUNTER — Encounter: Payer: Self-pay | Admitting: *Deleted

## 2013-04-27 ENCOUNTER — Encounter: Payer: Self-pay | Admitting: Internal Medicine

## 2013-05-24 ENCOUNTER — Encounter: Payer: Self-pay | Admitting: Internal Medicine

## 2013-06-17 ENCOUNTER — Ambulatory Visit (INDEPENDENT_AMBULATORY_CARE_PROVIDER_SITE_OTHER): Payer: Medicare Other | Admitting: *Deleted

## 2013-06-17 ENCOUNTER — Encounter: Payer: Self-pay | Admitting: Internal Medicine

## 2013-06-17 DIAGNOSIS — I472 Ventricular tachycardia: Secondary | ICD-10-CM

## 2013-06-17 DIAGNOSIS — I4729 Other ventricular tachycardia: Secondary | ICD-10-CM

## 2013-06-17 DIAGNOSIS — I5022 Chronic systolic (congestive) heart failure: Secondary | ICD-10-CM

## 2013-06-17 LAB — MDC_IDC_ENUM_SESS_TYPE_INCLINIC
Brady Statistic RA Percent Paced: 99 %
Brady Statistic RV Percent Paced: 100 %
Implantable Pulse Generator Serial Number: 751523
Lead Channel Pacing Threshold Amplitude: 0.75 V
Lead Channel Sensing Intrinsic Amplitude: 3 mV
Lead Channel Setting Pacing Amplitude: 2 V
Lead Channel Setting Pacing Amplitude: 2 V
Lead Channel Setting Pacing Pulse Width: 0.5 ms
Lead Channel Setting Sensing Sensitivity: 0.5 mV
MDC IDC MSMT BATTERY REMAINING LONGEVITY: 21 mo
MDC IDC MSMT LEADCHNL LV IMPEDANCE VALUE: 440 Ohm
MDC IDC MSMT LEADCHNL LV PACING THRESHOLD PULSEWIDTH: 0.5 ms
MDC IDC MSMT LEADCHNL RA IMPEDANCE VALUE: 400 Ohm
MDC IDC MSMT LEADCHNL RV IMPEDANCE VALUE: 550 Ohm
MDC IDC MSMT LEADCHNL RV SENSING INTR AMPL: 12 mV
MDC IDC SET LEADCHNL RV PACING AMPLITUDE: 2.5 V
MDC IDC SET LEADCHNL RV PACING PULSEWIDTH: 0.5 ms
MDC IDC SET ZONE DETECTION INTERVAL: 260 ms
Zone Setting Detection Interval: 300 ms
Zone Setting Detection Interval: 375 ms

## 2013-06-19 NOTE — Progress Notes (Signed)
Remote ICD transmission.   

## 2013-06-24 ENCOUNTER — Other Ambulatory Visit: Payer: Self-pay | Admitting: Internal Medicine

## 2013-07-12 ENCOUNTER — Encounter: Payer: Self-pay | Admitting: Cardiology

## 2013-08-27 ENCOUNTER — Other Ambulatory Visit: Payer: Self-pay | Admitting: Internal Medicine

## 2013-09-10 ENCOUNTER — Ambulatory Visit (INDEPENDENT_AMBULATORY_CARE_PROVIDER_SITE_OTHER): Payer: Medicare Other | Admitting: Internal Medicine

## 2013-09-10 ENCOUNTER — Encounter: Payer: Self-pay | Admitting: Internal Medicine

## 2013-09-10 VITALS — BP 94/72 | HR 60 | Ht 68.0 in | Wt 199.8 lb

## 2013-09-10 DIAGNOSIS — Z95810 Presence of automatic (implantable) cardiac defibrillator: Secondary | ICD-10-CM

## 2013-09-10 DIAGNOSIS — I5022 Chronic systolic (congestive) heart failure: Secondary | ICD-10-CM

## 2013-09-10 DIAGNOSIS — I4891 Unspecified atrial fibrillation: Secondary | ICD-10-CM

## 2013-09-10 DIAGNOSIS — I480 Paroxysmal atrial fibrillation: Secondary | ICD-10-CM

## 2013-09-10 DIAGNOSIS — I4729 Other ventricular tachycardia: Secondary | ICD-10-CM

## 2013-09-10 DIAGNOSIS — I472 Ventricular tachycardia: Secondary | ICD-10-CM

## 2013-09-10 DIAGNOSIS — I2589 Other forms of chronic ischemic heart disease: Secondary | ICD-10-CM

## 2013-09-10 LAB — MDC_IDC_ENUM_SESS_TYPE_INCLINIC
Battery Remaining Longevity: 16.8 mo
Brady Statistic RV Percent Paced: 99.25 %
Date Time Interrogation Session: 20150908095041
HighPow Impedance: 45.5693
Implantable Pulse Generator Serial Number: 751523
Lead Channel Impedance Value: 387.5 Ohm
Lead Channel Impedance Value: 475 Ohm
Lead Channel Pacing Threshold Amplitude: 0.75 V
Lead Channel Pacing Threshold Amplitude: 0.75 V
Lead Channel Pacing Threshold Amplitude: 1 V
Lead Channel Pacing Threshold Pulse Width: 0.5 ms
Lead Channel Pacing Threshold Pulse Width: 0.5 ms
Lead Channel Pacing Threshold Pulse Width: 0.6 ms
Lead Channel Sensing Intrinsic Amplitude: 2.5 mV
Lead Channel Setting Pacing Amplitude: 2 V
Lead Channel Setting Pacing Amplitude: 2.5 V
Lead Channel Setting Sensing Sensitivity: 0.5 mV
MDC IDC MSMT LEADCHNL RV IMPEDANCE VALUE: 637.5 Ohm
MDC IDC MSMT LEADCHNL RV SENSING INTR AMPL: 12 mV
MDC IDC SET LEADCHNL LV PACING AMPLITUDE: 2 V
MDC IDC SET LEADCHNL LV PACING PULSEWIDTH: 0.5 ms
MDC IDC SET LEADCHNL RV PACING PULSEWIDTH: 0.5 ms
MDC IDC STAT BRADY RA PERCENT PACED: 99 %
Zone Setting Detection Interval: 260 ms
Zone Setting Detection Interval: 300 ms
Zone Setting Detection Interval: 375 ms

## 2013-09-10 MED ORDER — AMIODARONE HCL 200 MG PO TABS: ORAL_TABLET | ORAL | Status: DC

## 2013-09-10 MED ORDER — CARVEDILOL 25 MG PO TABS: 25.0000 mg | ORAL_TABLET | Freq: Two times a day (BID) | ORAL | Status: DC

## 2013-09-10 MED ORDER — ATORVASTATIN CALCIUM 20 MG PO TABS: ORAL_TABLET | ORAL | Status: DC

## 2013-09-10 MED ORDER — ENALAPRIL MALEATE 10 MG PO TABS: ORAL_TABLET | ORAL | Status: DC

## 2013-09-10 MED ORDER — SPIRONOLACTONE 25 MG PO TABS: ORAL_TABLET | ORAL | Status: DC

## 2013-09-10 NOTE — Assessment & Plan Note (Signed)
His St. Jude ICD is working normally. Will follow.

## 2013-09-10 NOTE — Assessment & Plan Note (Signed)
>>  ASSESSMENT AND PLAN FOR AUTOMATIC IMPLANTABLE CARDIOVERTER-DEFIBRILLATOR IN SITU WRITTEN ON 09/10/2013  9:39 AM BY WADDELL DANELLE ORN, MD  His St. Jude ICD is working normally. Will follow.

## 2013-09-10 NOTE — Assessment & Plan Note (Signed)
>>  ASSESSMENT AND PLAN FOR SYSTOLIC HEART FAILURE, CHRONIC WRITTEN ON 09/10/2013  9:38 AM BY WADDELL DANELLE ORN, MD  His symptoms are class 2. He will continue his current meds.

## 2013-09-10 NOTE — Progress Notes (Signed)
HPI Mark Nguyen returns today for followup. He is a pleasant 80 yo man with of an ischemic cardiomyopathy, chronic systolic heart failure, and left bundle branch block, status post ICD implantation. He denies chest pain or worsening heart failure symptoms. He remains active. He has had VT which is well controlled on amiodarone.  He has done well but notes that his daughter died a few weeks ago after a fall. He denies chest pain or shortness of breath. No Known Allergies   Current Outpatient Prescriptions  Medication Sig Dispense Refill  . amiodarone (PACERONE) 200 MG tablet TAKE ONE (1) TABLET EACH DAY  90 tablet  0  . aspirin 325 MG tablet Take 325 mg by mouth daily.      [name] atorvastatin (LIPITOR) 20 MG tablet TAKE ONE (1) TABLET EACH DAY  90 tablet  1  . carvedilol (COREG) 25 MG tablet Take 1 tablet (25 mg total) by mouth 2 (two) times daily with a meal.  180 tablet  4  . Cholecalciferol (VITAMIN D-3) 1000 UNITS CAPS Take 1 capsule by mouth daily.      . enalapril (VASOTEC) 10 MG tablet TAKE ONE-HALF TABLET BY MOUTH TWICE DAILY.  90 tablet  1  . omeprazole (PRILOSEC) 20 MG capsule Take 20 mg by mouth daily.      [name] spironolactone (ALDACTONE) 25 MG tablet Take 1/2 tablet by mouth daily.  45 tablet  4  . Tamsulosin HCl (FLOMAX) 0.4 MG CAPS Take 0.4 mg by mouth daily.       No current facility-administered medications for this visit.     Past Medical History  Diagnosis Date  . Hx-sudden cardiac arrest   . LBBB (left bundle branch block)   . CAD (coronary artery disease)   . Ventricular tachycardia   . History of ventricular fibrillation   . Ischemic cardiomyopathy   . HTN (hypertension)   . Dyslipidemia   . CHF (congestive heart failure)   . HLD (hyperlipidemia)     ROS:   All systems reviewed and negative except as noted in the HPI.   Past Surgical History  Procedure Laterality Date  . Cardiac defibrillator placement      St Jude  . Doppler echocardiography  2008, 2011  .  Cardiac catheterization  10/12/2008  . Tonsilectomy, adenoidectomy, bilateral myringotomy and tubes       Family History  Problem Relation Age of Onset  . Coronary artery disease Other      History   Social History  . Marital Status: Divorced    Spouse Name: N/A    Number of Children: N/A  . Years of Education: N/A   Occupational History  . furniture truck driver    Social History Main Topics  . Smoking status: Former Smoker    Quit date: 01/03/1985  . Smokeless tobacco: Not on file  . Alcohol Use: Not on file  . Drug Use: Not on file  . Sexual Activity: Not on file   Other Topics Concern  . Not on file   Social History Narrative  . No narrative on file     BP 94/72  Pulse 60  Ht 5\' 8"  (1.727 m)  Wt 199 lb 12.8 oz (90.629 kg)  BMI 30.39 kg/m2  Physical Exam:  Well appearing overweight, 80 year old man, NAD HEENT: Unremarkable Neck:  No JVD, no thyromegally Back:  No CVA tenderness Lungs:  Clear with no wheezes, rales, or rhonchi. HEART:  Regular rate rhythm, no murmurs, no rubs, no  clicks Abd:  soft, positive bowel sounds, no organomegally, no rebound, no guarding Ext:  2 plus pulses, no edema, no cyanosis, no clubbing Skin:  No rashes no nodules Neuro:  CN II through XII intact, motor grossly intact   DEVICE  Normal device function.  See PaceArt for details.   Assess/Plan:

## 2013-09-10 NOTE — Patient Instructions (Signed)
Your physician wants you to follow-up in: 12 months with Dr Knox Saliva will receive a reminder letter in the mail two months in advance. If you don't receive a letter, please call our office to schedule the follow-up appointment.    Remote monitoring is used to monitor your Pacemaker or ICD from home. This monitoring reduces the number of office visits required to check your device to one time per year. It allows [name] to keep an eye on the functioning of your device to ensure it is working properly. You are scheduled for a device check from home on 12/12/13. You may send your transmission at any time that day. If you have a wireless device, the transmission will be sent automatically. After your physician reviews your transmission, you will receive a postcard with your next transmission date.

## 2013-09-10 NOTE — Assessment & Plan Note (Signed)
He has had a single episode since May. He will continue low dose amiodarone.

## 2013-09-10 NOTE — Assessment & Plan Note (Signed)
His symptoms are class 2. He will continue his current meds.  

## 2013-12-12 ENCOUNTER — Encounter: Payer: Self-pay | Admitting: Internal Medicine

## 2013-12-12 ENCOUNTER — Other Ambulatory Visit: Payer: Self-pay | Admitting: Internal Medicine

## 2013-12-12 ENCOUNTER — Other Ambulatory Visit: Payer: Self-pay

## 2013-12-12 ENCOUNTER — Ambulatory Visit (INDEPENDENT_AMBULATORY_CARE_PROVIDER_SITE_OTHER): Payer: Medicare Other | Admitting: *Deleted

## 2013-12-12 DIAGNOSIS — I4729 Other ventricular tachycardia: Secondary | ICD-10-CM

## 2013-12-12 DIAGNOSIS — I472 Ventricular tachycardia: Secondary | ICD-10-CM

## 2013-12-12 DIAGNOSIS — I5022 Chronic systolic (congestive) heart failure: Secondary | ICD-10-CM

## 2013-12-12 NOTE — Progress Notes (Signed)
Remote ICD transmission.   

## 2013-12-18 LAB — MDC_IDC_ENUM_SESS_TYPE_REMOTE
Brady Statistic AP VS Percent: 1 %
Brady Statistic AS VS Percent: 1 %
Date Time Interrogation Session: 20151210081906
HIGH POWER IMPEDANCE MEASURED VALUE: 46 Ohm
Lead Channel Impedance Value: 360 Ohm
Lead Channel Impedance Value: 380 Ohm
Lead Channel Pacing Threshold Amplitude: 0.75 V
Lead Channel Pacing Threshold Amplitude: 0.875 V
Lead Channel Pacing Threshold Pulse Width: 0.5 ms
Lead Channel Pacing Threshold Pulse Width: 0.6 ms
Lead Channel Sensing Intrinsic Amplitude: 0.8 mV
Lead Channel Setting Pacing Amplitude: 2 V
Lead Channel Setting Pacing Amplitude: 2 V
Lead Channel Setting Pacing Amplitude: 2.5 V
Lead Channel Setting Sensing Sensitivity: 0.5 mV
MDC IDC MSMT BATTERY REMAINING LONGEVITY: 11 mo
MDC IDC MSMT BATTERY REMAINING PERCENTAGE: 13 %
MDC IDC MSMT BATTERY VOLTAGE: 2.71 V
MDC IDC MSMT LEADCHNL LV PACING THRESHOLD PULSEWIDTH: 0.5 ms
MDC IDC MSMT LEADCHNL RA PACING THRESHOLD AMPLITUDE: 1 V
MDC IDC MSMT LEADCHNL RV IMPEDANCE VALUE: 580 Ohm
MDC IDC MSMT LEADCHNL RV SENSING INTR AMPL: 12 mV
MDC IDC PG SERIAL: 751523
MDC IDC SET LEADCHNL LV PACING PULSEWIDTH: 0.5 ms
MDC IDC SET LEADCHNL RV PACING PULSEWIDTH: 0.5 ms
MDC IDC SET ZONE DETECTION INTERVAL: 375 ms
MDC IDC STAT BRADY AP VP PERCENT: 99 %
MDC IDC STAT BRADY AS VP PERCENT: 1 %
MDC IDC STAT BRADY RA PERCENT PACED: 98 %
Zone Setting Detection Interval: 260 ms
Zone Setting Detection Interval: 300 ms

## 2013-12-20 ENCOUNTER — Encounter: Payer: Self-pay | Admitting: Cardiology

## 2014-01-10 DIAGNOSIS — R351 Nocturia: Secondary | ICD-10-CM | POA: Diagnosis not present

## 2014-01-10 DIAGNOSIS — C61 Malignant neoplasm of prostate: Secondary | ICD-10-CM | POA: Diagnosis not present

## 2014-01-10 DIAGNOSIS — N3090 Cystitis, unspecified without hematuria: Secondary | ICD-10-CM | POA: Diagnosis not present

## 2014-01-10 DIAGNOSIS — N401 Enlarged prostate with lower urinary tract symptoms: Secondary | ICD-10-CM | POA: Diagnosis not present

## 2014-01-10 DIAGNOSIS — R5383 Other fatigue: Secondary | ICD-10-CM | POA: Diagnosis not present

## 2014-03-11 ENCOUNTER — Other Ambulatory Visit: Payer: Self-pay | Admitting: Internal Medicine

## 2014-03-17 ENCOUNTER — Ambulatory Visit (INDEPENDENT_AMBULATORY_CARE_PROVIDER_SITE_OTHER): Payer: Medicare Other | Admitting: *Deleted

## 2014-03-17 DIAGNOSIS — I5022 Chronic systolic (congestive) heart failure: Secondary | ICD-10-CM

## 2014-03-17 DIAGNOSIS — I472 Ventricular tachycardia, unspecified: Secondary | ICD-10-CM

## 2014-03-17 LAB — MDC_IDC_ENUM_SESS_TYPE_REMOTE
Battery Remaining Longevity: 6 mo
Battery Voltage: 2.65 V
Brady Statistic AP VP Percent: 99 %
Brady Statistic AP VS Percent: 1 %
Brady Statistic AS VP Percent: 1 %
Brady Statistic AS VS Percent: 1 %
Brady Statistic RA Percent Paced: 98 %
Date Time Interrogation Session: 20160314065233
HighPow Impedance: 48 Ohm
Implantable Pulse Generator Serial Number: 751523
Lead Channel Impedance Value: 380 Ohm
Lead Channel Pacing Threshold Amplitude: 0.75 V
Lead Channel Pacing Threshold Amplitude: 0.875 V
Lead Channel Pacing Threshold Pulse Width: 0.5 ms
Lead Channel Pacing Threshold Pulse Width: 0.5 ms
Lead Channel Pacing Threshold Pulse Width: 0.6 ms
Lead Channel Sensing Intrinsic Amplitude: 0.8 mV
Lead Channel Setting Pacing Amplitude: 2 V
Lead Channel Setting Pacing Amplitude: 2.5 V
Lead Channel Setting Pacing Pulse Width: 0.5 ms
MDC IDC MSMT BATTERY REMAINING PERCENTAGE: 7 %
MDC IDC MSMT LEADCHNL LV IMPEDANCE VALUE: 380 Ohm
MDC IDC MSMT LEADCHNL RA PACING THRESHOLD AMPLITUDE: 1 V
MDC IDC MSMT LEADCHNL RV IMPEDANCE VALUE: 580 Ohm
MDC IDC MSMT LEADCHNL RV SENSING INTR AMPL: 12 mV
MDC IDC SET LEADCHNL LV PACING AMPLITUDE: 2 V
MDC IDC SET LEADCHNL RV PACING PULSEWIDTH: 0.5 ms
MDC IDC SET LEADCHNL RV SENSING SENSITIVITY: 0.5 mV
Zone Setting Detection Interval: 260 ms
Zone Setting Detection Interval: 300 ms
Zone Setting Detection Interval: 375 ms

## 2014-03-17 NOTE — Progress Notes (Signed)
Remote ICD transmission.   

## 2014-03-20 ENCOUNTER — Encounter: Payer: Self-pay | Admitting: Cardiology

## 2014-04-01 ENCOUNTER — Encounter: Payer: Self-pay | Admitting: Internal Medicine

## 2014-05-02 ENCOUNTER — Telehealth: Payer: Self-pay | Admitting: *Deleted

## 2014-05-02 NOTE — Telephone Encounter (Signed)
Spoke with patient- notified device is ERI and that he will be getting a call from [facility] to schedule GT follow up. Pt verbalizes understanding.

## 2014-05-12 DIAGNOSIS — N401 Enlarged prostate with lower urinary tract symptoms: Secondary | ICD-10-CM | POA: Diagnosis not present

## 2014-05-12 DIAGNOSIS — R351 Nocturia: Secondary | ICD-10-CM | POA: Diagnosis not present

## 2014-05-12 DIAGNOSIS — C61 Malignant neoplasm of prostate: Secondary | ICD-10-CM | POA: Diagnosis not present

## 2014-05-12 DIAGNOSIS — R5383 Other fatigue: Secondary | ICD-10-CM | POA: Diagnosis not present

## 2014-05-15 ENCOUNTER — Encounter: Payer: Self-pay | Admitting: Internal Medicine

## 2014-05-15 ENCOUNTER — Ambulatory Visit (INDEPENDENT_AMBULATORY_CARE_PROVIDER_SITE_OTHER): Payer: Medicare Other | Admitting: Internal Medicine

## 2014-05-15 VITALS — BP 130/60 | HR 60 | Ht 65.0 in | Wt 200.6 lb

## 2014-05-15 DIAGNOSIS — I5022 Chronic systolic (congestive) heart failure: Secondary | ICD-10-CM | POA: Diagnosis not present

## 2014-05-15 DIAGNOSIS — I472 Ventricular tachycardia: Secondary | ICD-10-CM

## 2014-05-15 DIAGNOSIS — I4729 Other ventricular tachycardia: Secondary | ICD-10-CM

## 2014-05-15 DIAGNOSIS — Z95810 Presence of automatic (implantable) cardiac defibrillator: Secondary | ICD-10-CM

## 2014-05-15 DIAGNOSIS — Z01818 Encounter for other preprocedural examination: Secondary | ICD-10-CM

## 2014-05-15 DIAGNOSIS — I480 Paroxysmal atrial fibrillation: Secondary | ICD-10-CM | POA: Diagnosis not present

## 2014-05-15 LAB — CUP PACEART INCLINIC DEVICE CHECK
Brady Statistic RA Percent Paced: 98 %
Brady Statistic RV Percent Paced: 99 %
HIGH POWER IMPEDANCE MEASURED VALUE: 50.8026
Lead Channel Impedance Value: 387.5 Ohm
Lead Channel Impedance Value: 637.5 Ohm
Lead Channel Pacing Threshold Amplitude: 0.5 V
Lead Channel Pacing Threshold Amplitude: 1.25 V
Lead Channel Pacing Threshold Pulse Width: 0.5 ms
Lead Channel Pacing Threshold Pulse Width: 0.6 ms
Lead Channel Sensing Intrinsic Amplitude: 2.3 mV
Lead Channel Setting Pacing Amplitude: 2.5 V
Lead Channel Setting Pacing Pulse Width: 0.5 ms
Lead Channel Setting Pacing Pulse Width: 0.5 ms
Lead Channel Setting Sensing Sensitivity: 0.5 mV
MDC IDC MSMT BATTERY REMAINING LONGEVITY: 0 mo
MDC IDC MSMT LEADCHNL LV PACING THRESHOLD AMPLITUDE: 0.75 V
MDC IDC MSMT LEADCHNL RA IMPEDANCE VALUE: 400 Ohm
MDC IDC MSMT LEADCHNL RV PACING THRESHOLD PULSEWIDTH: 0.5 ms
MDC IDC MSMT LEADCHNL RV SENSING INTR AMPL: 12 mV
MDC IDC PG SERIAL: 751523
MDC IDC SESS DTM: 20160512090221
MDC IDC SET LEADCHNL LV PACING AMPLITUDE: 2 V
MDC IDC SET LEADCHNL RA PACING AMPLITUDE: 2 V
MDC IDC SET ZONE DETECTION INTERVAL: 300 ms
Zone Setting Detection Interval: 260 ms
Zone Setting Detection Interval: 375 ms

## 2014-05-15 LAB — CBC WITH DIFFERENTIAL/PLATELET
BASOS PCT: 0.7 % (ref 0.0–3.0)
Basophils Absolute: 0 10*3/uL (ref 0.0–0.1)
Eosinophils Absolute: 0.1 10*3/uL (ref 0.0–0.7)
Eosinophils Relative: 1.4 % (ref 0.0–5.0)
HCT: 38.3 % — ABNORMAL LOW (ref 39.0–52.0)
Hemoglobin: 12.9 g/dL — ABNORMAL LOW (ref 13.0–17.0)
Lymphocytes Relative: 18.8 % (ref 12.0–46.0)
Lymphs Abs: 1.1 10*3/uL (ref 0.7–4.0)
MCHC: 33.8 g/dL (ref 30.0–36.0)
MCV: 84.8 fl (ref 78.0–100.0)
MONO ABS: 0.6 10*3/uL (ref 0.1–1.0)
Monocytes Relative: 10.4 % (ref 3.0–12.0)
NEUTROS PCT: 68.7 % (ref 43.0–77.0)
Neutro Abs: 3.9 10*3/uL (ref 1.4–7.7)
Platelets: 164 10*3/uL (ref 150.0–400.0)
RBC: 4.52 Mil/uL (ref 4.22–5.81)
RDW: 15.7 % — AB (ref 11.5–15.5)
WBC: 5.7 10*3/uL (ref 4.0–10.5)

## 2014-05-15 LAB — PROTIME-INR
INR: 1 ratio (ref 0.8–1.0)
Prothrombin Time: 11.6 s (ref 9.6–13.1)

## 2014-05-15 LAB — BASIC METABOLIC PANEL
BUN: 17 mg/dL (ref 6–23)
CO2: 32 meq/L (ref 19–32)
Calcium: 9.6 mg/dL (ref 8.4–10.5)
Chloride: 104 mEq/L (ref 96–112)
Creatinine, Ser: 1.4 mg/dL (ref 0.40–1.50)
GFR: 51.76 mL/min — ABNORMAL LOW (ref >60.00)
GLUCOSE: 88 mg/dL (ref 70–99)
POTASSIUM: 4.9 meq/L (ref 3.5–5.1)
SODIUM: 140 meq/L (ref 135–145)

## 2014-05-15 NOTE — Progress Notes (Signed)
HPI Mark Nguyen returns today for followup. He is a pleasant 81 yo man with of an ischemic cardiomyopathy, chronic systolic heart failure, and left bundle branch block, status post ICD implantation. He denies chest pain or worsening heart failure symptoms. He remains active. He has had VT which is well controlled on amiodarone. He denies chest pain or shortness of breath. He has reached ERI. No Known Allergies   Current Outpatient Prescriptions  Medication Sig Dispense Refill  . amiodarone (PACERONE) 200 MG tablet TAKE ONE (1) TABLET EACH DAY 90 tablet 30  . aspirin 81 MG tablet Take 81 mg by mouth 2 (two) times daily.    [name] atorvastatin (LIPITOR) 20 MG tablet TAKE ONE (1) TABLET EACH DAY 90 tablet 3  . carvedilol (COREG) 25 MG tablet Take 1 tablet (25 mg total) by mouth 2 (two) times daily with a meal. 180 tablet 3  . Cholecalciferol (VITAMIN D-3) 1000 UNITS CAPS Take 1 capsule by mouth daily.    [name] omeprazole (PRILOSEC) 20 MG capsule TAKE 1 CAPSULE DAILY. 30 capsule 6  . spironolactone (ALDACTONE) 25 MG tablet Take 1/2 tablet by mouth daily. 45 tablet 3  . Tamsulosin HCl (FLOMAX) 0.4 MG CAPS Take 0.4 mg by mouth daily.     No current facility-administered medications for this visit.     Past Medical History  Diagnosis Date  . Hx-sudden cardiac arrest   . LBBB (left bundle branch block)   . CAD (coronary artery disease)   . Ventricular tachycardia   . History of ventricular fibrillation   . Ischemic cardiomyopathy   . HTN (hypertension)   . Dyslipidemia   . CHF (congestive heart failure)   . HLD (hyperlipidemia)     ROS:   All systems reviewed and negative except as noted in the HPI.   Past Surgical History  Procedure Laterality Date  . Cardiac defibrillator placement      St Jude  . Doppler echocardiography  2008, 2011  . Cardiac catheterization  10/12/2008  . Tonsilectomy, adenoidectomy, bilateral myringotomy and tubes       Family History  Problem Relation Age of  Onset  . Coronary artery disease Other      History   Social History  . Marital Status: Divorced    Spouse Name: N/A  . Number of Children: N/A  . Years of Education: N/A   Occupational History  . furniture truck driver    Social History Main Topics  . Smoking status: Former Smoker    Quit date: 01/03/1985  . Smokeless tobacco: Not on file  . Alcohol Use: Not on file  . Drug Use: Not on file  . Sexual Activity: Not on file   Other Topics Concern  . Not on file   Social History Narrative     BP 130/60 mmHg  Pulse 60  Ht 5\' 5"  (1.651 m)  Wt 200 lb 9.6 oz (90.992 kg)  BMI 33.38 kg/m2  Physical Exam:  Well appearing overweight, 81 year old man, NAD HEENT: Unremarkable Neck:  7 cm JVD, no thyromegally Back:  No CVA tenderness Lungs:  Clear with no wheezes, rales, or rhonchi. HEART:  Regular rate rhythm, no murmurs, no rubs, no clicks Abd:  soft, positive bowel sounds, no organomegally, no rebound, no guarding Ext:  2 plus pulses, no edema, no cyanosis, no clubbing Skin:  No rashes no nodules Neuro:  CN II through XII intact, motor grossly intact   DEVICE  Normal device function.  See PaceArt for details.  Assess/Plan:

## 2014-05-15 NOTE — Patient Instructions (Signed)
Medication Instructions:  Your physician recommends that you continue on your current medications as directed. Please refer to the Current Medication list given to you today.   Labwork: Your physician recommends that you return for lab work TODAY  (PT/INR, CBC, BMP)   Testing/Procedures: NONE  Follow-Up: Your physician recommends that you schedule a follow-up appointment in: 7-10 days from 5/16 in [facility] for a Wound Check   Any Other Special Instructions Will Be Listed Below (If Applicable).

## 2014-05-15 NOTE — Assessment & Plan Note (Signed)
He has had no recurrent VT. He will continue amiodarone.

## 2014-05-15 NOTE — Assessment & Plan Note (Signed)
He is maintaining NSR and will continue his current dose of amiodarone.

## 2014-05-15 NOTE — Assessment & Plan Note (Signed)
His St. Jude ICD is working normally but has reached ERI. He will undergo gen change.

## 2014-05-15 NOTE — Assessment & Plan Note (Signed)
He has chronic class 2 symptoms and is stable. He will continue his current meds and maintain a low sodium diet.

## 2014-05-15 NOTE — Assessment & Plan Note (Signed)
>>  ASSESSMENT AND PLAN FOR SYSTOLIC HEART FAILURE, CHRONIC WRITTEN ON 05/15/2014  8:25 AM BY WADDELL DANELLE ORN, MD  He has chronic class 2 symptoms and is stable. He will continue his current meds and maintain a low sodium diet.

## 2014-05-15 NOTE — Assessment & Plan Note (Signed)
>>  ASSESSMENT AND PLAN FOR AUTOMATIC IMPLANTABLE CARDIOVERTER-DEFIBRILLATOR IN SITU WRITTEN ON 05/15/2014  8:23 AM BY WADDELL DANELLE ORN, MD  His St. Jude ICD is working normally but has reached ERI. He will undergo gen change.

## 2014-05-18 MED ORDER — CEFAZOLIN SODIUM-DEXTROSE 2-3 GM-% IV SOLR: 2.0000 g | INTRAVENOUS | Status: DC

## 2014-05-18 MED ORDER — SODIUM CHLORIDE 0.9 % IR SOLN
80.0000 mg | Status: DC
  Filled 2014-05-18 (×2): qty 2

## 2014-05-19 ENCOUNTER — Ambulatory Visit (HOSPITAL_COMMUNITY)
Admission: RE | Admit: 2014-05-19 | Discharge: 2014-05-19 | Disposition: A | Discharge status: Home / Self Care | Payer: Medicare Other | Source: Ambulatory Visit | Attending: Internal Medicine | Admitting: Internal Medicine

## 2014-05-19 ENCOUNTER — Encounter (HOSPITAL_COMMUNITY): Payer: Self-pay | Admitting: Cardiology

## 2014-05-19 ENCOUNTER — Encounter (HOSPITAL_COMMUNITY)
Admission: RE | Disposition: A | Discharge status: Home / Self Care | Payer: Medicare Other | Source: Ambulatory Visit | Attending: Internal Medicine

## 2014-05-19 DIAGNOSIS — Z87891 Personal history of nicotine dependence: Secondary | ICD-10-CM | POA: Diagnosis not present

## 2014-05-19 DIAGNOSIS — Z7982 Long term (current) use of aspirin: Secondary | ICD-10-CM | POA: Insufficient documentation

## 2014-05-19 DIAGNOSIS — E785 Hyperlipidemia, unspecified: Secondary | ICD-10-CM | POA: Insufficient documentation

## 2014-05-19 DIAGNOSIS — I509 Heart failure, unspecified: Secondary | ICD-10-CM | POA: Insufficient documentation

## 2014-05-19 DIAGNOSIS — Z95810 Presence of automatic (implantable) cardiac defibrillator: Secondary | ICD-10-CM | POA: Diagnosis not present

## 2014-05-19 DIAGNOSIS — I472 Ventricular tachycardia: Secondary | ICD-10-CM | POA: Diagnosis not present

## 2014-05-19 DIAGNOSIS — I10 Essential (primary) hypertension: Secondary | ICD-10-CM | POA: Diagnosis not present

## 2014-05-19 DIAGNOSIS — I2510 Atherosclerotic heart disease of native coronary artery without angina pectoris: Secondary | ICD-10-CM | POA: Insufficient documentation

## 2014-05-19 DIAGNOSIS — I5022 Chronic systolic (congestive) heart failure: Secondary | ICD-10-CM | POA: Diagnosis not present

## 2014-05-19 DIAGNOSIS — I4729 Other ventricular tachycardia: Secondary | ICD-10-CM

## 2014-05-19 DIAGNOSIS — Z79899 Other long term (current) drug therapy: Secondary | ICD-10-CM | POA: Insufficient documentation

## 2014-05-19 DIAGNOSIS — Z4502 Encounter for adjustment and management of automatic implantable cardiac defibrillator: Secondary | ICD-10-CM | POA: Diagnosis not present

## 2014-05-19 HISTORY — PX: EP IMPLANTABLE DEVICE: SHX172B

## 2014-05-19 LAB — SURGICAL PCR SCREEN
MRSA, PCR: NEGATIVE
Staphylococcus aureus: NEGATIVE

## 2014-05-19 SURGERY — ICD/BIV ICD GENERATOR CHANGEOUT

## 2014-05-19 MED ORDER — DEXTROSE 5 % IV SOLN
2.0000 g | INTRAVENOUS | Status: DC | PRN
  Administered 2014-05-19: 2 g via INTRAVENOUS

## 2014-05-19 MED ORDER — ONDANSETRON HCL 4 MG/2ML IJ SOLN: 4.0000 mg | Freq: Four times a day (QID) | INTRAMUSCULAR | Status: DC | PRN

## 2014-05-19 MED ORDER — MIDAZOLAM HCL 5 MG/5ML IJ SOLN
INTRAMUSCULAR | Status: DC | PRN
  Administered 2014-05-19 (×3): 1 mg via INTRAVENOUS

## 2014-05-19 MED ORDER — LIDOCAINE HCL (PF) 1 % IJ SOLN
INTRAMUSCULAR | Status: AC
  Filled 2014-05-19: qty 60

## 2014-05-19 MED ORDER — SODIUM CHLORIDE 0.9 % IV SOLN
INTRAVENOUS | Status: DC
  Administered 2014-05-19: 08:00:00 via INTRAVENOUS

## 2014-05-19 MED ORDER — MUPIROCIN 2 % EX OINT
TOPICAL_OINTMENT | CUTANEOUS | Status: AC
  Administered 2014-05-19: 1 via TOPICAL
  Filled 2014-05-19: qty 22

## 2014-05-19 MED ORDER — MUPIROCIN 2 % EX OINT
1.0000 "application " | TOPICAL_OINTMENT | Freq: Once | CUTANEOUS | Status: AC
  Administered 2014-05-19: 1 via TOPICAL

## 2014-05-19 MED ORDER — CHLORHEXIDINE GLUCONATE 4 % EX LIQD: 60.0000 mL | Freq: Once | CUTANEOUS | Status: DC

## 2014-05-19 MED ORDER — CEFAZOLIN SODIUM-DEXTROSE 2-3 GM-% IV SOLR
INTRAVENOUS | Status: AC
  Filled 2014-05-19: qty 50

## 2014-05-19 MED ORDER — MIDAZOLAM HCL 5 MG/5ML IJ SOLN
INTRAMUSCULAR | Status: AC
  Filled 2014-05-19: qty 5

## 2014-05-19 MED ORDER — ACETAMINOPHEN 325 MG PO TABS
325.0000 mg | ORAL_TABLET | ORAL | Status: DC | PRN
  Filled 2014-05-19: qty 2

## 2014-05-19 MED ORDER — FENTANYL CITRATE (PF) 100 MCG/2ML IJ SOLN
INTRAMUSCULAR | Status: DC | PRN
  Administered 2014-05-19: 25 ug via INTRAVENOUS
  Administered 2014-05-19: 12.5 ug via INTRAVENOUS

## 2014-05-19 MED ORDER — FENTANYL CITRATE (PF) 100 MCG/2ML IJ SOLN
INTRAMUSCULAR | Status: AC
  Filled 2014-05-19: qty 2

## 2014-05-19 SURGICAL SUPPLY — 4 items
CABLE SURGICAL S-101-97-12 (CABLE) ×3 IMPLANT
ICD UNIFY ASURA CRT CD3357-40C (ICD Generator) ×3 IMPLANT
PAD DEFIB LIFELINK (PAD) ×3 IMPLANT
TRAY PACEMAKER INSERTION (CUSTOM PROCEDURE TRAY) ×3 IMPLANT

## 2014-05-19 NOTE — Discharge Instructions (Signed)
° ° °  Supplemental Discharge Instructions for  Defibrillator Patients  Activity No lifting over 20 pounds for 1 week.  NO DRIVING for 3 days  WOUND CARE - Keep the wound area clean and dry.  Do not get this area wet for one week. No showers for one week; you may shower on  05/26/14   . - The tape/steri-strips on your wound will fall off; do not pull them off.  No bandage is needed on the site.  DO  NOT apply any creams, oils, or ointments to the wound area. - If you notice any drainage or discharge from the wound, any swelling or bruising at the site, or you develop a fever > 101? F after you are discharged home, call the office at once.  Special Instructions - You are still able to use cellular telephones; use the ear opposite the side where you have your pacemaker/defibrillator.  Avoid carrying your cellular phone near your device. - When traveling through airports, show security personnel your identification card to avoid being screened in the metal detectors.  Ask the security personnel to use the hand wand. - Avoid arc welding equipment, MRI testing (magnetic resonance imaging), TENS units (transcutaneous nerve stimulators).  Call the office for questions about other devices. - Avoid electrical appliances that are in poor condition or are not properly grounded. - Microwave ovens are safe to be near or to operate.  Additional information for defibrillator patients should your device go off: - If your device goes off ONCE and you feel fine afterward, notify the device clinic nurses. - If your device goes off ONCE and you do not feel well afterward, call 911. - If your device goes off TWICE, call 911. - If your device goes off THREE times in one day, call 911.  DO NOT DRIVE YOURSELF OR A FAMILY MEMBER WITH A DEFIBRILLATOR TO THE HOSPITAL--CALL 911.

## 2014-05-19 NOTE — CV Procedure (Signed)
EP Procedure Note  Procedure: ICD generator removal and insertion of a new ICD with ICD pocket revision  Preoperative diagnosis: VT, chronic systolic heart failure, s/p ICD with current device at [facility]  Postoperative diagnosis: same as preoperative diagnosis  Description of the procedure: After informed consent was obtained, the patient was taken to the diagnostic EP lab in the fasting state. After the usual preparation draping, intravenous Versed and Fentanyl was used for sedation. 30 cc of lidocaine was infiltrated into the left infraclavicular region. A 6 cm incision was carried out. Electrocautery was utilized to dissect down to the ICD pocket. The ICD generator was removed with gentle traction. The leads were evaluated and found to be working satisfactorily. The new St. Jude biventricular ICD, serial number [id number] was connected to the old ICD and pacing leads and placed back in the subcutaneous pocket. Prior to device reinsertion, the subcutaneous pocket was revised to accommodate the larger size device. The pocket was irrigated with antibiotic irrigation. The incision was closed with 2 layers of Vicryl suture. Benzoin and Steri-Strips her pain on the skin at a pressure dressing placed. The patient was returned to the recovery area in satisfactory condition.  Complications: There were no immediate procedural complications  Conclusion: successful removal of a previously implanted biventricular ICD which had reached elective replacement, and insertion of a new biventricular ICD with ICD pocket revision.   Mikle Bosworth.D.

## 2014-05-19 NOTE — Interval H&P Note (Signed)
History and Physical Interval Note:  05/19/2014 9:09 AM  Mark Nguyen  has presented today for surgery, with the diagnosis of eri  The various methods of treatment have been discussed with the patient and family. After consideration of risks, benefits and other options for treatment, the patient has consented to  BiV ICD generator change out as a surgical intervention .  The patient's history has been reviewed, patient examined, no change in status, stable for surgery.  I have reviewed the patient's chart and labs.  Questions were answered to the patient's satisfaction.     Mikle Bosworth.D.

## 2014-05-19 NOTE — Interval H&P Note (Signed)
History and Physical Interval Note:  05/19/2014 7:25 AM  Mark Nguyen  has presented today for surgery, with the diagnosis of eri  The various methods of treatment have been discussed with the patient and family. After consideration of risks, benefits and other options for treatment, the patient has consented to  Biv ICD generator change out as a surgical intervention .  The patient's history has been reviewed, patient examined, no change in status, stable for surgery.  I have reviewed the patient's chart and labs.  Questions were answered to the patient's satisfaction.     Carleene Overlie Taylor,MD

## 2014-05-19 NOTE — H&P (View-Only) (Signed)
HPI Mr. Urbani returns today for followup. He is a pleasant 81 yo man with of an ischemic cardiomyopathy, chronic systolic heart failure, and left bundle branch block, status post ICD implantation. He denies chest pain or worsening heart failure symptoms. He remains active. He has had VT which is well controlled on amiodarone. He denies chest pain or shortness of breath. He has reached ERI. No Known Allergies   Current Outpatient Prescriptions  Medication Sig Dispense Refill  . amiodarone (PACERONE) 200 MG tablet TAKE ONE (1) TABLET EACH DAY 90 tablet 30  . aspirin 81 MG tablet Take 81 mg by mouth 2 (two) times daily.    [name] atorvastatin (LIPITOR) 20 MG tablet TAKE ONE (1) TABLET EACH DAY 90 tablet 3  . carvedilol (COREG) 25 MG tablet Take 1 tablet (25 mg total) by mouth 2 (two) times daily with a meal. 180 tablet 3  . Cholecalciferol (VITAMIN D-3) 1000 UNITS CAPS Take 1 capsule by mouth daily.    [name] omeprazole (PRILOSEC) 20 MG capsule TAKE 1 CAPSULE DAILY. 30 capsule 6  . spironolactone (ALDACTONE) 25 MG tablet Take 1/2 tablet by mouth daily. 45 tablet 3  . Tamsulosin HCl (FLOMAX) 0.4 MG CAPS Take 0.4 mg by mouth daily.     No current facility-administered medications for this visit.     Past Medical History  Diagnosis Date  . Hx-sudden cardiac arrest   . LBBB (left bundle branch block)   . CAD (coronary artery disease)   . Ventricular tachycardia   . History of ventricular fibrillation   . Ischemic cardiomyopathy   . HTN (hypertension)   . Dyslipidemia   . CHF (congestive heart failure)   . HLD (hyperlipidemia)     ROS:   All systems reviewed and negative except as noted in the HPI.   Past Surgical History  Procedure Laterality Date  . Cardiac defibrillator placement      St Jude  . Doppler echocardiography  2008, 2011  . Cardiac catheterization  10/12/2008  . Tonsilectomy, adenoidectomy, bilateral myringotomy and tubes       Family History  Problem Relation Age of  Onset  . Coronary artery disease Other      History   Social History  . Marital Status: Divorced    Spouse Name: N/A  . Number of Children: N/A  . Years of Education: N/A   Occupational History  . furniture truck driver    Social History Main Topics  . Smoking status: Former Smoker    Quit date: 01/03/1985  . Smokeless tobacco: Not on file  . Alcohol Use: Not on file  . Drug Use: Not on file  . Sexual Activity: Not on file   Other Topics Concern  . Not on file   Social History Narrative     BP 130/60 mmHg  Pulse 60  Ht 5\' 5"  (1.651 m)  Wt 200 lb 9.6 oz (90.992 kg)  BMI 33.38 kg/m2  Physical Exam:  Well appearing overweight, 81 year old man, NAD HEENT: Unremarkable Neck:  7 cm JVD, no thyromegally Back:  No CVA tenderness Lungs:  Clear with no wheezes, rales, or rhonchi. HEART:  Regular rate rhythm, no murmurs, no rubs, no clicks Abd:  soft, positive bowel sounds, no organomegally, no rebound, no guarding Ext:  2 plus pulses, no edema, no cyanosis, no clubbing Skin:  No rashes no nodules Neuro:  CN II through XII intact, motor grossly intact   DEVICE  Normal device function.  See PaceArt for details.  Assess/Plan:

## 2014-05-20 MED FILL — Lidocaine HCl Local Preservative Free (PF) Inj 1%: INTRAMUSCULAR | Qty: 30 | Status: AC

## 2014-05-28 ENCOUNTER — Ambulatory Visit: Payer: Medicare Other

## 2014-05-30 ENCOUNTER — Ambulatory Visit (INDEPENDENT_AMBULATORY_CARE_PROVIDER_SITE_OTHER): Payer: Medicare Other | Admitting: *Deleted

## 2014-05-30 ENCOUNTER — Ambulatory Visit (HOSPITAL_COMMUNITY)
Admission: RE | Admit: 2014-05-30 | Discharge: 2014-05-30 | Disposition: A | Discharge status: Home / Self Care | Payer: Medicare Other | Source: Ambulatory Visit | Attending: Internal Medicine | Admitting: Internal Medicine

## 2014-05-30 DIAGNOSIS — Z95810 Presence of automatic (implantable) cardiac defibrillator: Secondary | ICD-10-CM | POA: Diagnosis not present

## 2014-05-30 DIAGNOSIS — I4729 Other ventricular tachycardia: Secondary | ICD-10-CM

## 2014-05-30 DIAGNOSIS — I4901 Ventricular fibrillation: Secondary | ICD-10-CM

## 2014-05-30 DIAGNOSIS — I5022 Chronic systolic (congestive) heart failure: Secondary | ICD-10-CM | POA: Diagnosis not present

## 2014-05-30 DIAGNOSIS — I480 Paroxysmal atrial fibrillation: Secondary | ICD-10-CM

## 2014-05-30 DIAGNOSIS — Z4502 Encounter for adjustment and management of automatic implantable cardiac defibrillator: Secondary | ICD-10-CM | POA: Diagnosis not present

## 2014-05-30 DIAGNOSIS — I472 Ventricular tachycardia: Secondary | ICD-10-CM | POA: Diagnosis not present

## 2014-05-30 LAB — CUP PACEART INCLINIC DEVICE CHECK
Brady Statistic RA Percent Paced: 97 %
Brady Statistic RV Percent Paced: 98 %
Date Time Interrogation Session: 20160527132538
HighPow Impedance: 45.8438
Lead Channel Impedance Value: 387.5 Ohm
Lead Channel Impedance Value: 625 Ohm
Lead Channel Pacing Threshold Amplitude: 0.75 V
Lead Channel Pacing Threshold Amplitude: 0.75 V
Lead Channel Pacing Threshold Amplitude: 1.25 V
Lead Channel Pacing Threshold Amplitude: 2 V
Lead Channel Pacing Threshold Pulse Width: 0.5 ms
Lead Channel Pacing Threshold Pulse Width: 0.5 ms
Lead Channel Pacing Threshold Pulse Width: 1 ms
Lead Channel Pacing Threshold Pulse Width: 1 ms
Lead Channel Pacing Threshold Pulse Width: 1 ms
Lead Channel Pacing Threshold Pulse Width: 1 ms
Lead Channel Sensing Intrinsic Amplitude: 1.2 mV
Lead Channel Sensing Intrinsic Amplitude: 12 mV
Lead Channel Setting Pacing Amplitude: 2.5 V
Lead Channel Setting Pacing Amplitude: 2.5 V
Lead Channel Setting Pacing Pulse Width: 0.5 ms
MDC IDC MSMT BATTERY REMAINING LONGEVITY: 54 mo
MDC IDC MSMT LEADCHNL LV IMPEDANCE VALUE: 800 Ohm
MDC IDC MSMT LEADCHNL LV PACING THRESHOLD AMPLITUDE: 2 V
MDC IDC MSMT LEADCHNL RA PACING THRESHOLD AMPLITUDE: 1.25 V
MDC IDC PG SERIAL: 7247398
MDC IDC SET LEADCHNL LV PACING AMPLITUDE: 3 V
MDC IDC SET LEADCHNL LV PACING PULSEWIDTH: 1 ms
MDC IDC SET LEADCHNL RV SENSING SENSITIVITY: 0.5 mV
MDC IDC SET ZONE DETECTION INTERVAL: 260 ms
Zone Setting Detection Interval: 300 ms
Zone Setting Detection Interval: 375 ms

## 2014-05-30 NOTE — Progress Notes (Signed)
Wound check appointment. Steri-strips removed. Wound without redness or edema. Incision edges approximated, wound well healed. Normal device function. RA/RA thresholds, sensing, and impedances consistent with implant measurements. Increase in LV threshold noted---2.75V @0 .37ms/2.0V @1 .48ms---Output adjusted to appropriate safety margin---CXR ordered. RA pulse width increased to 1.80ms. RV output increased from 2.0V to 2.5V. Histogram distribution appropriate for patient and level of activity. No mode switches or ventricular arrhythmias noted. Patient educated about wound care, arm mobility and shock plan. ROV in 3 months w/GT.

## 2014-07-08 ENCOUNTER — Encounter: Payer: Self-pay | Admitting: Internal Medicine

## 2014-08-13 ENCOUNTER — Other Ambulatory Visit: Payer: Self-pay | Admitting: Internal Medicine

## 2014-08-25 ENCOUNTER — Other Ambulatory Visit: Payer: Self-pay | Admitting: Internal Medicine

## 2014-09-02 ENCOUNTER — Encounter: Payer: Self-pay | Admitting: Internal Medicine

## 2014-09-02 ENCOUNTER — Ambulatory Visit (INDEPENDENT_AMBULATORY_CARE_PROVIDER_SITE_OTHER): Payer: Medicare Other | Admitting: Internal Medicine

## 2014-09-02 VITALS — BP 128/62 | Ht 66.0 in | Wt 198.4 lb

## 2014-09-02 DIAGNOSIS — I480 Paroxysmal atrial fibrillation: Secondary | ICD-10-CM | POA: Diagnosis not present

## 2014-09-02 DIAGNOSIS — Z95810 Presence of automatic (implantable) cardiac defibrillator: Secondary | ICD-10-CM

## 2014-09-02 DIAGNOSIS — I5022 Chronic systolic (congestive) heart failure: Secondary | ICD-10-CM | POA: Diagnosis not present

## 2014-09-02 LAB — CUP PACEART INCLINIC DEVICE CHECK
Brady Statistic RV Percent Paced: 99.32 %
Date Time Interrogation Session: 20160830132725
Lead Channel Impedance Value: 800 Ohm
Lead Channel Pacing Threshold Amplitude: 1 V
Lead Channel Pacing Threshold Amplitude: 1 V
Lead Channel Pacing Threshold Amplitude: 2.125 V
Lead Channel Pacing Threshold Pulse Width: 0.5 ms
Lead Channel Pacing Threshold Pulse Width: 0.5 ms
Lead Channel Pacing Threshold Pulse Width: 1 ms
Lead Channel Pacing Threshold Pulse Width: 1 ms
Lead Channel Pacing Threshold Pulse Width: 1 ms
Lead Channel Sensing Intrinsic Amplitude: 0.7 mV
Lead Channel Setting Pacing Amplitude: 2 V
Lead Channel Setting Pacing Amplitude: 2.5 V
Lead Channel Setting Pacing Amplitude: 3.125
Lead Channel Setting Pacing Pulse Width: 0.5 ms
Lead Channel Setting Pacing Pulse Width: 1 ms
MDC IDC MSMT BATTERY REMAINING LONGEVITY: 52.8 mo
MDC IDC MSMT LEADCHNL LV PACING THRESHOLD AMPLITUDE: 2.25 V
MDC IDC MSMT LEADCHNL LV PACING THRESHOLD PULSEWIDTH: 1 ms
MDC IDC MSMT LEADCHNL RA IMPEDANCE VALUE: 412.5 Ohm
MDC IDC MSMT LEADCHNL RV IMPEDANCE VALUE: 612.5 Ohm
MDC IDC MSMT LEADCHNL RV PACING THRESHOLD AMPLITUDE: 0.75 V
MDC IDC MSMT LEADCHNL RV PACING THRESHOLD AMPLITUDE: 0.75 V
MDC IDC MSMT LEADCHNL RV SENSING INTR AMPL: 12 mV
MDC IDC SET LEADCHNL RV SENSING SENSITIVITY: 0.5 mV
MDC IDC SET ZONE DETECTION INTERVAL: 300 ms
MDC IDC SET ZONE DETECTION INTERVAL: 375 ms
MDC IDC STAT BRADY RA PERCENT PACED: 99.12 %
Pulse Gen Serial Number: 7247398
Zone Setting Detection Interval: 260 ms

## 2014-09-02 MED ORDER — AMIODARONE HCL 200 MG PO TABS: 200.0000 mg | ORAL_TABLET | Freq: Every day | ORAL | Status: DC

## 2014-09-02 MED ORDER — CARVEDILOL 25 MG PO TABS: 25.0000 mg | ORAL_TABLET | Freq: Two times a day (BID) | ORAL | Status: DC

## 2014-09-02 MED ORDER — ATORVASTATIN CALCIUM 20 MG PO TABS: 20.0000 mg | ORAL_TABLET | Freq: Every day | ORAL | Status: DC

## 2014-09-02 MED ORDER — SPIRONOLACTONE 25 MG PO TABS: 12.5000 mg | ORAL_TABLET | Freq: Every day | ORAL | Status: DC

## 2014-09-02 MED ORDER — OMEPRAZOLE 20 MG PO CPDR: 20.0000 mg | DELAYED_RELEASE_CAPSULE | Freq: Every day | ORAL | Status: DC

## 2014-09-02 NOTE — Addendum Note (Signed)
Addended by: Janan Halter F on: 09/02/2014 11:48 AM   Modules accepted: Orders

## 2014-09-02 NOTE — Assessment & Plan Note (Signed)
>>  ASSESSMENT AND PLAN FOR SYSTOLIC HEART FAILURE, CHRONIC WRITTEN ON 09/02/2014  9:54 AM BY WADDELL DANELLE ORN, MD  His symptoms remain class 2. He will continue his current meds.

## 2014-09-02 NOTE — Patient Instructions (Signed)
Medication Instructions:  Your physician recommends that you continue on your current medications as directed. Please refer to the Current Medication list given to you today.   Labwork: None ordered  Testing/Procedures: None ordered  Follow-Up: Your physician wants you to follow-up in: May 2017 with Dr Knox Saliva will receive a reminder letter in the mail two months in advance. If you don't receive a letter, please call our office to schedule the follow-up appointment.  Remote monitoring is used to monitor your  ICD from home. This monitoring reduces the number of office visits required to check your device to one time per year. It allows [name] to keep an eye on the functioning of your device to ensure it is working properly. You are scheduled for a device check from home on 12/02/14. You may send your transmission at any time that day. If you have a wireless device, the transmission will be sent automatically. After your physician reviews your transmission, you will receive a postcard with your next transmission date.    Any Other Special Instructions Will Be Listed Below (If Applicable).

## 2014-09-02 NOTE — Assessment & Plan Note (Signed)
His symptoms remain class 2. He will continue his current meds. 

## 2014-09-02 NOTE — Assessment & Plan Note (Signed)
He is maintaining NSR on amiodarone. Will follow. 

## 2014-09-02 NOTE — Assessment & Plan Note (Signed)
His St. Jude Biv ICD is working normally. Will recheck in several months.

## 2014-09-02 NOTE — Assessment & Plan Note (Signed)
>>  ASSESSMENT AND PLAN FOR AUTOMATIC IMPLANTABLE CARDIOVERTER-DEFIBRILLATOR IN SITU WRITTEN ON 09/02/2014  9:55 AM BY WADDELL DANELLE ORN, MD  His St. Jude Biv ICD is working normally. Will recheck in several months.

## 2014-09-02 NOTE — Progress Notes (Signed)
HPI [name] returns today for followup. He is a pleasant 81 yo man with of an ischemic cardiomyopathy, chronic systolic heart failure, and left bundle branch block, status post ICD implantation. He denies chest pain or worsening heart failure symptoms. He remains active. He has had VT which is well controlled on amiodarone. He denies chest pain or shortness of breath. He has undergone generator replacement.  No Known Allergies   Current Outpatient Prescriptions  Medication Sig Dispense Refill  . amiodarone (PACERONE) 200 MG tablet Take 1 tablet (200 mg total) by mouth daily. 90 tablet 1  . aspirin EC 81 MG tablet Take 162 mg by mouth daily.    [name] atorvastatin (LIPITOR) 20 MG tablet TAKE ONE (1) TABLET EACH DAY (Patient taking differently: Take 20 mg by mouth at bedtime. ) 90 tablet 3  . carvedilol (COREG) 25 MG tablet Take 1 tablet (25 mg total) by mouth 2 (two) times daily with a meal. 180 tablet 3  . Cholecalciferol (VITAMIN D-3) 1000 UNITS CAPS Take 1,000 Units by mouth daily.     . indomethacin (INDOCIN SR) 75 MG CR capsule Take 75 mg by mouth daily as needed (gout attacks).    [name] omeprazole (PRILOSEC) 20 MG capsule TAKE 1 CAPSULE DAILY. 30 capsule 6  . spironolactone (ALDACTONE) 25 MG tablet TAKE 1/2 TABLET DAILY. 45 tablet 0  . Tamsulosin HCl (FLOMAX) 0.4 MG CAPS Take 0.4 mg by mouth daily after breakfast.      No current facility-administered medications for this visit.     Past Medical History  Diagnosis Date  . Hx-sudden cardiac arrest   . LBBB (left bundle branch block)   . CAD (coronary artery disease)   . Ventricular tachycardia   . History of ventricular fibrillation   . Ischemic cardiomyopathy   . HTN (hypertension)   . Dyslipidemia   . CHF (congestive heart failure)     ROS:   All systems reviewed and negative except as noted in the HPI.   Past Surgical History  Procedure Laterality Date  . Cardiac defibrillator placement      St Jude  . Doppler  echocardiography  2008, 2011  . Cardiac catheterization  10/12/2008  . Tonsilectomy, adenoidectomy, bilateral myringotomy and tubes    . Ep implantable device N/A 05/19/2014    Procedure: ICD/BIV ICD Generator Changeout;  Surgeon: Evans Lance, MD;  Location: [facility];  Service: Cardiovascular;  Laterality: N/A;     Family History  Problem Relation Age of Onset  . Coronary artery disease Other      Social History   Social History  . Marital Status: Divorced    Spouse Name: N/A  . Number of Children: N/A  . Years of Education: N/A   Occupational History  . furniture truck driver    Social History Main Topics  . Smoking status: Former Smoker    Quit date: 01/03/1985  . Smokeless tobacco: Not on file  . Alcohol Use: Not on file  . Drug Use: Not on file  . Sexual Activity: Not on file   Other Topics Concern  . Not on file   Social History Narrative     BP 128/62 mmHg  Ht 5\' 6"  (1.676 m)  Wt 198 lb 6.4 oz (89.994 kg)  BMI 32.04 kg/m2  Physical Exam:  Well appearing overweight, 81 year old man, NAD HEENT: Unremarkable Neck:  7 cm JVD, no thyromegally Back:  No CVA tenderness Lungs:  Clear with no wheezes, rales, or  rhonchi. HEART:  Regular rate rhythm, no murmurs, no rubs, no clicks Abd:  soft, positive bowel sounds, no organomegally, no rebound, no guarding Ext:  2 plus pulses, no edema, no cyanosis, no clubbing Skin:  No rashes no nodules Neuro:  CN II through XII intact, motor grossly intact   DEVICE  Normal device function.  See PaceArt for details.   Assess/Plan:

## 2014-09-12 DIAGNOSIS — N3090 Cystitis, unspecified without hematuria: Secondary | ICD-10-CM | POA: Diagnosis not present

## 2014-09-12 DIAGNOSIS — C61 Malignant neoplasm of prostate: Secondary | ICD-10-CM | POA: Diagnosis not present

## 2014-09-12 DIAGNOSIS — R351 Nocturia: Secondary | ICD-10-CM | POA: Diagnosis not present

## 2014-10-01 DIAGNOSIS — Z23 Encounter for immunization: Secondary | ICD-10-CM | POA: Diagnosis not present

## 2014-10-13 ENCOUNTER — Encounter: Payer: Self-pay | Admitting: Internal Medicine

## 2014-12-02 ENCOUNTER — Ambulatory Visit (INDEPENDENT_AMBULATORY_CARE_PROVIDER_SITE_OTHER): Payer: Medicare Other | Admitting: *Deleted

## 2014-12-02 DIAGNOSIS — I5022 Chronic systolic (congestive) heart failure: Secondary | ICD-10-CM | POA: Diagnosis not present

## 2014-12-02 DIAGNOSIS — I4901 Ventricular fibrillation: Secondary | ICD-10-CM | POA: Diagnosis not present

## 2014-12-04 NOTE — Progress Notes (Signed)
Remote ICD transmission.   

## 2014-12-15 LAB — CUP PACEART REMOTE DEVICE CHECK
Date Time Interrogation Session: 20161212102319
Implantable Lead Implant Date: 20030319
Implantable Lead Implant Date: 20110211
Implantable Lead Location: 753860
Implantable Lead Model: 1158
Implantable Lead Model: 4087
Implantable Lead Serial Number: 159867
Lead Channel Setting Pacing Amplitude: 2 V
Lead Channel Setting Pacing Amplitude: 3.125
Lead Channel Setting Pacing Pulse Width: 1 ms
MDC IDC LEAD IMPLANT DT: 20030319
MDC IDC LEAD LOCATION: 753858
MDC IDC LEAD LOCATION: 753859
MDC IDC LEAD MODEL: 158
MDC IDC LEAD SERIAL: 113412
MDC IDC SET LEADCHNL RV PACING AMPLITUDE: 2.5 V
MDC IDC SET LEADCHNL RV PACING PULSEWIDTH: 0.5 ms
MDC IDC SET LEADCHNL RV SENSING SENSITIVITY: 0.5 mV
Pulse Gen Serial Number: 7247398

## 2014-12-16 ENCOUNTER — Encounter: Payer: Self-pay | Admitting: Cardiology

## 2015-01-12 DIAGNOSIS — N401 Enlarged prostate with lower urinary tract symptoms: Secondary | ICD-10-CM | POA: Diagnosis not present

## 2015-01-12 DIAGNOSIS — N3020 Other chronic cystitis without hematuria: Secondary | ICD-10-CM | POA: Diagnosis not present

## 2015-01-12 DIAGNOSIS — R351 Nocturia: Secondary | ICD-10-CM | POA: Diagnosis not present

## 2015-01-12 DIAGNOSIS — C61 Malignant neoplasm of prostate: Secondary | ICD-10-CM | POA: Diagnosis not present

## 2015-02-09 ENCOUNTER — Other Ambulatory Visit: Payer: Self-pay | Admitting: Internal Medicine

## 2015-03-03 ENCOUNTER — Ambulatory Visit (INDEPENDENT_AMBULATORY_CARE_PROVIDER_SITE_OTHER): Payer: Medicare Other | Admitting: *Deleted

## 2015-03-03 DIAGNOSIS — I4901 Ventricular fibrillation: Secondary | ICD-10-CM | POA: Diagnosis not present

## 2015-03-03 DIAGNOSIS — Z95810 Presence of automatic (implantable) cardiac defibrillator: Secondary | ICD-10-CM | POA: Diagnosis not present

## 2015-03-03 DIAGNOSIS — I5022 Chronic systolic (congestive) heart failure: Secondary | ICD-10-CM | POA: Diagnosis not present

## 2015-03-03 NOTE — Progress Notes (Signed)
Remote ICD transmission.   

## 2015-04-06 ENCOUNTER — Encounter: Payer: Self-pay | Admitting: Cardiology

## 2015-04-06 LAB — CUP PACEART REMOTE DEVICE CHECK
Brady Statistic AP VP Percent: 99 %
Brady Statistic AP VS Percent: 1 %
Brady Statistic AS VP Percent: 1 %
Brady Statistic AS VS Percent: 1 %
Date Time Interrogation Session: 20170228070016
HIGH POWER IMPEDANCE MEASURED VALUE: 48 Ohm
HighPow Impedance: 48 Ohm
Implantable Lead Implant Date: 20030319
Implantable Lead Implant Date: 20110211
Implantable Lead Location: 753858
Implantable Lead Model: 158
Lead Channel Pacing Threshold Amplitude: 1 V
Lead Channel Pacing Threshold Pulse Width: 0.5 ms
Lead Channel Pacing Threshold Pulse Width: 1 ms
Lead Channel Pacing Threshold Pulse Width: 1 ms
Lead Channel Sensing Intrinsic Amplitude: 0.6 mV
Lead Channel Setting Pacing Amplitude: 2 V
Lead Channel Setting Pacing Amplitude: 2.5 V
Lead Channel Setting Pacing Amplitude: 3.75 V
Lead Channel Setting Pacing Pulse Width: 1 ms
MDC IDC LEAD IMPLANT DT: 20030319
MDC IDC LEAD LOCATION: 753859
MDC IDC LEAD LOCATION: 753860
MDC IDC LEAD MODEL: 1158
MDC IDC LEAD MODEL: 4087
MDC IDC LEAD SERIAL: 113412
MDC IDC LEAD SERIAL: 159867
MDC IDC MSMT BATTERY REMAINING LONGEVITY: 52 mo
MDC IDC MSMT BATTERY REMAINING PERCENTAGE: 84 %
MDC IDC MSMT BATTERY VOLTAGE: 3.02 V
MDC IDC MSMT LEADCHNL LV IMPEDANCE VALUE: 960 Ohm
MDC IDC MSMT LEADCHNL LV PACING THRESHOLD AMPLITUDE: 2.75 V
MDC IDC MSMT LEADCHNL RA IMPEDANCE VALUE: 390 Ohm
MDC IDC MSMT LEADCHNL RV IMPEDANCE VALUE: 530 Ohm
MDC IDC MSMT LEADCHNL RV PACING THRESHOLD AMPLITUDE: 0.75 V
MDC IDC MSMT LEADCHNL RV SENSING INTR AMPL: 12 mV
MDC IDC PG SERIAL: 7247398
MDC IDC SET LEADCHNL RV PACING PULSEWIDTH: 0.5 ms
MDC IDC SET LEADCHNL RV SENSING SENSITIVITY: 0.5 mV
MDC IDC STAT BRADY RA PERCENT PACED: 99 %

## 2015-05-04 ENCOUNTER — Other Ambulatory Visit: Payer: Self-pay | Admitting: Internal Medicine

## 2015-05-12 DIAGNOSIS — C61 Malignant neoplasm of prostate: Secondary | ICD-10-CM | POA: Diagnosis not present

## 2015-05-12 DIAGNOSIS — N3020 Other chronic cystitis without hematuria: Secondary | ICD-10-CM | POA: Diagnosis not present

## 2015-05-12 DIAGNOSIS — R351 Nocturia: Secondary | ICD-10-CM | POA: Diagnosis not present

## 2015-05-12 DIAGNOSIS — N401 Enlarged prostate with lower urinary tract symptoms: Secondary | ICD-10-CM | POA: Diagnosis not present

## 2015-06-09 ENCOUNTER — Encounter: Payer: Self-pay | Admitting: Internal Medicine

## 2015-06-09 ENCOUNTER — Ambulatory Visit (INDEPENDENT_AMBULATORY_CARE_PROVIDER_SITE_OTHER): Payer: Medicare Other | Admitting: Internal Medicine

## 2015-06-09 VITALS — BP 102/60 | HR 60 | Ht 67.0 in | Wt 197.8 lb

## 2015-06-09 DIAGNOSIS — Z95810 Presence of automatic (implantable) cardiac defibrillator: Secondary | ICD-10-CM

## 2015-06-09 DIAGNOSIS — I5022 Chronic systolic (congestive) heart failure: Secondary | ICD-10-CM

## 2015-06-09 DIAGNOSIS — I480 Paroxysmal atrial fibrillation: Secondary | ICD-10-CM

## 2015-06-09 LAB — CUP PACEART INCLINIC DEVICE CHECK
Battery Remaining Longevity: 45.6
Brady Statistic RA Percent Paced: 99 %
HIGH POWER IMPEDANCE MEASURED VALUE: 48.1222
HIGH POWER IMPEDANCE MEASURED VALUE: 48.1222
Implantable Lead Implant Date: 20030319
Implantable Lead Location: 753858
Implantable Lead Location: 753859
Implantable Lead Location: 753860
Implantable Lead Model: 1158
Implantable Lead Serial Number: 113412
Implantable Lead Serial Number: 159867
Lead Channel Impedance Value: 387.5 Ohm
Lead Channel Impedance Value: 550 Ohm
Lead Channel Impedance Value: 550 Ohm
Lead Channel Impedance Value: 837.5 Ohm
Lead Channel Pacing Threshold Amplitude: 0.75 V
Lead Channel Pacing Threshold Amplitude: 2.25 V
Lead Channel Pacing Threshold Amplitude: 2.25 V
Lead Channel Pacing Threshold Pulse Width: 0.5 ms
Lead Channel Pacing Threshold Pulse Width: 1 ms
Lead Channel Pacing Threshold Pulse Width: 1 ms
Lead Channel Pacing Threshold Pulse Width: 1 ms
Lead Channel Sensing Intrinsic Amplitude: 12 mV
Lead Channel Setting Pacing Amplitude: 2 V
Lead Channel Setting Pacing Amplitude: 2.5 V
Lead Channel Setting Pacing Pulse Width: 0.5 ms
Lead Channel Setting Pacing Pulse Width: 1 ms
Lead Channel Setting Sensing Sensitivity: 0.5 mV
MDC IDC LEAD IMPLANT DT: 20030319
MDC IDC LEAD IMPLANT DT: 20110211
MDC IDC LEAD MODEL: 158
MDC IDC LEAD MODEL: 4087
MDC IDC MSMT LEADCHNL LV IMPEDANCE VALUE: 837.5 Ohm
MDC IDC MSMT LEADCHNL RA IMPEDANCE VALUE: 387.5 Ohm
MDC IDC MSMT LEADCHNL RA PACING THRESHOLD AMPLITUDE: 1 V
MDC IDC PG SERIAL: 7247398
MDC IDC SESS DTM: 20170606084439
MDC IDC SET LEADCHNL LV PACING AMPLITUDE: 3.25 V
MDC IDC STAT BRADY RV PERCENT PACED: 99 %

## 2015-06-09 MED ORDER — CARVEDILOL 25 MG PO TABS: 25.0000 mg | ORAL_TABLET | Freq: Two times a day (BID) | ORAL | Status: DC

## 2015-06-09 MED ORDER — OMEPRAZOLE 20 MG PO CPDR: 20.0000 mg | DELAYED_RELEASE_CAPSULE | Freq: Every day | ORAL | Status: DC

## 2015-06-09 MED ORDER — SPIRONOLACTONE 25 MG PO TABS: 12.5000 mg | ORAL_TABLET | Freq: Every day | ORAL | Status: DC

## 2015-06-09 MED ORDER — AMIODARONE HCL 200 MG PO TABS: 200.0000 mg | ORAL_TABLET | Freq: Every day | ORAL | Status: DC

## 2015-06-09 MED ORDER — ATORVASTATIN CALCIUM 20 MG PO TABS: 20.0000 mg | ORAL_TABLET | Freq: Every day | ORAL | Status: DC

## 2015-06-09 NOTE — Progress Notes (Signed)
HPI Mark Nguyen returns today for followup. He is a pleasant 82 yo man with of an ischemic cardiomyopathy, chronic systolic heart failure, and left bundle branch block, status post ICD implantation. He denies chest pain or worsening heart failure symptoms. He remains active. He has had VT which is well controlled on amiodarone. He denies chest pain or shortness of breath. He has undergone generator replacement. He has no complaints today.  No Known Allergies   Current Outpatient Prescriptions  Medication Sig Dispense Refill  . amiodarone (PACERONE) 200 MG tablet Take 1 tablet (200 mg total) by mouth daily. 90 tablet 1  . aspirin EC 81 MG tablet Take 162 mg by mouth daily.    [name] atorvastatin (LIPITOR) 20 MG tablet Take 1 tablet (20 mg total) by mouth at bedtime. 90 tablet 3  . carvedilol (COREG) 25 MG tablet Take 1 tablet (25 mg total) by mouth 2 (two) times daily with a meal. 180 tablet 3  . Cholecalciferol (VITAMIN D-3) 1000 UNITS CAPS Take 1,000 Units by mouth daily.     . indomethacin (INDOCIN SR) 75 MG CR capsule Take 75 mg by mouth daily as needed (gout attacks).    [name] omeprazole (PRILOSEC) 20 MG capsule Take 1 capsule (20 mg total) by mouth daily. 30 capsule 3  . spironolactone (ALDACTONE) 25 MG tablet Take 0.5 tablets (12.5 mg total) by mouth daily. 45 tablet 3  . Tamsulosin HCl (FLOMAX) 0.4 MG CAPS Take 0.4 mg by mouth daily after breakfast.      No current facility-administered medications for this visit.     Past Medical History  Diagnosis Date  . Hx-sudden cardiac arrest   . LBBB (left bundle branch block)   . CAD (coronary artery disease)   . Ventricular tachycardia ([place])   . History of ventricular fibrillation   . Ischemic cardiomyopathy   . HTN (hypertension)   . Dyslipidemia   . CHF (congestive heart failure) ([facility])     ROS:   All systems reviewed and negative except as noted in the HPI.   Past Surgical History  Procedure Laterality Date  . Cardiac defibrillator  placement      St Jude  . Doppler echocardiography  2008, 2011  . Cardiac catheterization  10/12/2008  . Tonsilectomy, adenoidectomy, bilateral myringotomy and tubes    . Ep implantable device N/A 05/19/2014    Procedure: ICD/BIV ICD Generator Changeout;  Surgeon: Evans Lance, MD;  Location: [facility];  Service: Cardiovascular;  Laterality: N/A;     Family History  Problem Relation Age of Onset  . Coronary artery disease Other      Social History   Social History  . Marital Status: Divorced    Spouse Name: N/A  . Number of Children: N/A  . Years of Education: N/A   Occupational History  . furniture truck driver    Social History Main Topics  . Smoking status: Former Smoker    Quit date: 01/03/1985  . Smokeless tobacco: Not on file  . Alcohol Use: Not on file  . Drug Use: Not on file  . Sexual Activity: Not on file   Other Topics Concern  . Not on file   Social History Narrative     BP 102/60 mmHg  Pulse 60  Ht 5\' 7"  (1.702 m)  Wt 197 lb 12.8 oz (89.721 kg)  BMI 30.97 kg/m2  Physical Exam:  Well appearing overweight, 82 year old man, NAD HEENT: Unremarkable Neck:  7 cm JVD, no thyromegally  Back:  No CVA tenderness Lungs:  Clear with no wheezes, rales, or rhonchi. HEART:  Regular rate rhythm, no murmurs, no rubs, no clicks Abd:  soft, positive bowel sounds, no organomegally, no rebound, no guarding Ext:  2 plus pulses, no edema, no cyanosis, no clubbing Skin:  No rashes no nodules Neuro:  CN II through XII intact, motor grossly intact   DEVICE  Normal device function.  See PaceArt for details.   Assess/Plan: 1. Chronic systolic heart failure - his symptoms are class 2. He will continue his current meds. 2. ICD - His St. Jude BiV ICD is working normally.  3. VT - he has had no recurrent ventricular arrhythmias.  Mark Nguyen.D.

## 2015-06-09 NOTE — Patient Instructions (Signed)
Medication Instructions:  Your physician recommends that you continue on your current medications as directed. Please refer to the Current Medication list given to you today.   Labwork: None ordered   Testing/Procedures: None ordered   Follow-Up: Your physician wants you to follow-up in: 12 months with Dr Taylor You will receive a reminder letter in the mail two months in advance. If you don't receive a letter, please call our office to schedule the follow-up appointment.  Remote monitoring is used to monitor your  ICD from home. This monitoring reduces the number of office visits required to check your device to one time per year. It allows us to keep an eye on the functioning of your device to ensure it is working properly. You are scheduled for a device check from home on 09/09/15. You may send your transmission at any time that day. If you have a wireless device, the transmission will be sent automatically. After your physician reviews your transmission, you will receive a postcard with your next transmission date.     Any Other Special Instructions Will Be Listed Below (If Applicable).     If you need a refill on your cardiac medications before your next appointment, please call your pharmacy.   

## 2015-08-31 IMAGING — DX DG CHEST 2V
2 series · 2 of 2 positions shown · non-contrast
Comparison: PA and lateral chest x-ray February 14, 2009

CLINICAL DATA: One week status post AICD placement, asymptomatic

EXAM:
CHEST  2 VIEW

[chest pa]
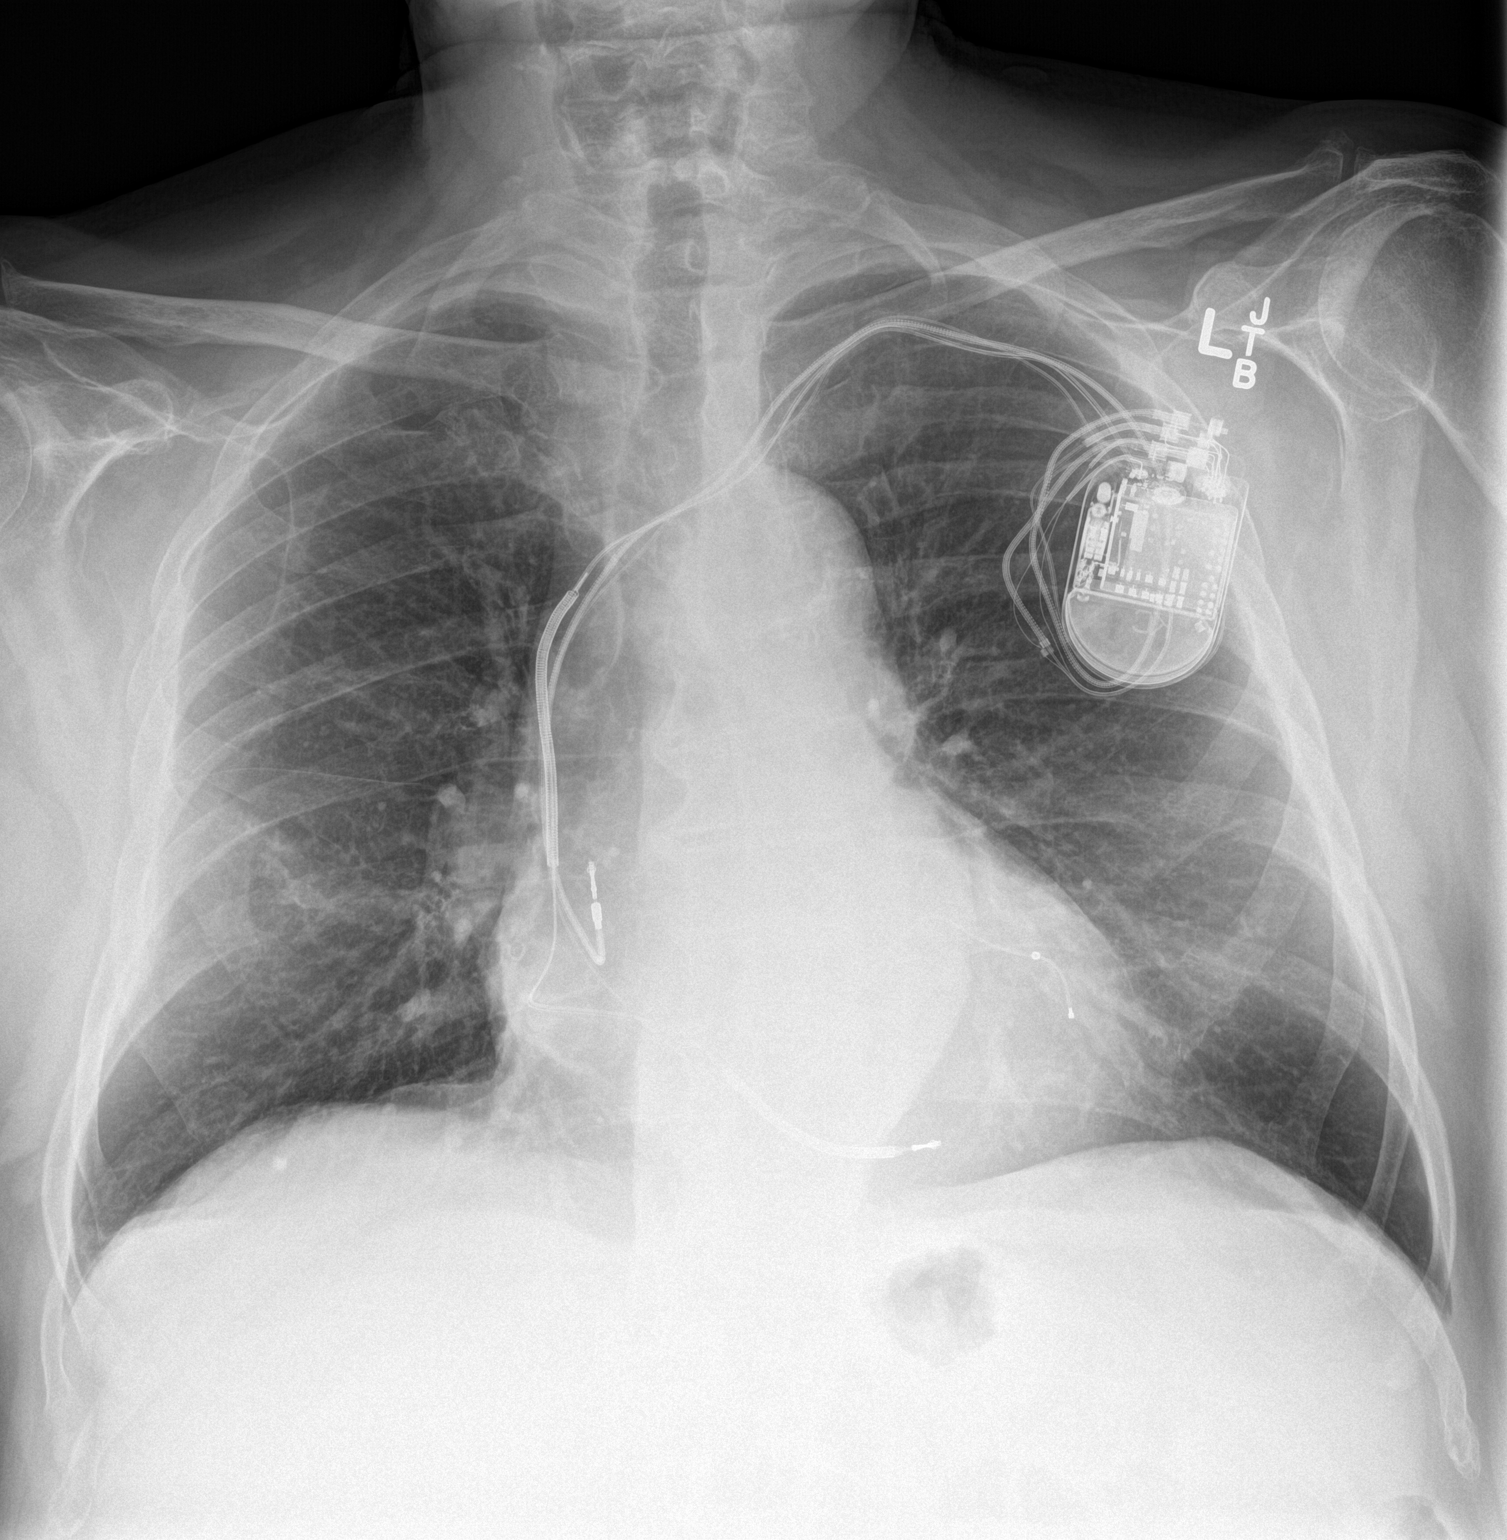

[chest lat]
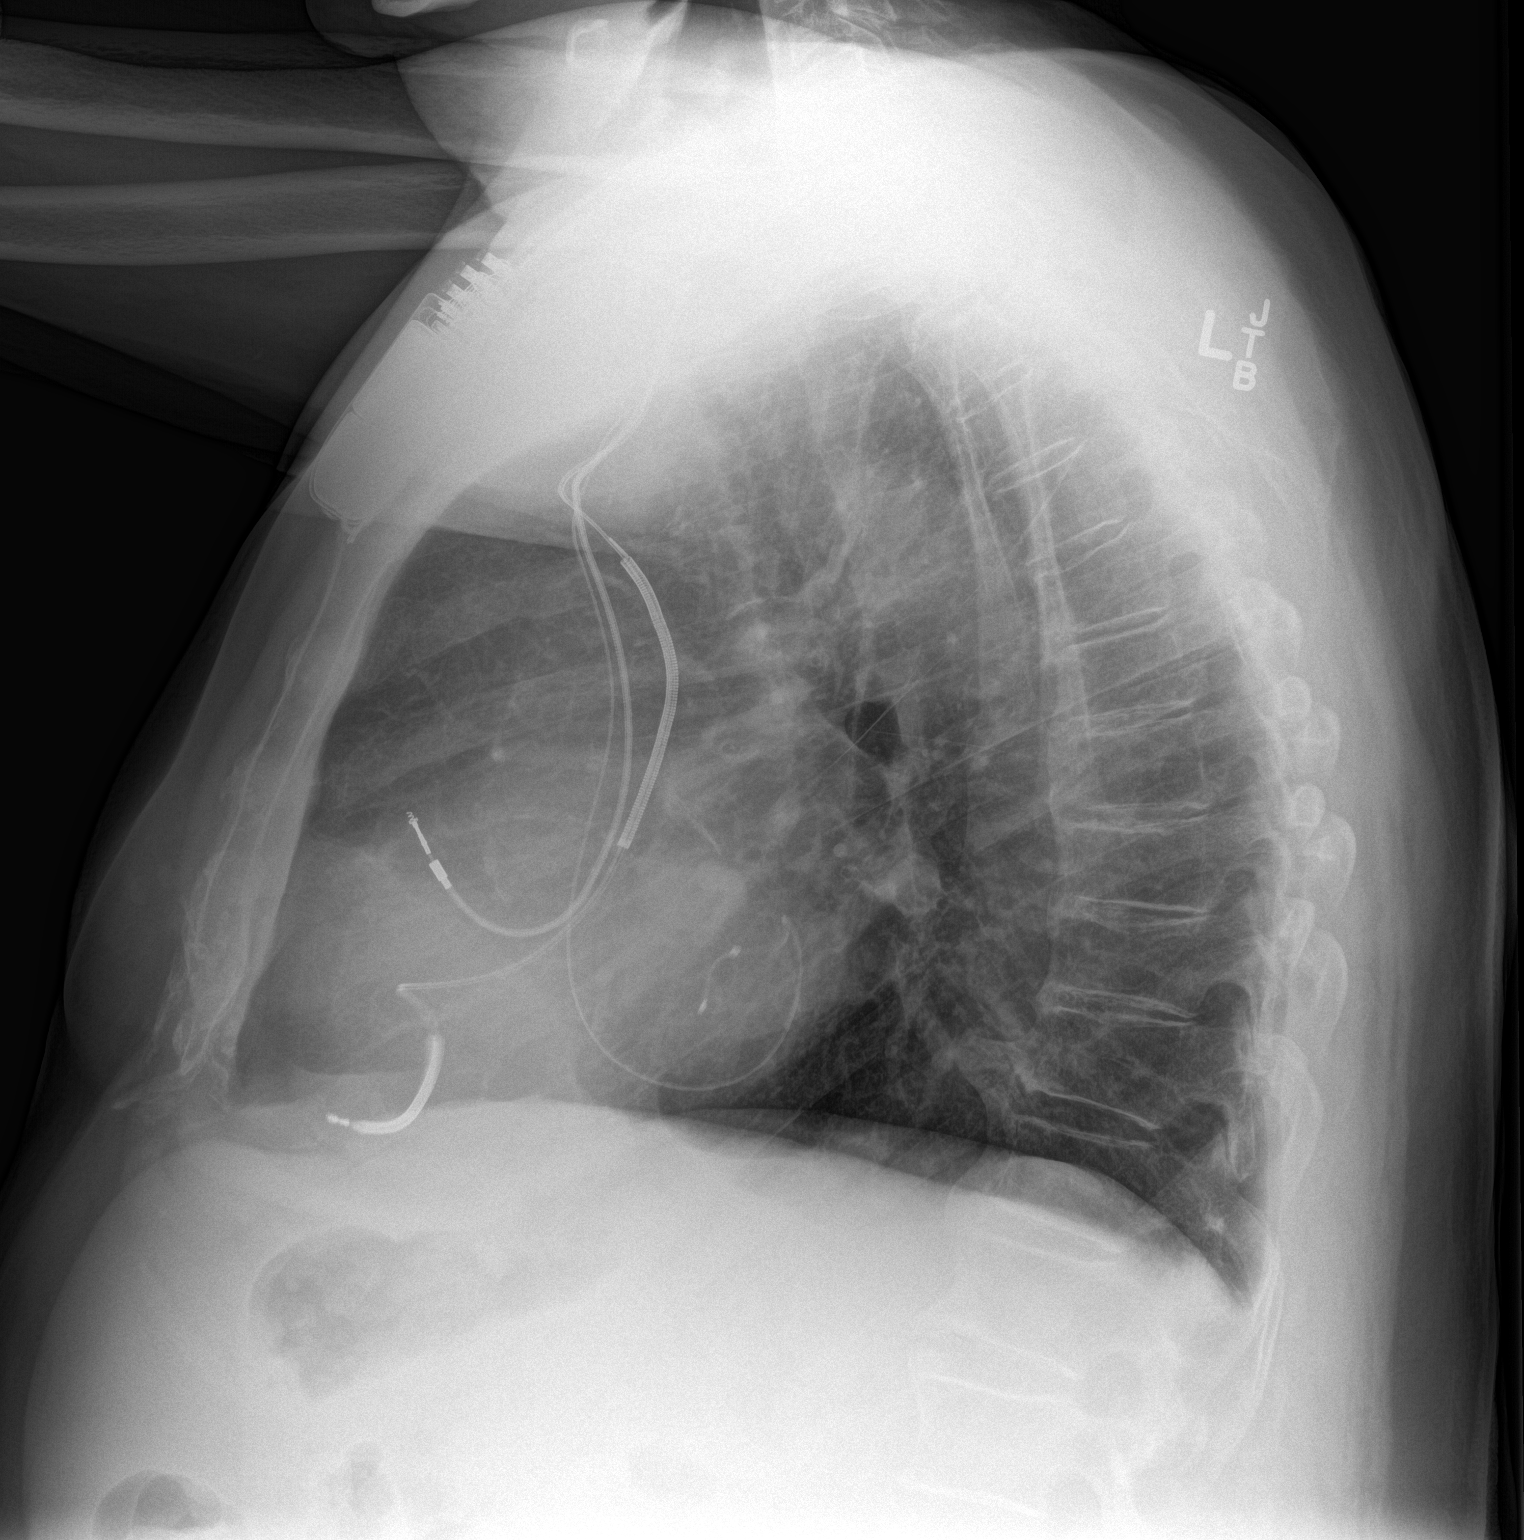

[2 of 2 positions shown; findings below may reference images not displayed]

FINDINGS: The lungs are adequately inflated. There is subtle increased density
in the left mid lung which is not new. The heart is top-normal in
size but stable. The pulmonary vascularity is normal. The AICD is in
reasonable position radiographically. There is tortuosity of the
descending thoracic aorta. There is no pleural effusion. The bony
thorax is unremarkable.
IMPRESSION: There is no complication following AICD placement. Minimal
prominence of the interstitial markings in the right mid lung likely
reflects scarring.

## 2015-09-08 ENCOUNTER — Ambulatory Visit (INDEPENDENT_AMBULATORY_CARE_PROVIDER_SITE_OTHER): Payer: Medicare Other | Admitting: *Deleted

## 2015-09-08 DIAGNOSIS — I4901 Ventricular fibrillation: Secondary | ICD-10-CM

## 2015-09-08 DIAGNOSIS — Z95810 Presence of automatic (implantable) cardiac defibrillator: Secondary | ICD-10-CM

## 2015-09-08 NOTE — Progress Notes (Signed)
Remote ICD transmission.   

## 2015-09-11 ENCOUNTER — Encounter: Payer: Self-pay | Admitting: Cardiology

## 2015-09-14 DIAGNOSIS — N3020 Other chronic cystitis without hematuria: Secondary | ICD-10-CM | POA: Diagnosis not present

## 2015-09-14 DIAGNOSIS — C61 Malignant neoplasm of prostate: Secondary | ICD-10-CM | POA: Diagnosis not present

## 2015-09-16 DIAGNOSIS — M1A9XX1 Chronic gout, unspecified, with tophus (tophi): Secondary | ICD-10-CM | POA: Diagnosis not present

## 2015-09-16 DIAGNOSIS — Z23 Encounter for immunization: Secondary | ICD-10-CM | POA: Diagnosis not present

## 2015-09-16 DIAGNOSIS — Z6831 Body mass index (BMI) 31.0-31.9, adult: Secondary | ICD-10-CM | POA: Diagnosis not present

## 2015-09-16 DIAGNOSIS — I25118 Atherosclerotic heart disease of native coronary artery with other forms of angina pectoris: Secondary | ICD-10-CM | POA: Diagnosis not present

## 2015-09-16 DIAGNOSIS — E785 Hyperlipidemia, unspecified: Secondary | ICD-10-CM | POA: Diagnosis not present

## 2015-09-16 DIAGNOSIS — I10 Essential (primary) hypertension: Secondary | ICD-10-CM | POA: Diagnosis not present

## 2015-09-16 LAB — CUP PACEART REMOTE DEVICE CHECK
Battery Remaining Longevity: 46 mo
Brady Statistic AS VS Percent: 1 %
Date Time Interrogation Session: 20170905060016
HIGH POWER IMPEDANCE MEASURED VALUE: 45 Ohm
HIGH POWER IMPEDANCE MEASURED VALUE: 45 Ohm
Implantable Lead Implant Date: 20030319
Implantable Lead Implant Date: 20110211
Implantable Lead Location: 753858
Implantable Lead Location: 753860
Implantable Lead Serial Number: 159867
Lead Channel Pacing Threshold Amplitude: 1 V
Lead Channel Pacing Threshold Amplitude: 2.75 V
Lead Channel Sensing Intrinsic Amplitude: 3 mV
Lead Channel Setting Pacing Amplitude: 2.5 V
Lead Channel Setting Pacing Pulse Width: 1 ms
Lead Channel Setting Sensing Sensitivity: 0.5 mV
MDC IDC LEAD IMPLANT DT: 20030319
MDC IDC LEAD LOCATION: 753859
MDC IDC LEAD MODEL: 158
MDC IDC LEAD MODEL: 4087
MDC IDC LEAD SERIAL: 113412
MDC IDC MSMT BATTERY REMAINING PERCENTAGE: 76 %
MDC IDC MSMT BATTERY VOLTAGE: 2.99 V
MDC IDC MSMT LEADCHNL LV IMPEDANCE VALUE: 790 Ohm
MDC IDC MSMT LEADCHNL LV PACING THRESHOLD PULSEWIDTH: 1 ms
MDC IDC MSMT LEADCHNL RA IMPEDANCE VALUE: 390 Ohm
MDC IDC MSMT LEADCHNL RA PACING THRESHOLD PULSEWIDTH: 1 ms
MDC IDC MSMT LEADCHNL RV IMPEDANCE VALUE: 550 Ohm
MDC IDC MSMT LEADCHNL RV PACING THRESHOLD AMPLITUDE: 0.75 V
MDC IDC MSMT LEADCHNL RV PACING THRESHOLD PULSEWIDTH: 0.5 ms
MDC IDC MSMT LEADCHNL RV SENSING INTR AMPL: 12 mV
MDC IDC SET LEADCHNL LV PACING AMPLITUDE: 3.75 V
MDC IDC SET LEADCHNL RA PACING AMPLITUDE: 2 V
MDC IDC SET LEADCHNL RV PACING PULSEWIDTH: 0.5 ms
MDC IDC STAT BRADY AP VP PERCENT: 98 %
MDC IDC STAT BRADY AP VS PERCENT: 1 %
MDC IDC STAT BRADY AS VP PERCENT: 1 %
MDC IDC STAT BRADY RA PERCENT PACED: 99 %
Pulse Gen Serial Number: 7247398

## 2015-09-17 DIAGNOSIS — C61 Malignant neoplasm of prostate: Secondary | ICD-10-CM | POA: Diagnosis not present

## 2015-09-23 ENCOUNTER — Other Ambulatory Visit: Payer: Self-pay | Admitting: Internal Medicine

## 2015-12-08 ENCOUNTER — Ambulatory Visit (INDEPENDENT_AMBULATORY_CARE_PROVIDER_SITE_OTHER): Payer: Medicare Other | Admitting: *Deleted

## 2015-12-08 DIAGNOSIS — I4901 Ventricular fibrillation: Secondary | ICD-10-CM

## 2015-12-08 NOTE — Progress Notes (Signed)
Remote ICD transmission.   

## 2015-12-16 ENCOUNTER — Encounter: Payer: Self-pay | Admitting: Cardiology

## 2016-01-01 LAB — CUP PACEART REMOTE DEVICE CHECK
Battery Remaining Longevity: 42 mo
Battery Remaining Percentage: 72 %
Battery Voltage: 2.99 V
Brady Statistic RA Percent Paced: 99 %
Date Time Interrogation Session: 20171205095540
HIGH POWER IMPEDANCE MEASURED VALUE: 44 Ohm
HighPow Impedance: 43 Ohm
Implantable Lead Implant Date: 20030319
Implantable Lead Implant Date: 20030319
Implantable Lead Location: 753859
Implantable Lead Location: 753860
Implantable Pulse Generator Implant Date: 20160516
Lead Channel Impedance Value: 450 Ohm
Lead Channel Impedance Value: 780 Ohm
Lead Channel Pacing Threshold Amplitude: 0.75 V
Lead Channel Pacing Threshold Amplitude: 2.75 V
Lead Channel Pacing Threshold Pulse Width: 1 ms
Lead Channel Sensing Intrinsic Amplitude: 12 mV
Lead Channel Setting Pacing Amplitude: 2 V
Lead Channel Setting Pacing Amplitude: 3.75 V
Lead Channel Setting Sensing Sensitivity: 0.5 mV
MDC IDC LEAD IMPLANT DT: 20110211
MDC IDC LEAD LOCATION: 753858
MDC IDC LEAD MODEL: 158
MDC IDC LEAD MODEL: 4087
MDC IDC LEAD SERIAL: 113412
MDC IDC LEAD SERIAL: 159867
MDC IDC MSMT LEADCHNL LV PACING THRESHOLD PULSEWIDTH: 1 ms
MDC IDC MSMT LEADCHNL RA IMPEDANCE VALUE: 380 Ohm
MDC IDC MSMT LEADCHNL RA PACING THRESHOLD AMPLITUDE: 1 V
MDC IDC MSMT LEADCHNL RA SENSING INTR AMPL: 2.2 mV
MDC IDC MSMT LEADCHNL RV PACING THRESHOLD PULSEWIDTH: 0.5 ms
MDC IDC PG SERIAL: 7247398
MDC IDC SET LEADCHNL LV PACING PULSEWIDTH: 1 ms
MDC IDC SET LEADCHNL RV PACING AMPLITUDE: 2.5 V
MDC IDC SET LEADCHNL RV PACING PULSEWIDTH: 0.5 ms
MDC IDC STAT BRADY AP VP PERCENT: 99 %
MDC IDC STAT BRADY AP VS PERCENT: 1 %
MDC IDC STAT BRADY AS VP PERCENT: 1 %
MDC IDC STAT BRADY AS VS PERCENT: 1 %

## 2016-01-15 DIAGNOSIS — N3020 Other chronic cystitis without hematuria: Secondary | ICD-10-CM | POA: Diagnosis not present

## 2016-01-15 DIAGNOSIS — C61 Malignant neoplasm of prostate: Secondary | ICD-10-CM | POA: Diagnosis not present

## 2016-03-08 ENCOUNTER — Ambulatory Visit (INDEPENDENT_AMBULATORY_CARE_PROVIDER_SITE_OTHER): Payer: Medicare Other | Admitting: *Deleted

## 2016-03-08 DIAGNOSIS — I4901 Ventricular fibrillation: Secondary | ICD-10-CM

## 2016-03-08 NOTE — Progress Notes (Signed)
Remote ICD transmission.   

## 2016-03-09 ENCOUNTER — Encounter: Payer: Self-pay | Admitting: Cardiology

## 2016-03-09 LAB — CUP PACEART REMOTE DEVICE CHECK
Battery Remaining Longevity: 40 mo
Battery Remaining Percentage: 68 %
Battery Voltage: 2.98 V
Brady Statistic RA Percent Paced: 99 %
Date Time Interrogation Session: 20180306070015
HIGH POWER IMPEDANCE MEASURED VALUE: 44 Ohm
HighPow Impedance: 44 Ohm
Implantable Lead Implant Date: 20030319
Implantable Lead Implant Date: 20030319
Implantable Lead Location: 753859
Implantable Lead Location: 753860
Implantable Pulse Generator Implant Date: 20160516
Lead Channel Impedance Value: 480 Ohm
Lead Channel Impedance Value: 830 Ohm
Lead Channel Pacing Threshold Amplitude: 0.75 V
Lead Channel Pacing Threshold Amplitude: 2.875 V
Lead Channel Pacing Threshold Pulse Width: 1 ms
Lead Channel Sensing Intrinsic Amplitude: 12 mV
Lead Channel Setting Pacing Amplitude: 2 V
Lead Channel Setting Pacing Amplitude: 3.875
Lead Channel Setting Sensing Sensitivity: 0.5 mV
MDC IDC LEAD IMPLANT DT: 20110211
MDC IDC LEAD LOCATION: 753858
MDC IDC LEAD SERIAL: 113412
MDC IDC LEAD SERIAL: 159867
MDC IDC MSMT LEADCHNL LV PACING THRESHOLD PULSEWIDTH: 1 ms
MDC IDC MSMT LEADCHNL RA IMPEDANCE VALUE: 380 Ohm
MDC IDC MSMT LEADCHNL RA PACING THRESHOLD AMPLITUDE: 1 V
MDC IDC MSMT LEADCHNL RA SENSING INTR AMPL: 1.6 mV
MDC IDC MSMT LEADCHNL RV PACING THRESHOLD PULSEWIDTH: 0.5 ms
MDC IDC PG SERIAL: 7247398
MDC IDC SET LEADCHNL LV PACING PULSEWIDTH: 1 ms
MDC IDC SET LEADCHNL RV PACING AMPLITUDE: 2.5 V
MDC IDC SET LEADCHNL RV PACING PULSEWIDTH: 0.5 ms
MDC IDC STAT BRADY AP VP PERCENT: 99 %
MDC IDC STAT BRADY AP VS PERCENT: 1 %
MDC IDC STAT BRADY AS VP PERCENT: 1 %
MDC IDC STAT BRADY AS VS PERCENT: 1 %

## 2016-06-13 ENCOUNTER — Ambulatory Visit (INDEPENDENT_AMBULATORY_CARE_PROVIDER_SITE_OTHER): Payer: Medicare Other | Admitting: Internal Medicine

## 2016-06-13 ENCOUNTER — Encounter: Payer: Self-pay | Admitting: Internal Medicine

## 2016-06-13 VITALS — BP 100/60 | HR 60 | Ht 66.0 in | Wt 196.0 lb

## 2016-06-13 DIAGNOSIS — Z95810 Presence of automatic (implantable) cardiac defibrillator: Secondary | ICD-10-CM

## 2016-06-13 DIAGNOSIS — I4729 Other ventricular tachycardia: Secondary | ICD-10-CM

## 2016-06-13 DIAGNOSIS — I472 Ventricular tachycardia: Secondary | ICD-10-CM

## 2016-06-13 DIAGNOSIS — I480 Paroxysmal atrial fibrillation: Secondary | ICD-10-CM | POA: Diagnosis not present

## 2016-06-13 DIAGNOSIS — I5022 Chronic systolic (congestive) heart failure: Secondary | ICD-10-CM | POA: Diagnosis not present

## 2016-06-13 MED ORDER — AMIODARONE HCL 200 MG PO TABS: 200.0000 mg | ORAL_TABLET | Freq: Every day | ORAL | 3 refills | Status: DC

## 2016-06-13 MED ORDER — CARVEDILOL 25 MG PO TABS: 25.0000 mg | ORAL_TABLET | Freq: Two times a day (BID) | ORAL | 3 refills | Status: DC

## 2016-06-13 MED ORDER — ASPIRIN EC 81 MG PO TBEC: 81.0000 mg | DELAYED_RELEASE_TABLET | Freq: Every day | ORAL | 3 refills | Status: DC

## 2016-06-13 MED ORDER — OMEPRAZOLE 20 MG PO CPDR: 20.0000 mg | DELAYED_RELEASE_CAPSULE | Freq: Every day | ORAL | 3 refills | Status: DC

## 2016-06-13 MED ORDER — SPIRONOLACTONE 25 MG PO TABS: 12.5000 mg | ORAL_TABLET | Freq: Every day | ORAL | 3 refills | Status: DC

## 2016-06-13 MED ORDER — ATORVASTATIN CALCIUM 20 MG PO TABS: 20.0000 mg | ORAL_TABLET | Freq: Every day | ORAL | 3 refills | Status: DC

## 2016-06-13 MED ORDER — AMIODARONE HCL 200 MG PO TABS: ORAL_TABLET | ORAL | 3 refills | Status: DC

## 2016-06-13 NOTE — Patient Instructions (Addendum)
Medication Instructions:  Your physician has recommended you make the following change in your medication:  DECREASE Amiodarone to 1 tablet (200 mg) Monday - Saturday only. Do not take on Sunday.  DECREASE Aspirin 81 mg to ONCE daily   Labwork: None Ordered   Testing/Procedures: None Ordered   Follow-Up: Your physician wants you to follow-up in: 1 year with Dr. Lovena Le. You will receive a reminder letter in the mail two months in advance. If you don't receive a letter, please call our office to schedule the follow-up appointment.  Remote monitoring is used to monitor your ICD from home. This monitoring reduces the number of office visits required to check your device to one time per year. It allows [name] to keep an eye on the functioning of your device to ensure it is working properly. You are scheduled for a device check from home on  09/12/16 . You may send your transmission at any time that day. If you have a wireless device, the transmission will be sent automatically. After your physician reviews your transmission, you will receive a postcard with your next transmission date.    Any Other Special Instructions Will Be Listed Below (If Applicable).     If you need a refill on your cardiac medications before your next appointment, please call your pharmacy.

## 2016-06-13 NOTE — Progress Notes (Signed)
HPI  Mr. Muscat returns today for followup. He is a pleasant 83 yo man with of an ischemic cardiomyopathy, chronic systolic heart failure, and left bundle branch block, status post ICD implantation. He denies chest pain or worsening heart failure symptoms. He remains active. He has had VT which is well controlled on amiodarone. He denies chest pain or shortness of breath.    No Known Allergies   Current Outpatient Prescriptions  Medication Sig Dispense Refill  . amiodarone (PACERONE) 200 MG tablet Take 1 tablet (200 mg total) by mouth daily. 90 tablet 3  . aspirin EC 81 MG tablet Take 162 mg by mouth daily.    [name] atorvastatin (LIPITOR) 20 MG tablet Take 1 tablet (20 mg total) by mouth at bedtime. 90 tablet 3  . carvedilol (COREG) 25 MG tablet Take 1 tablet (25 mg total) by mouth 2 (two) times daily with a meal. 180 tablet 3  . Cholecalciferol (VITAMIN D-3) 1000 UNITS CAPS Take 1,000 Units by mouth daily.     . indomethacin (INDOCIN SR) 75 MG CR capsule Take 75 mg by mouth daily as needed (gout attacks).    [name] omeprazole (PRILOSEC) 20 MG capsule Take 1 capsule (20 mg total) by mouth daily. 90 capsule 3  . spironolactone (ALDACTONE) 25 MG tablet Take 0.5 tablets (12.5 mg total) by mouth daily. 45 tablet 3  . Tamsulosin HCl (FLOMAX) 0.4 MG CAPS Take 0.4 mg by mouth daily after breakfast.      No current facility-administered medications for this visit.      Past Medical History:  Diagnosis Date  . CAD (coronary artery disease)   . CHF (congestive heart failure) ([place])   . Dyslipidemia   . History of ventricular fibrillation   . HTN (hypertension)   . Hx-sudden cardiac arrest   . Ischemic cardiomyopathy   . LBBB (left bundle branch block)   . Ventricular tachycardia ([place])     ROS:   All systems reviewed and negative except as noted in the HPI.   Past Surgical History:  Procedure Laterality Date  . CARDIAC CATHETERIZATION  10/12/2008  . CARDIAC DEFIBRILLATOR PLACEMENT     St Jude   . DOPPLER ECHOCARDIOGRAPHY  2008, 2011  . EP IMPLANTABLE DEVICE N/A 05/19/2014   Procedure: ICD/BIV ICD Generator Changeout;  Surgeon: Evans Lance, MD;  Location: [facility];  Service: Cardiovascular;  Laterality: N/A;  . TONSILECTOMY, ADENOIDECTOMY, BILATERAL MYRINGOTOMY AND TUBES       Family History  Problem Relation Age of Onset  . Coronary artery disease Other      Social History   Social History  . Marital status: Divorced    Spouse name: N/A  . Number of children: N/A  . Years of education: N/A   Occupational History  . furniture truck driver    Social History Main Topics  . Smoking status: Former Smoker    Quit date: 01/03/1985  . Smokeless tobacco: Never Used  . Alcohol use Not on file  . Drug use: Unknown  . Sexual activity: Not on file   Other Topics Concern  . Not on file   Social History Narrative  . No narrative on file     BP 100/60   Pulse 60   Ht 5\' 6"  (1.676 m)   Wt 196 lb (88.9 kg)   SpO2 98%   BMI 31.64 kg/m   Physical Exam:  Well appearing overweight, 83 year old man, NAD HEENT: Unremarkable Neck:  7 cm JVD, no  thyromegally Back:  No CVA tenderness Lungs:  Clear with no wheezes, rales, or rhonchi. HEART:  Regular rate rhythm, no murmurs, no rubs, no clicks Abd:  soft, positive bowel sounds, no organomegally, no rebound, no guarding Ext:  2 plus pulses, no edema, no cyanosis, no clubbing Skin:  No rashes no nodules Neuro:  CN II through XII intact, motor grossly intact   DEVICE  Normal device function.  See PaceArt for details.   Assess/Plan: 1. Chronic systolic heart failure - his symptoms are class 2. He will continue his current meds. I have asked him to reduce his salt intake. 2. ICD - His St. Jude BiV ICD is working normally.  3. VT - he has had no recurrent ventricular arrhythmias. I have asked him to reduce his dose of amio to 200 mg daily M-S, none on Sunday. 4. CAD/ICM - I have asked him to reduce his ASA to  81 mg daily. He denies anginal symptoms.   Mikle Bosworth.D.

## 2016-07-14 DIAGNOSIS — H25813 Combined forms of age-related cataract, bilateral: Secondary | ICD-10-CM | POA: Diagnosis not present

## 2016-07-14 DIAGNOSIS — H5203 Hypermetropia, bilateral: Secondary | ICD-10-CM | POA: Diagnosis not present

## 2016-07-14 DIAGNOSIS — H524 Presbyopia: Secondary | ICD-10-CM | POA: Diagnosis not present

## 2016-07-14 DIAGNOSIS — H52223 Regular astigmatism, bilateral: Secondary | ICD-10-CM | POA: Diagnosis not present

## 2016-07-14 DIAGNOSIS — H31012 Macula scars of posterior pole (postinflammatory) (post-traumatic), left eye: Secondary | ICD-10-CM | POA: Diagnosis not present

## 2016-07-14 DIAGNOSIS — I10 Essential (primary) hypertension: Secondary | ICD-10-CM | POA: Diagnosis not present

## 2016-07-18 DIAGNOSIS — E291 Testicular hypofunction: Secondary | ICD-10-CM | POA: Diagnosis not present

## 2016-07-18 DIAGNOSIS — C61 Malignant neoplasm of prostate: Secondary | ICD-10-CM | POA: Diagnosis not present

## 2016-07-18 DIAGNOSIS — R351 Nocturia: Secondary | ICD-10-CM | POA: Diagnosis not present

## 2016-07-18 DIAGNOSIS — N3020 Other chronic cystitis without hematuria: Secondary | ICD-10-CM | POA: Diagnosis not present

## 2016-09-08 ENCOUNTER — Other Ambulatory Visit: Payer: Self-pay | Admitting: Internal Medicine

## 2016-09-08 DIAGNOSIS — I5022 Chronic systolic (congestive) heart failure: Secondary | ICD-10-CM

## 2016-09-08 NOTE — Telephone Encounter (Signed)
Medication Detail    Disp Refills Start End   carvedilol (COREG) 25 MG tablet 180 tablet 3 06/13/2016    Sig - Route: Take 1 tablet (25 mg total) by mouth 2 (two) times daily with a meal. - Oral   Sent to pharmacy as: carvedilol (COREG) 25 MG tablet   E-Prescribing Status: Receipt confirmed by pharmacy (06/13/2016 8:20 AM EDT)   Associated Diagnoses   [place], [place], [place]

## 2016-09-12 ENCOUNTER — Ambulatory Visit (INDEPENDENT_AMBULATORY_CARE_PROVIDER_SITE_OTHER): Payer: Medicare Other | Admitting: *Deleted

## 2016-09-12 DIAGNOSIS — I472 Ventricular tachycardia: Secondary | ICD-10-CM

## 2016-09-12 DIAGNOSIS — I4729 Other ventricular tachycardia: Secondary | ICD-10-CM

## 2016-09-12 NOTE — Progress Notes (Signed)
Remote ICD transmission.   

## 2016-09-14 ENCOUNTER — Encounter: Payer: Self-pay | Admitting: Cardiology

## 2016-09-15 LAB — CUP PACEART REMOTE DEVICE CHECK
Date Time Interrogation Session: 20180913114945
Implantable Lead Implant Date: 20030319
Implantable Lead Location: 753858
Implantable Lead Location: 753859
Implantable Lead Location: 753860
Implantable Lead Serial Number: 113412
Implantable Lead Serial Number: 159867
Implantable Pulse Generator Implant Date: 20160516
Lead Channel Setting Pacing Amplitude: 2 V
Lead Channel Setting Pacing Amplitude: 2.5 V
Lead Channel Setting Pacing Amplitude: 3.875
Lead Channel Setting Pacing Pulse Width: 1 ms
Lead Channel Setting Sensing Sensitivity: 0.5 mV
MDC IDC LEAD IMPLANT DT: 20030319
MDC IDC LEAD IMPLANT DT: 20110211
MDC IDC SET LEADCHNL RV PACING PULSEWIDTH: 0.5 ms
Pulse Gen Serial Number: 7247398

## 2016-10-15 DIAGNOSIS — Z23 Encounter for immunization: Secondary | ICD-10-CM | POA: Diagnosis not present

## 2016-12-12 ENCOUNTER — Ambulatory Visit (INDEPENDENT_AMBULATORY_CARE_PROVIDER_SITE_OTHER): Payer: Medicare Other | Admitting: *Deleted

## 2016-12-12 DIAGNOSIS — I4901 Ventricular fibrillation: Secondary | ICD-10-CM

## 2016-12-13 NOTE — Progress Notes (Signed)
Remote pacemaker transmission.   

## 2016-12-16 ENCOUNTER — Encounter: Payer: Self-pay | Admitting: Cardiology

## 2017-01-05 LAB — CUP PACEART REMOTE DEVICE CHECK
Battery Voltage: 2.96 V
Brady Statistic AP VP Percent: 99 %
Brady Statistic AP VS Percent: 1 %
Brady Statistic AS VP Percent: 1 %
Brady Statistic RA Percent Paced: 99 %
HighPow Impedance: 43 Ohm
HighPow Impedance: 44 Ohm
Implantable Lead Implant Date: 20030319
Implantable Lead Implant Date: 20110211
Implantable Lead Location: 753860
Implantable Pulse Generator Implant Date: 20160516
Lead Channel Impedance Value: 360 Ohm
Lead Channel Pacing Threshold Amplitude: 0.75 V
Lead Channel Pacing Threshold Amplitude: 2.625 V
Lead Channel Pacing Threshold Pulse Width: 0.5 ms
Lead Channel Pacing Threshold Pulse Width: 1 ms
Lead Channel Sensing Intrinsic Amplitude: 12 mV
Lead Channel Setting Pacing Amplitude: 2 V
Lead Channel Setting Pacing Amplitude: 3.625
Lead Channel Setting Pacing Pulse Width: 0.5 ms
Lead Channel Setting Sensing Sensitivity: 0.5 mV
MDC IDC LEAD IMPLANT DT: 20030319
MDC IDC LEAD LOCATION: 753858
MDC IDC LEAD LOCATION: 753859
MDC IDC LEAD SERIAL: 113412
MDC IDC LEAD SERIAL: 159867
MDC IDC MSMT BATTERY REMAINING LONGEVITY: 33 mo
MDC IDC MSMT BATTERY REMAINING PERCENTAGE: 55 %
MDC IDC MSMT LEADCHNL LV IMPEDANCE VALUE: 780 Ohm
MDC IDC MSMT LEADCHNL RA PACING THRESHOLD AMPLITUDE: 1.25 V
MDC IDC MSMT LEADCHNL RA PACING THRESHOLD PULSEWIDTH: 1 ms
MDC IDC MSMT LEADCHNL RA SENSING INTR AMPL: 1.1 mV
MDC IDC MSMT LEADCHNL RV IMPEDANCE VALUE: 490 Ohm
MDC IDC SESS DTM: 20181210070019
MDC IDC SET LEADCHNL LV PACING PULSEWIDTH: 1 ms
MDC IDC SET LEADCHNL RV PACING AMPLITUDE: 2.5 V
MDC IDC STAT BRADY AS VS PERCENT: 1 %
Pulse Gen Serial Number: 7247398

## 2017-01-23 DIAGNOSIS — R351 Nocturia: Secondary | ICD-10-CM | POA: Diagnosis not present

## 2017-01-23 DIAGNOSIS — C61 Malignant neoplasm of prostate: Secondary | ICD-10-CM | POA: Diagnosis not present

## 2017-01-23 DIAGNOSIS — N3020 Other chronic cystitis without hematuria: Secondary | ICD-10-CM | POA: Diagnosis not present

## 2017-03-13 ENCOUNTER — Ambulatory Visit (INDEPENDENT_AMBULATORY_CARE_PROVIDER_SITE_OTHER): Payer: Medicare Other | Admitting: *Deleted

## 2017-03-13 DIAGNOSIS — I4901 Ventricular fibrillation: Secondary | ICD-10-CM | POA: Diagnosis not present

## 2017-03-13 NOTE — Progress Notes (Signed)
Remote ICD transmission.   

## 2017-03-15 ENCOUNTER — Encounter: Payer: Self-pay | Admitting: Cardiology

## 2017-04-01 LAB — CUP PACEART REMOTE DEVICE CHECK
Brady Statistic AP VP Percent: 99 %
Brady Statistic AP VS Percent: 1 %
Brady Statistic AS VS Percent: 1 %
HIGH POWER IMPEDANCE MEASURED VALUE: 45 Ohm
HighPow Impedance: 44 Ohm
Implantable Lead Implant Date: 20110211
Implantable Lead Location: 753858
Implantable Lead Model: 158
Implantable Lead Model: 4087
Lead Channel Impedance Value: 360 Ohm
Lead Channel Pacing Threshold Pulse Width: 0.5 ms
Lead Channel Pacing Threshold Pulse Width: 1 ms
Lead Channel Sensing Intrinsic Amplitude: 0.6 mV
Lead Channel Setting Pacing Amplitude: 2.5 V
Lead Channel Setting Pacing Pulse Width: 0.5 ms
MDC IDC LEAD IMPLANT DT: 20030319
MDC IDC LEAD IMPLANT DT: 20030319
MDC IDC LEAD LOCATION: 753859
MDC IDC LEAD LOCATION: 753860
MDC IDC LEAD SERIAL: 113412
MDC IDC LEAD SERIAL: 159867
MDC IDC MSMT BATTERY REMAINING LONGEVITY: 29 mo
MDC IDC MSMT BATTERY REMAINING PERCENTAGE: 51 %
MDC IDC MSMT BATTERY VOLTAGE: 2.96 V
MDC IDC MSMT LEADCHNL LV IMPEDANCE VALUE: 800 Ohm
MDC IDC MSMT LEADCHNL LV PACING THRESHOLD AMPLITUDE: 2.375 V
MDC IDC MSMT LEADCHNL RA PACING THRESHOLD AMPLITUDE: 1.25 V
MDC IDC MSMT LEADCHNL RA PACING THRESHOLD PULSEWIDTH: 1 ms
MDC IDC MSMT LEADCHNL RV IMPEDANCE VALUE: 490 Ohm
MDC IDC MSMT LEADCHNL RV PACING THRESHOLD AMPLITUDE: 0.75 V
MDC IDC MSMT LEADCHNL RV SENSING INTR AMPL: 12 mV
MDC IDC PG IMPLANT DT: 20160516
MDC IDC SESS DTM: 20190311070018
MDC IDC SET LEADCHNL LV PACING AMPLITUDE: 3.375
MDC IDC SET LEADCHNL LV PACING PULSEWIDTH: 1 ms
MDC IDC SET LEADCHNL RA PACING AMPLITUDE: 2 V
MDC IDC SET LEADCHNL RV SENSING SENSITIVITY: 0.5 mV
MDC IDC STAT BRADY AS VP PERCENT: 1 %
MDC IDC STAT BRADY RA PERCENT PACED: 99 %
Pulse Gen Serial Number: 7247398

## 2017-06-12 ENCOUNTER — Ambulatory Visit (INDEPENDENT_AMBULATORY_CARE_PROVIDER_SITE_OTHER): Payer: Medicare Other | Admitting: *Deleted

## 2017-06-12 DIAGNOSIS — I4901 Ventricular fibrillation: Secondary | ICD-10-CM

## 2017-06-12 NOTE — Progress Notes (Signed)
Remote pacemaker transmission.   

## 2017-06-13 ENCOUNTER — Encounter: Payer: Self-pay | Admitting: Cardiology

## 2017-06-27 LAB — CUP PACEART REMOTE DEVICE CHECK
Brady Statistic AP VS Percent: 1 %
Brady Statistic AS VP Percent: 1 %
Brady Statistic AS VS Percent: 1 %
HIGH POWER IMPEDANCE MEASURED VALUE: 43 Ohm
HighPow Impedance: 44 Ohm
Implantable Lead Implant Date: 20030319
Implantable Lead Implant Date: 20030319
Implantable Lead Location: 753858
Implantable Lead Location: 753859
Implantable Lead Model: 158
Implantable Lead Model: 4087
Implantable Lead Serial Number: 113412
Implantable Lead Serial Number: 159867
Implantable Pulse Generator Implant Date: 20160516
Lead Channel Impedance Value: 380 Ohm
Lead Channel Impedance Value: 480 Ohm
Lead Channel Impedance Value: 910 Ohm
Lead Channel Pacing Threshold Amplitude: 1.25 V
Lead Channel Pacing Threshold Pulse Width: 1 ms
Lead Channel Sensing Intrinsic Amplitude: 0.5 mV
Lead Channel Setting Pacing Amplitude: 2.5 V
Lead Channel Setting Pacing Amplitude: 3.75 V
Lead Channel Setting Pacing Pulse Width: 0.5 ms
Lead Channel Setting Pacing Pulse Width: 1 ms
MDC IDC LEAD IMPLANT DT: 20110211
MDC IDC LEAD LOCATION: 753860
MDC IDC MSMT BATTERY REMAINING LONGEVITY: 28 mo
MDC IDC MSMT BATTERY REMAINING PERCENTAGE: 47 %
MDC IDC MSMT BATTERY VOLTAGE: 2.95 V
MDC IDC MSMT LEADCHNL LV PACING THRESHOLD AMPLITUDE: 2.75 V
MDC IDC MSMT LEADCHNL LV PACING THRESHOLD PULSEWIDTH: 1 ms
MDC IDC MSMT LEADCHNL RV PACING THRESHOLD AMPLITUDE: 0.75 V
MDC IDC MSMT LEADCHNL RV PACING THRESHOLD PULSEWIDTH: 0.5 ms
MDC IDC MSMT LEADCHNL RV SENSING INTR AMPL: 12 mV
MDC IDC SESS DTM: 20190610060021
MDC IDC SET LEADCHNL RA PACING AMPLITUDE: 2 V
MDC IDC SET LEADCHNL RV SENSING SENSITIVITY: 0.5 mV
MDC IDC STAT BRADY AP VP PERCENT: 99 %
MDC IDC STAT BRADY RA PERCENT PACED: 98 %
Pulse Gen Serial Number: 7247398

## 2017-06-30 ENCOUNTER — Encounter: Payer: Self-pay | Admitting: Internal Medicine

## 2017-06-30 ENCOUNTER — Ambulatory Visit (INDEPENDENT_AMBULATORY_CARE_PROVIDER_SITE_OTHER): Payer: Medicare Other | Admitting: Internal Medicine

## 2017-06-30 VITALS — BP 102/66 | HR 60 | Ht 66.0 in | Wt 196.8 lb

## 2017-06-30 DIAGNOSIS — I480 Paroxysmal atrial fibrillation: Secondary | ICD-10-CM | POA: Diagnosis not present

## 2017-06-30 DIAGNOSIS — Z95810 Presence of automatic (implantable) cardiac defibrillator: Secondary | ICD-10-CM | POA: Diagnosis not present

## 2017-06-30 DIAGNOSIS — I5022 Chronic systolic (congestive) heart failure: Secondary | ICD-10-CM | POA: Diagnosis not present

## 2017-06-30 DIAGNOSIS — I4729 Other ventricular tachycardia: Secondary | ICD-10-CM

## 2017-06-30 DIAGNOSIS — I472 Ventricular tachycardia: Secondary | ICD-10-CM

## 2017-06-30 LAB — CUP PACEART INCLINIC DEVICE CHECK
Brady Statistic RV Percent Paced: 99 %
HIGH POWER IMPEDANCE MEASURED VALUE: 49.4722
Implantable Lead Implant Date: 20030319
Implantable Lead Implant Date: 20110211
Implantable Lead Location: 753860
Implantable Lead Model: 158
Implantable Lead Model: 4087
Implantable Pulse Generator Implant Date: 20160516
Lead Channel Impedance Value: 387.5 Ohm
Lead Channel Impedance Value: 462.5 Ohm
Lead Channel Pacing Threshold Amplitude: 0.75 V
Lead Channel Pacing Threshold Amplitude: 3 V
Lead Channel Pacing Threshold Pulse Width: 0.5 ms
Lead Channel Sensing Intrinsic Amplitude: 12 mV
Lead Channel Setting Pacing Amplitude: 2 V
Lead Channel Setting Pacing Amplitude: 2.5 V
Lead Channel Setting Pacing Pulse Width: 0.5 ms
Lead Channel Setting Pacing Pulse Width: 1 ms
MDC IDC LEAD IMPLANT DT: 20030319
MDC IDC LEAD LOCATION: 753858
MDC IDC LEAD LOCATION: 753859
MDC IDC LEAD SERIAL: 113412
MDC IDC LEAD SERIAL: 159867
MDC IDC MSMT BATTERY REMAINING LONGEVITY: 25 mo
MDC IDC MSMT LEADCHNL LV IMPEDANCE VALUE: 825 Ohm
MDC IDC MSMT LEADCHNL LV PACING THRESHOLD PULSEWIDTH: 1 ms
MDC IDC MSMT LEADCHNL RA PACING THRESHOLD AMPLITUDE: 1 V
MDC IDC MSMT LEADCHNL RA PACING THRESHOLD PULSEWIDTH: 1 ms
MDC IDC MSMT LEADCHNL RA SENSING INTR AMPL: 0.5 mV
MDC IDC PG SERIAL: 7247398
MDC IDC SESS DTM: 20190628110951
MDC IDC SET LEADCHNL LV PACING AMPLITUDE: 4 V
MDC IDC SET LEADCHNL RV SENSING SENSITIVITY: 0.5 mV
MDC IDC STAT BRADY RA PERCENT PACED: 98 %

## 2017-06-30 MED ORDER — CARVEDILOL 25 MG PO TABS: 25.0000 mg | ORAL_TABLET | Freq: Two times a day (BID) | ORAL | 3 refills | Status: DC

## 2017-06-30 MED ORDER — AMIODARONE HCL 200 MG PO TABS: ORAL_TABLET | ORAL | 3 refills | Status: DC

## 2017-06-30 MED ORDER — ATORVASTATIN CALCIUM 20 MG PO TABS: 20.0000 mg | ORAL_TABLET | Freq: Every day | ORAL | 3 refills | Status: DC

## 2017-06-30 MED ORDER — OMEPRAZOLE 20 MG PO CPDR: 20.0000 mg | DELAYED_RELEASE_CAPSULE | Freq: Every day | ORAL | 3 refills | Status: DC

## 2017-06-30 MED ORDER — SPIRONOLACTONE 25 MG PO TABS: 12.5000 mg | ORAL_TABLET | Freq: Every day | ORAL | 3 refills | Status: DC

## 2017-06-30 NOTE — Addendum Note (Signed)
Addended by: Willeen Cass A on: 06/30/2017 08:42 AM   Modules accepted: Orders

## 2017-06-30 NOTE — Patient Instructions (Signed)

## 2017-06-30 NOTE — Progress Notes (Signed)
HPI Mark Nguyen returns today for followup. He is a pleasant 84 yo man with of an ischemic cardiomyopathy, chronic systolic heart failure, and left bundle branch block, status post ICD implantation. He denies chest pain or worsening heart failure symptoms. He remains active. He has had VT which is well controlled on amiodarone. He denies chest pain or shortness of breath. In the interim, he has felt well. He has not fallen but he feels a little unsteady. No chest pain or sob.    No Known Allergies   Current Outpatient Medications  Medication Sig Dispense Refill  . amiodarone (PACERONE) 200 MG tablet Take 200 mg (1 tablet) daily Monday - Saturday only. (Patient taking differently: Take 200 mg (1 tablet) daily Monday - Saturday only.) 90 tablet 3  . aspirin EC 81 MG tablet Take 1 tablet (81 mg total) by mouth daily. 90 tablet 3  . atorvastatin (LIPITOR) 20 MG tablet Take 1 tablet (20 mg total) by mouth at bedtime. 90 tablet 3  . carvedilol (COREG) 25 MG tablet Take 1 tablet (25 mg total) by mouth 2 (two) times daily with a meal. 180 tablet 3  . Cholecalciferol (VITAMIN D-3) 1000 UNITS CAPS Take 1,000 Units by mouth daily.     . indomethacin (INDOCIN SR) 75 MG CR capsule Take 75 mg by mouth daily as needed (gout attacks).    [name] omeprazole (PRILOSEC) 20 MG capsule Take 1 capsule (20 mg total) by mouth daily. 90 capsule 3  . spironolactone (ALDACTONE) 25 MG tablet Take 0.5 tablets (12.5 mg total) by mouth daily. 45 tablet 3  . Tamsulosin HCl (FLOMAX) 0.4 MG CAPS Take 0.4 mg by mouth daily after breakfast.      No current facility-administered medications for this visit.      Past Medical History:  Diagnosis Date  . CAD (coronary artery disease)   . CHF (congestive heart failure) ([place])   . Dyslipidemia   . History of ventricular fibrillation   . HTN (hypertension)   . Hx-sudden cardiac arrest   . Ischemic cardiomyopathy   . LBBB (left bundle branch block)   . Ventricular  tachycardia ([facility])     ROS:   All systems reviewed and negative except as noted in the HPI.   Past Surgical History:  Procedure Laterality Date  . CARDIAC CATHETERIZATION  10/12/2008  . CARDIAC DEFIBRILLATOR PLACEMENT     St Jude  . DOPPLER ECHOCARDIOGRAPHY  2008, 2011  . EP IMPLANTABLE DEVICE N/A 05/19/2014   Procedure: ICD/BIV ICD Generator Changeout;  Surgeon: Evans Lance, MD;  Location: [facility];  Service: Cardiovascular;  Laterality: N/A;  . TONSILECTOMY, ADENOIDECTOMY, BILATERAL MYRINGOTOMY AND TUBES       Family History  Problem Relation Age of Onset  . Coronary artery disease Other      Social History   Socioeconomic History  . Marital status: Divorced    Spouse name: Not on file  . Number of children: Not on file  . Years of education: Not on file  . Highest education level: Not on file  Occupational History  . Occupation: Web designer  Social Needs  . Financial resource strain: Not on file  . Food insecurity:    Worry: Not on file    Inability: Not on file  . Transportation needs:    Medical: Not on file    Non-medical: Not on file  Tobacco Use  . Smoking status: Former Smoker    Last  attempt to quit: 01/03/1985    Years since quitting: 32.5  . Smokeless tobacco: Never Used  Substance and Sexual Activity  . Alcohol use: Not on file  . Drug use: Not on file  . Sexual activity: Not on file  Lifestyle  . Physical activity:    Days per week: Not on file    Minutes per session: Not on file  . Stress: Not on file  Relationships  . Social connections:    Talks on phone: Not on file    Gets together: Not on file    Attends religious service: Not on file    Active member of club or organization: Not on file    Attends meetings of clubs or organizations: Not on file    Relationship status: Not on file  . Intimate partner violence:    Fear of current or ex partner: Not on file    Emotionally abused: Not on file    Physically  abused: Not on file    Forced sexual activity: Not on file  Other Topics Concern  . Not on file  Social History Narrative  . Not on file     BP 102/66   Pulse 60   Ht 5\' 6"  (1.676 m)   Wt 196 lb 12.8 oz (89.3 kg)   SpO2 93%   BMI 31.76 kg/m   Physical Exam:  Well appearing 84 yo man, NAD HEENT: Unremarkable Neck:  No JVD, no thyromegally Lymphatics:  No adenopathy Back:  No CVA tenderness Lungs:  Clear with no wheezes HEART:  Regular rate rhythm, no murmurs, no rubs, no clicks Abd:  soft, positive bowel sounds, no organomegally, no rebound, no guarding Ext:  2 plus pulses, no edema, no cyanosis, no clubbing Skin:  No rashes no nodules Neuro:  CN II through XII intact, motor grossly intact  EKG - NSR with biv pacing  DEVICE  Normal device function.  See PaceArt for details.   Assess/Plan: 1. CHB - he is asymptomatic, s/p BIV ICD 2. ICD - his St. Jude device is working normally. His LV lead pacing threshold remains chronically elevated.  3. Chronic systolic heart failure - his symptoms are class 2. He will continue his current meds. He feels well. 4. VT - he has had no recurrent VT. He will continue low dose amio 200 mg daily but none on Sunday.   Mikle Bosworth.D.

## 2017-07-04 ENCOUNTER — Telehealth: Payer: Self-pay | Admitting: Internal Medicine

## 2017-07-04 NOTE — Telephone Encounter (Signed)
New message:       Pt states he is returning a call for the RN. Pt states we called and left a msg for the name of a primary Dr. Abbott Pao states he does not have a primary dr.

## 2017-07-04 NOTE — Telephone Encounter (Signed)
Returned call to Pt.  Advised Pt unsure who tried to call him today.  Per Pt woman introduced herself as Dr. Tanna Furry nurse.  Advised Pt I was Dr. Tanna Furry nurse and I did not try to call him.  Will update Pt record to indicate Pt has no PCP.  No further action needed.

## 2017-07-24 DIAGNOSIS — C61 Malignant neoplasm of prostate: Secondary | ICD-10-CM | POA: Diagnosis not present

## 2017-09-11 ENCOUNTER — Ambulatory Visit (INDEPENDENT_AMBULATORY_CARE_PROVIDER_SITE_OTHER): Payer: Medicare Other | Admitting: *Deleted

## 2017-09-11 DIAGNOSIS — I472 Ventricular tachycardia: Secondary | ICD-10-CM | POA: Diagnosis not present

## 2017-09-11 DIAGNOSIS — I4729 Other ventricular tachycardia: Secondary | ICD-10-CM

## 2017-09-11 NOTE — Progress Notes (Signed)
Remote ICD transmission.   

## 2017-10-03 LAB — CUP PACEART REMOTE DEVICE CHECK
Battery Remaining Longevity: 25 mo
Battery Remaining Percentage: 43 %
Brady Statistic AP VP Percent: 99 %
Brady Statistic AS VS Percent: 1 %
Brady Statistic RA Percent Paced: 99 %
HIGH POWER IMPEDANCE MEASURED VALUE: 48 Ohm
HIGH POWER IMPEDANCE MEASURED VALUE: 48 Ohm
Implantable Lead Implant Date: 20030319
Implantable Lead Location: 753859
Implantable Lead Model: 158
Implantable Lead Model: 4087
Implantable Lead Serial Number: 113412
Implantable Pulse Generator Implant Date: 20160516
Lead Channel Impedance Value: 400 Ohm
Lead Channel Impedance Value: 480 Ohm
Lead Channel Pacing Threshold Amplitude: 0.75 V
Lead Channel Pacing Threshold Amplitude: 1 V
Lead Channel Pacing Threshold Pulse Width: 0.5 ms
Lead Channel Pacing Threshold Pulse Width: 1 ms
Lead Channel Sensing Intrinsic Amplitude: 0.5 mV
Lead Channel Setting Pacing Amplitude: 2.5 V
Lead Channel Setting Pacing Amplitude: 3.5 V
Lead Channel Setting Pacing Pulse Width: 0.5 ms
MDC IDC LEAD IMPLANT DT: 20030319
MDC IDC LEAD IMPLANT DT: 20110211
MDC IDC LEAD LOCATION: 753858
MDC IDC LEAD LOCATION: 753860
MDC IDC LEAD SERIAL: 159867
MDC IDC MSMT BATTERY VOLTAGE: 2.95 V
MDC IDC MSMT LEADCHNL LV IMPEDANCE VALUE: 860 Ohm
MDC IDC MSMT LEADCHNL LV PACING THRESHOLD AMPLITUDE: 2.5 V
MDC IDC MSMT LEADCHNL RA PACING THRESHOLD PULSEWIDTH: 1 ms
MDC IDC MSMT LEADCHNL RV SENSING INTR AMPL: 12 mV
MDC IDC PG SERIAL: 7247398
MDC IDC SESS DTM: 20190909060017
MDC IDC SET LEADCHNL LV PACING PULSEWIDTH: 1 ms
MDC IDC SET LEADCHNL RA PACING AMPLITUDE: 2 V
MDC IDC SET LEADCHNL RV SENSING SENSITIVITY: 0.5 mV
MDC IDC STAT BRADY AP VS PERCENT: 1 %
MDC IDC STAT BRADY AS VP PERCENT: 1 %

## 2017-10-06 DIAGNOSIS — Z23 Encounter for immunization: Secondary | ICD-10-CM | POA: Diagnosis not present

## 2017-12-11 ENCOUNTER — Ambulatory Visit (INDEPENDENT_AMBULATORY_CARE_PROVIDER_SITE_OTHER): Payer: Medicare Other

## 2017-12-11 DIAGNOSIS — I469 Cardiac arrest, cause unspecified: Secondary | ICD-10-CM

## 2017-12-11 DIAGNOSIS — I5022 Chronic systolic (congestive) heart failure: Secondary | ICD-10-CM

## 2017-12-12 NOTE — Progress Notes (Signed)
Remote ICD transmission.   

## 2018-01-12 ENCOUNTER — Telehealth: Payer: Self-pay | Admitting: Internal Medicine

## 2018-01-12 ENCOUNTER — Telehealth: Payer: Self-pay | Admitting: Cardiology

## 2018-01-12 NOTE — Telephone Encounter (Signed)
MD Nurse wanted pt to send a manual transmission. Called pt and instructed him how to do this. Informed pt that we couldn't stay on the phone during the transmission w/ the monitor is plugged up w/ his landline phone. Pt verbalized understanding.

## 2018-01-12 NOTE — Telephone Encounter (Signed)
Returned call to Pt.  Per Pt he has been having intermittent left arm/shoulder pain over the last month.  Pain seems to move from shoulder, to arm to back of shoulder.  Pt states pain is similar to when he had his last MI.  Made appt to see Dr. Lovena Le on 01/15/2018.  Pt will send remote check. Office to call him to walk him through this process.  Advised to go to ER if increased chest pain/sob/nausea/sweating.  Pt indicates understanding.

## 2018-01-12 NOTE — Telephone Encounter (Signed)
Opened in error

## 2018-01-12 NOTE — Telephone Encounter (Signed)
New message   Patient states that he is having pain in arm and shoulder. He states that the pain radiates up and down his left arm. Patient states that this pain has been going on for a month.

## 2018-01-15 ENCOUNTER — Encounter: Payer: Self-pay | Admitting: Internal Medicine

## 2018-01-15 ENCOUNTER — Ambulatory Visit (INDEPENDENT_AMBULATORY_CARE_PROVIDER_SITE_OTHER): Payer: Medicare Other | Admitting: Internal Medicine

## 2018-01-15 ENCOUNTER — Other Ambulatory Visit: Payer: Self-pay | Admitting: Internal Medicine

## 2018-01-15 VITALS — BP 124/80 | HR 60 | Ht 67.0 in | Wt 196.8 lb

## 2018-01-15 DIAGNOSIS — R079 Chest pain, unspecified: Secondary | ICD-10-CM | POA: Diagnosis not present

## 2018-01-15 DIAGNOSIS — Z95810 Presence of automatic (implantable) cardiac defibrillator: Secondary | ICD-10-CM | POA: Diagnosis not present

## 2018-01-15 DIAGNOSIS — I5022 Chronic systolic (congestive) heart failure: Secondary | ICD-10-CM

## 2018-01-15 DIAGNOSIS — I472 Ventricular tachycardia: Secondary | ICD-10-CM

## 2018-01-15 DIAGNOSIS — I4729 Other ventricular tachycardia: Secondary | ICD-10-CM

## 2018-01-15 NOTE — Progress Notes (Signed)
HPI [name] returns today for followup. He is a pleasant 85yo man with of an ischemic cardiomyopathy, chronic systolic heart failure, and left bundle branch block, status post ICD implantation. He denies worsening heart failure symptoms. He remains active. He has had VT which is well controlled on amiodarone. He has not had chest pain but notes that he has had shoulder and arm pain which is not related to exertion but reminds him of the symptoms he had when he was found to have worsening CAD previously.   No Known Allergies   Current Outpatient Medications  Medication Sig Dispense Refill  . amiodarone (PACERONE) 200 MG tablet Take 200 mg (1 tablet) daily Monday - Saturday only. 90 tablet 3  . aspirin EC 81 MG tablet Take 1 tablet (81 mg total) by mouth daily. 90 tablet 3  . atorvastatin (LIPITOR) 20 MG tablet Take 1 tablet (20 mg total) by mouth at bedtime. 90 tablet 3  . carvedilol (COREG) 25 MG tablet Take 1 tablet (25 mg total) by mouth 2 (two) times daily with a meal. 180 tablet 3  . Cholecalciferol (VITAMIN D-3) 1000 UNITS CAPS Take 1,000 Units by mouth daily.     . indomethacin (INDOCIN SR) 75 MG CR capsule Take 75 mg by mouth daily as needed (gout attacks).    [name] omeprazole (PRILOSEC) 20 MG capsule Take 1 capsule (20 mg total) by mouth daily. 90 capsule 3  . spironolactone (ALDACTONE) 25 MG tablet Take 0.5 tablets (12.5 mg total) by mouth daily. 45 tablet 3  . Tamsulosin HCl (FLOMAX) 0.4 MG CAPS Take 0.4 mg by mouth daily after breakfast.      No current facility-administered medications for this visit.      Past Medical History:  Diagnosis Date  . CAD (coronary artery disease)   . CHF (congestive heart failure) ([place])   . Dyslipidemia   . History of ventricular fibrillation   . HTN (hypertension)   . Hx-sudden cardiac arrest   . Ischemic cardiomyopathy   . LBBB (left bundle branch block)   . Ventricular tachycardia ([place])     ROS:   All systems reviewed and  negative except as noted in the HPI.   Past Surgical History:  Procedure Laterality Date  . CARDIAC CATHETERIZATION  10/12/2008  . CARDIAC DEFIBRILLATOR PLACEMENT     St Jude  . DOPPLER ECHOCARDIOGRAPHY  2008, 2011  . EP IMPLANTABLE DEVICE N/A 05/19/2014   Procedure: ICD/BIV ICD Generator Changeout;  Surgeon: Evans Lance, MD;  Location: [facility];  Service: Cardiovascular;  Laterality: N/A;  . TONSILECTOMY, ADENOIDECTOMY, BILATERAL MYRINGOTOMY AND TUBES       Family History  Problem Relation Age of Onset  . Coronary artery disease Other      Social History   Socioeconomic History  . Marital status: Divorced    Spouse name: Not on file  . Number of children: Not on file  . Years of education: Not on file  . Highest education level: Not on file  Occupational History  . Occupation: Web designer  Social Needs  . Financial resource strain: Not on file  . Food insecurity:    Worry: Not on file    Inability: Not on file  . Transportation needs:    Medical: Not on file    Non-medical: Not on file  Tobacco Use  . Smoking status: Former Smoker    Last attempt to quit: 01/03/1985    Years since  quitting: 33.0  . Smokeless tobacco: Never Used  Substance and Sexual Activity  . Alcohol use: Not on file  . Drug use: Not on file  . Sexual activity: Not on file  Lifestyle  . Physical activity:    Days per week: Not on file    Minutes per session: Not on file  . Stress: Not on file  Relationships  . Social connections:    Talks on phone: Not on file    Gets together: Not on file    Attends religious service: Not on file    Active member of club or organization: Not on file    Attends meetings of clubs or organizations: Not on file    Relationship status: Not on file  . Intimate partner violence:    Fear of current or ex partner: Not on file    Emotionally abused: Not on file    Physically abused: Not on file    Forced sexual activity: Not on file    Other Topics Concern  . Not on file  Social History Narrative  . Not on file     BP 124/80   Pulse 60   Ht 5\' 7"  (1.702 m)   Wt 196 lb 12.8 oz (89.3 kg)   SpO2 97%   BMI 30.82 kg/m   Physical Exam:  Well appearing but overweight, NAD HEENT: Unremarkable Neck:  6 cm JVD, no thyromegally Lymphatics:  No adenopathy Back:  No CVA tenderness Lungs:  Clear with no wheezes HEART:  Regular rate rhythm, no murmurs, no rubs, no clicks Abd:  soft, positive bowel sounds, no organomegally, no rebound, no guarding Ext:  2 plus pulses, no edema, no cyanosis, no clubbing Skin:  No rashes no nodules Neuro:  CN II through XII intact, motor grossly intact  EKG - nsr with ventricular pacing  DEVICE  Not checked today  Assess/Plan: 1. Possible angina - his symptoms are atypical for coronary ischemia. However, he thinks that what he is experiencing is like when he had his prior problems. I have recommended he undergo a Lexiscan myoview in the setting of his pacing. 2. VT - he has had no symptoms. No change in his meds. 3. Chronic systolic heart failure - his symptoms are class 2. No change in his symptoms.  4. Atrial fib - he is maintaining NSR very nicely  Maquita Sandoval,M.D. Mikle Bosworth.D.

## 2018-01-15 NOTE — Patient Instructions (Addendum)
Medication Instructions:  Your physician recommends that you continue on your current medications as directed. Please refer to the Current Medication list given to you today.  Labwork: None ordered.  Testing/Procedures: Your physician has requested that you have a lexiscan myoview. For further information please visit [email or web address]. Please follow instruction sheet, as given.  Please schedule for lexiscan myoview.  Follow-Up: Your physician wants you to follow-up in: based on results of stress test.  Remote monitoring is used to monitor your ICD from home. This monitoring reduces the number of office visits required to check your device to one time per year. It allows [name] to keep an eye on the functioning of your device to ensure it is working properly. You are scheduled for a device check from home on 03/12/2018. You may send your transmission at any time that day. If you have a wireless device, the transmission will be sent automatically. After your physician reviews your transmission, you will receive a postcard with your next transmission date.  Any Other Special Instructions Will Be Listed Below (If Applicable).  If you need a refill on your cardiac medications before your next appointment, please call your pharmacy.

## 2018-01-16 ENCOUNTER — Ambulatory Visit (HOSPITAL_COMMUNITY)
Admission: RE | Admit: 2018-01-16 | Discharge: 2018-01-16 | Disposition: A | Discharge status: Home / Self Care | Payer: Medicare Other | Source: Ambulatory Visit | Attending: Cardiovascular Disease | Admitting: Cardiovascular Disease

## 2018-01-16 DIAGNOSIS — R079 Chest pain, unspecified: Secondary | ICD-10-CM | POA: Diagnosis not present

## 2018-01-16 LAB — MYOCARDIAL PERFUSION IMAGING
CHL CUP RESTING HR STRESS: 60 {beats}/min
CSEPPHR: 60 {beats}/min
LV dias vol: 307 mL (ref 62–150)
LVSYSVOL: 235 mL
SDS: 2
SRS: 32
SSS: 34
TID: 1.07

## 2018-01-16 MED ORDER — TECHNETIUM TC 99M TETROFOSMIN IV KIT
10.6000 | PACK | Freq: Once | INTRAVENOUS | Status: AC | PRN
  Administered 2018-01-16: 10.6 via INTRAVENOUS
  Filled 2018-01-16: qty 11

## 2018-01-16 MED ORDER — REGADENOSON 0.4 MG/5ML IV SOLN
0.4000 mg | Freq: Once | INTRAVENOUS | Status: AC
  Administered 2018-01-16: 0.4 mg via INTRAVENOUS

## 2018-01-16 MED ORDER — TECHNETIUM TC 99M TETROFOSMIN IV KIT
31.0000 | PACK | Freq: Once | INTRAVENOUS | Status: AC | PRN
  Administered 2018-01-16: 31 via INTRAVENOUS
  Filled 2018-01-16: qty 31

## 2018-01-18 ENCOUNTER — Telehealth: Payer: Self-pay

## 2018-01-18 MED ORDER — ISOSORBIDE MONONITRATE ER 30 MG PO TB24: 15.0000 mg | ORAL_TABLET | Freq: Every day | ORAL | 3 refills | Status: DC

## 2018-01-18 NOTE — Telephone Encounter (Signed)
Left message for Pt to return call.

## 2018-01-18 NOTE — Telephone Encounter (Signed)
Pt returned this nurse call.  Pt advised of results of stress test.  Advised per Dr. Lovena Le --start Imdur 15 mg one tablet by mouth daily.  Discussed medication and side effects.  Pt indicates understanding.  Pt scheduled for follow up on February 23, 2018 at 4:00 pm.

## 2018-01-26 DIAGNOSIS — N3020 Other chronic cystitis without hematuria: Secondary | ICD-10-CM | POA: Diagnosis not present

## 2018-01-26 DIAGNOSIS — C61 Malignant neoplasm of prostate: Secondary | ICD-10-CM | POA: Diagnosis not present

## 2018-01-27 LAB — CUP PACEART REMOTE DEVICE CHECK
Battery Remaining Longevity: 23 mo
Battery Voltage: 2.95 V
Brady Statistic AP VP Percent: 99 %
Brady Statistic AP VS Percent: 1 %
Brady Statistic AS VP Percent: 1 %
Brady Statistic AS VS Percent: 1 %
Brady Statistic RA Percent Paced: 99 %
Date Time Interrogation Session: 20191209070014
HighPow Impedance: 49 Ohm
HighPow Impedance: 50 Ohm
Implantable Lead Implant Date: 20030319
Implantable Lead Implant Date: 20030319
Implantable Lead Implant Date: 20110211
Implantable Lead Location: 753858
Implantable Lead Location: 753860
Implantable Lead Model: 158
Implantable Lead Model: 4087
Implantable Lead Serial Number: 159867
Implantable Pulse Generator Implant Date: 20160516
Lead Channel Impedance Value: 390 Ohm
Lead Channel Impedance Value: 490 Ohm
Lead Channel Impedance Value: 790 Ohm
Lead Channel Pacing Threshold Amplitude: 0.75 V
Lead Channel Pacing Threshold Amplitude: 1 V
Lead Channel Pacing Threshold Amplitude: 2.625 V
Lead Channel Pacing Threshold Pulse Width: 0.5 ms
Lead Channel Pacing Threshold Pulse Width: 1 ms
Lead Channel Sensing Intrinsic Amplitude: 0.5 mV
Lead Channel Sensing Intrinsic Amplitude: 12 mV
Lead Channel Setting Pacing Amplitude: 2 V
Lead Channel Setting Pacing Amplitude: 2.5 V
Lead Channel Setting Pacing Pulse Width: 0.5 ms
Lead Channel Setting Pacing Pulse Width: 1 ms
Lead Channel Setting Sensing Sensitivity: 0.5 mV
MDC IDC LEAD LOCATION: 753859
MDC IDC LEAD SERIAL: 113412
MDC IDC MSMT BATTERY REMAINING PERCENTAGE: 39 %
MDC IDC MSMT LEADCHNL RA PACING THRESHOLD PULSEWIDTH: 1 ms
MDC IDC SET LEADCHNL LV PACING AMPLITUDE: 3.625
Pulse Gen Serial Number: 7247398

## 2018-02-01 ENCOUNTER — Encounter: Payer: Self-pay | Admitting: Internal Medicine

## 2018-02-23 ENCOUNTER — Ambulatory Visit (INDEPENDENT_AMBULATORY_CARE_PROVIDER_SITE_OTHER): Payer: Medicare Other | Admitting: Internal Medicine

## 2018-02-23 ENCOUNTER — Encounter: Payer: Self-pay | Admitting: Internal Medicine

## 2018-02-23 VITALS — BP 130/70 | HR 60 | Ht 67.0 in | Wt 204.0 lb

## 2018-02-23 DIAGNOSIS — Z95810 Presence of automatic (implantable) cardiac defibrillator: Secondary | ICD-10-CM

## 2018-02-23 DIAGNOSIS — I472 Ventricular tachycardia, unspecified: Secondary | ICD-10-CM

## 2018-02-23 DIAGNOSIS — I480 Paroxysmal atrial fibrillation: Secondary | ICD-10-CM | POA: Diagnosis not present

## 2018-02-23 DIAGNOSIS — I4729 Other ventricular tachycardia: Secondary | ICD-10-CM

## 2018-02-23 DIAGNOSIS — I5022 Chronic systolic (congestive) heart failure: Secondary | ICD-10-CM | POA: Diagnosis not present

## 2018-02-23 MED ORDER — ATORVASTATIN CALCIUM 20 MG PO TABS: 20.0000 mg | ORAL_TABLET | Freq: Every day | ORAL | 3 refills | Status: DC

## 2018-02-23 MED ORDER — SPIRONOLACTONE 25 MG PO TABS: 12.5000 mg | ORAL_TABLET | Freq: Every day | ORAL | 3 refills | Status: DC

## 2018-02-23 MED ORDER — CARVEDILOL 25 MG PO TABS: 25.0000 mg | ORAL_TABLET | Freq: Two times a day (BID) | ORAL | 3 refills | Status: DC

## 2018-02-23 MED ORDER — AMIODARONE HCL 200 MG PO TABS: ORAL_TABLET | ORAL | 3 refills | Status: DC

## 2018-02-23 MED ORDER — ISOSORBIDE MONONITRATE ER 30 MG PO TB24: 15.0000 mg | ORAL_TABLET | Freq: Every day | ORAL | 3 refills | Status: DC

## 2018-02-23 NOTE — Progress Notes (Signed)
HPI Mark Nguyen returns today for followup. He is a pleasant 85yo man with of an ischemic cardiomyopathy, chronic systolic heart failure, and left bundle branch block, status post ICD implantation. He denies chest pain or worsening heart failure symptoms. He remains active. He has had VT which is well controlled on amiodarone. He denies chest pain or shortness of breath. In the interim, he has felt well. He had some arm pain but a stress test was negative for ischemia. He was placed on low dose imdur. His arm pain has resolved. No Known Allergies   Current Outpatient Medications  Medication Sig Dispense Refill  . amiodarone (PACERONE) 200 MG tablet Take 200 mg (1 tablet) daily Monday - Saturday only. 90 tablet 3  . aspirin EC 81 MG tablet Take 1 tablet (81 mg total) by mouth daily. 90 tablet 3  . atorvastatin (LIPITOR) 20 MG tablet Take 1 tablet (20 mg total) by mouth at bedtime. 90 tablet 3  . carvedilol (COREG) 25 MG tablet Take 1 tablet (25 mg total) by mouth 2 (two) times daily with a meal. 180 tablet 3  . Cholecalciferol (VITAMIN D-3) 1000 UNITS CAPS Take 1,000 Units by mouth daily.     . indomethacin (INDOCIN SR) 75 MG CR capsule Take 75 mg by mouth daily as needed (gout attacks).    . isosorbide mononitrate (IMDUR) 30 MG 24 hr tablet Take 0.5 tablets (15 mg total) by mouth daily. 45 tablet 3  . omeprazole (PRILOSEC) 20 MG capsule Take 1 capsule (20 mg total) by mouth daily. 90 capsule 3  . spironolactone (ALDACTONE) 25 MG tablet Take 0.5 tablets (12.5 mg total) by mouth daily. 45 tablet 3  . Tamsulosin HCl (FLOMAX) 0.4 MG CAPS Take 0.4 mg by mouth daily after breakfast.      No current facility-administered medications for this visit.      Past Medical History:  Diagnosis Date  . CAD (coronary artery disease)   . CHF (congestive heart failure) ([place])   . Dyslipidemia   . History of ventricular fibrillation   . HTN (hypertension)   . Hx-sudden cardiac arrest   . Ischemic  cardiomyopathy   . LBBB (left bundle branch block)   . Ventricular tachycardia ([place])     ROS:   All systems reviewed and negative except as noted in the HPI.   Past Surgical History:  Procedure Laterality Date  . CARDIAC CATHETERIZATION  10/12/2008  . CARDIAC DEFIBRILLATOR PLACEMENT     [employer] Jude  . DOPPLER ECHOCARDIOGRAPHY  2008, 2011  . EP IMPLANTABLE DEVICE N/A 05/19/2014   Procedure: ICD/BIV ICD Generator Changeout;  Surgeon: Mark Lance, MD;  Location: [facility];  Service: Cardiovascular;  Laterality: N/A;  . TONSILECTOMY, ADENOIDECTOMY, BILATERAL MYRINGOTOMY AND TUBES       Family History  Problem Relation Age of Onset  . Coronary artery disease Other      Social History   Socioeconomic History  . Marital status: Divorced    Spouse name: Not on file  . Number of children: Not on file  . Years of education: Not on file  . Highest education level: Not on file  Occupational History  . Occupation: Web designer  Social Needs  . Financial resource strain: Not on file  . Food insecurity:    Worry: Not on file    Inability: Not on file  . Transportation needs:    Medical: Not on file    Non-medical: Not  on file  Tobacco Use  . Smoking status: Former Smoker    Last attempt to quit: 01/03/1985    Years since quitting: 33.1  . Smokeless tobacco: Never Used  Substance and Sexual Activity  . Alcohol use: Never    Frequency: Never  . Drug use: Never  . Sexual activity: Not on file  Lifestyle  . Physical activity:    Days per week: Not on file    Minutes per session: Not on file  . Stress: Not on file  Relationships  . Social connections:    Talks on phone: Not on file    Gets together: Not on file    Attends religious service: Not on file    Active member of club or organization: Not on file    Attends meetings of clubs or organizations: Not on file    Relationship status: Not on file  . Intimate partner violence:    Fear of current or ex  partner: Not on file    Emotionally abused: Not on file    Physically abused: Not on file    Forced sexual activity: Not on file  Other Topics Concern  . Not on file  Social History Narrative  . Not on file     BP 130/70   Pulse 60   Ht 5\' 7"  (1.702 m)   Wt 204 lb (92.5 kg)   BMI 31.95 kg/m   Physical Exam:  Well appearing NAD HEENT: Unremarkable Neck:  No JVD, no thyromegally Lymphatics:  No adenopathy Back:  No CVA tenderness Lungs:  Clear with no wheezes HEART:  Regular rate rhythm, no murmurs, no rubs, no clicks Abd:  soft, positive bowel sounds, no organomegally, no rebound, no guarding Ext:  2 plus pulses, no edema, no cyanosis, no clubbing Skin:  No rashes no nodules Neuro:  CN II through XII intact, motor grossly intact  EKG - nsr with biv pacing  DEVICE  Normal device function.  See PaceArt for details.   Assess/Plan: 1. ICM - he is now asymptomatic. He will continue his imdur. 2. Chronic systolic heart failure - his symptoms remain class 2. He will continue his current meds. 3. VT - he has had none on low dose amiodarone. 4. ICD - his [place] ICD working normally.  Mark Nguyen.D.

## 2018-02-23 NOTE — Patient Instructions (Addendum)
Medication Instructions:  Your physician recommends that you continue on your current medications as directed. Please refer to the Current Medication list given to you today.  Labwork: None ordered.  Testing/Procedures: None ordered.  Follow-Up: Your physician wants you to follow-up in: August with Dr. Lovena Le.   I will send you a recall letter when it gets closer to time to come in.    Remote monitoring is used to monitor your ICD from home. This monitoring reduces the number of office visits required to check your device to one time per year. It allows [name] to keep an eye on the functioning of your device to ensure it is working properly. You are scheduled for a device check from home on 03/12/2018. You may send your transmission at any time that day. If you have a wireless device, the transmission will be sent automatically. After your physician reviews your transmission, you will receive a postcard with your next transmission date.  Any Other Special Instructions Will Be Listed Below (If Applicable).  If you need a refill on your cardiac medications before your next appointment, please call your pharmacy.

## 2018-02-24 ENCOUNTER — Encounter: Payer: Self-pay | Admitting: Internal Medicine

## 2018-02-26 LAB — CUP PACEART INCLINIC DEVICE CHECK
Battery Remaining Longevity: 22 mo
Brady Statistic RV Percent Paced: 99.63 %
Date Time Interrogation Session: 20200221214941
HighPow Impedance: 46.3605
Implantable Lead Implant Date: 20030319
Implantable Lead Implant Date: 20030319
Implantable Lead Implant Date: 20110211
Implantable Lead Location: 753858
Implantable Lead Location: 753859
Implantable Lead Model: 158
Implantable Lead Model: 4087
Implantable Lead Serial Number: 113412
Implantable Lead Serial Number: 159867
Implantable Pulse Generator Implant Date: 20160516
Lead Channel Impedance Value: 475 Ohm
Lead Channel Impedance Value: 787.5 Ohm
Lead Channel Pacing Threshold Amplitude: 0.75 V
Lead Channel Pacing Threshold Amplitude: 0.75 V
Lead Channel Pacing Threshold Amplitude: 1.25 V
Lead Channel Pacing Threshold Amplitude: 1.25 V
Lead Channel Pacing Threshold Amplitude: 1.625 V
Lead Channel Pacing Threshold Pulse Width: 0.5 ms
Lead Channel Pacing Threshold Pulse Width: 1 ms
Lead Channel Pacing Threshold Pulse Width: 1 ms
Lead Channel Pacing Threshold Pulse Width: 1 ms
Lead Channel Sensing Intrinsic Amplitude: 12 mV
Lead Channel Sensing Intrinsic Amplitude: 2 mV
Lead Channel Setting Pacing Amplitude: 2.125
Lead Channel Setting Pacing Amplitude: 2.5 V
Lead Channel Setting Pacing Amplitude: 2.5 V
Lead Channel Setting Pacing Pulse Width: 0.5 ms
Lead Channel Setting Pacing Pulse Width: 1 ms
Lead Channel Setting Sensing Sensitivity: 0.5 mV
MDC IDC LEAD LOCATION: 753860
MDC IDC MSMT LEADCHNL RA IMPEDANCE VALUE: 387.5 Ohm
MDC IDC MSMT LEADCHNL RV PACING THRESHOLD PULSEWIDTH: 0.5 ms
MDC IDC STAT BRADY RA PERCENT PACED: 99.35 %
Pulse Gen Serial Number: 7247398

## 2018-03-12 ENCOUNTER — Ambulatory Visit (INDEPENDENT_AMBULATORY_CARE_PROVIDER_SITE_OTHER): Payer: Medicare Other | Admitting: *Deleted

## 2018-03-12 DIAGNOSIS — I469 Cardiac arrest, cause unspecified: Secondary | ICD-10-CM | POA: Diagnosis not present

## 2018-03-12 DIAGNOSIS — I442 Atrioventricular block, complete: Secondary | ICD-10-CM

## 2018-03-13 LAB — CUP PACEART REMOTE DEVICE CHECK
Battery Remaining Longevity: 23 mo
Battery Remaining Percentage: 35 %
Battery Voltage: 2.93 V
Brady Statistic AP VS Percent: 1 %
Brady Statistic AS VP Percent: 1 %
Brady Statistic AS VS Percent: 1 %
Brady Statistic RA Percent Paced: 99 %
Date Time Interrogation Session: 20200309070016
HighPow Impedance: 45 Ohm
HighPow Impedance: 45 Ohm
Implantable Lead Implant Date: 20030319
Implantable Lead Implant Date: 20030319
Implantable Lead Implant Date: 20110211
Implantable Lead Location: 753859
Implantable Lead Model: 158
Implantable Lead Model: 4087
Implantable Lead Serial Number: 113412
Implantable Lead Serial Number: 159867
Implantable Pulse Generator Implant Date: 20160516
Lead Channel Impedance Value: 380 Ohm
Lead Channel Impedance Value: 460 Ohm
Lead Channel Impedance Value: 750 Ohm
Lead Channel Pacing Threshold Amplitude: 0.75 V
Lead Channel Pacing Threshold Amplitude: 1.25 V
Lead Channel Pacing Threshold Amplitude: 2.5 V
Lead Channel Pacing Threshold Pulse Width: 0.5 ms
Lead Channel Pacing Threshold Pulse Width: 1 ms
Lead Channel Pacing Threshold Pulse Width: 1 ms
Lead Channel Sensing Intrinsic Amplitude: 12 mV
Lead Channel Setting Pacing Amplitude: 2.5 V
Lead Channel Setting Pacing Amplitude: 3 V
Lead Channel Setting Pacing Pulse Width: 1 ms
Lead Channel Setting Sensing Sensitivity: 0.5 mV
MDC IDC LEAD LOCATION: 753858
MDC IDC LEAD LOCATION: 753860
MDC IDC MSMT LEADCHNL RA SENSING INTR AMPL: 2.1 mV
MDC IDC SET LEADCHNL RV PACING AMPLITUDE: 2.5 V
MDC IDC SET LEADCHNL RV PACING PULSEWIDTH: 0.5 ms
MDC IDC STAT BRADY AP VP PERCENT: 99 %
Pulse Gen Serial Number: 7247398

## 2018-03-20 NOTE — Progress Notes (Signed)
Remote ICD transmission.   

## 2018-06-11 ENCOUNTER — Ambulatory Visit (INDEPENDENT_AMBULATORY_CARE_PROVIDER_SITE_OTHER): Payer: Medicare Other | Admitting: *Deleted

## 2018-06-11 DIAGNOSIS — I442 Atrioventricular block, complete: Secondary | ICD-10-CM

## 2018-06-11 DIAGNOSIS — I469 Cardiac arrest, cause unspecified: Secondary | ICD-10-CM | POA: Diagnosis not present

## 2018-06-11 LAB — CUP PACEART REMOTE DEVICE CHECK
Battery Remaining Longevity: 20 mo
Battery Remaining Percentage: 31 %
Battery Voltage: 2.93 V
Brady Statistic AP VP Percent: 99 %
Brady Statistic AP VS Percent: 1 %
Brady Statistic AS VP Percent: 1 %
Brady Statistic AS VS Percent: 1 %
Brady Statistic RA Percent Paced: 99 %
Date Time Interrogation Session: 20200608060027
HighPow Impedance: 47 Ohm
HighPow Impedance: 47 Ohm
Implantable Lead Implant Date: 20030319
Implantable Lead Implant Date: 20030319
Implantable Lead Implant Date: 20110211
Implantable Lead Location: 753858
Implantable Lead Location: 753859
Implantable Lead Location: 753860
Implantable Lead Model: 158
Implantable Lead Model: 4087
Implantable Lead Serial Number: 113412
Implantable Lead Serial Number: 159867
Implantable Pulse Generator Implant Date: 20160516
Lead Channel Impedance Value: 380 Ohm
Lead Channel Impedance Value: 460 Ohm
Lead Channel Impedance Value: 760 Ohm
Lead Channel Pacing Threshold Amplitude: 0.75 V
Lead Channel Pacing Threshold Amplitude: 1.25 V
Lead Channel Pacing Threshold Amplitude: 2.375 V
Lead Channel Pacing Threshold Pulse Width: 0.5 ms
Lead Channel Pacing Threshold Pulse Width: 1 ms
Lead Channel Pacing Threshold Pulse Width: 1 ms
Lead Channel Sensing Intrinsic Amplitude: 0.5 mV
Lead Channel Sensing Intrinsic Amplitude: 12 mV
Lead Channel Setting Pacing Amplitude: 2.5 V
Lead Channel Setting Pacing Amplitude: 2.5 V
Lead Channel Setting Pacing Amplitude: 2.875
Lead Channel Setting Pacing Pulse Width: 0.5 ms
Lead Channel Setting Pacing Pulse Width: 1 ms
Lead Channel Setting Sensing Sensitivity: 0.5 mV
Pulse Gen Serial Number: 7247398

## 2018-06-19 NOTE — Progress Notes (Signed)
Remote ICD transmission.   

## 2018-07-11 ENCOUNTER — Other Ambulatory Visit: Payer: Self-pay | Admitting: Internal Medicine

## 2018-07-30 DIAGNOSIS — N318 Other neuromuscular dysfunction of bladder: Secondary | ICD-10-CM | POA: Diagnosis not present

## 2018-07-30 DIAGNOSIS — C61 Malignant neoplasm of prostate: Secondary | ICD-10-CM | POA: Diagnosis not present

## 2018-07-30 DIAGNOSIS — N3020 Other chronic cystitis without hematuria: Secondary | ICD-10-CM | POA: Diagnosis not present

## 2018-08-31 ENCOUNTER — Other Ambulatory Visit: Payer: Self-pay | Admitting: Internal Medicine

## 2018-08-31 DIAGNOSIS — I5022 Chronic systolic (congestive) heart failure: Secondary | ICD-10-CM

## 2018-09-11 ENCOUNTER — Ambulatory Visit (INDEPENDENT_AMBULATORY_CARE_PROVIDER_SITE_OTHER): Payer: Medicare Other | Admitting: *Deleted

## 2018-09-11 DIAGNOSIS — I469 Cardiac arrest, cause unspecified: Secondary | ICD-10-CM | POA: Diagnosis not present

## 2018-09-11 LAB — CUP PACEART REMOTE DEVICE CHECK
Battery Remaining Longevity: 19 mo
Battery Remaining Percentage: 28 %
Battery Voltage: 2.92 V
Brady Statistic AP VP Percent: 99 %
Brady Statistic AP VS Percent: 1 %
Brady Statistic AS VP Percent: 1 %
Brady Statistic AS VS Percent: 1 %
Brady Statistic RA Percent Paced: 99 %
Date Time Interrogation Session: 20200907060019
HighPow Impedance: 49 Ohm
HighPow Impedance: 50 Ohm
Implantable Lead Implant Date: 20030319
Implantable Lead Implant Date: 20030319
Implantable Lead Implant Date: 20110211
Implantable Lead Location: 753858
Implantable Lead Location: 753859
Implantable Lead Location: 753860
Implantable Lead Model: 158
Implantable Lead Model: 4087
Implantable Lead Serial Number: 113412
Implantable Lead Serial Number: 159867
Implantable Pulse Generator Implant Date: 20160516
Lead Channel Impedance Value: 390 Ohm
Lead Channel Impedance Value: 460 Ohm
Lead Channel Impedance Value: 830 Ohm
Lead Channel Pacing Threshold Amplitude: 0.75 V
Lead Channel Pacing Threshold Amplitude: 1.25 V
Lead Channel Pacing Threshold Amplitude: 2.5 V
Lead Channel Pacing Threshold Pulse Width: 0.5 ms
Lead Channel Pacing Threshold Pulse Width: 1 ms
Lead Channel Pacing Threshold Pulse Width: 1 ms
Lead Channel Sensing Intrinsic Amplitude: 0.9 mV
Lead Channel Sensing Intrinsic Amplitude: 12 mV
Lead Channel Setting Pacing Amplitude: 2.5 V
Lead Channel Setting Pacing Amplitude: 2.5 V
Lead Channel Setting Pacing Amplitude: 3 V
Lead Channel Setting Pacing Pulse Width: 0.5 ms
Lead Channel Setting Pacing Pulse Width: 1 ms
Lead Channel Setting Sensing Sensitivity: 0.5 mV
Pulse Gen Serial Number: 7247398

## 2018-09-26 ENCOUNTER — Encounter: Payer: Self-pay | Admitting: Cardiology

## 2018-09-26 NOTE — Progress Notes (Signed)
Remote ICD transmission.   

## 2018-09-28 ENCOUNTER — Encounter: Payer: Self-pay | Admitting: Internal Medicine

## 2018-09-28 ENCOUNTER — Encounter (INDEPENDENT_AMBULATORY_CARE_PROVIDER_SITE_OTHER): Payer: Self-pay

## 2018-09-28 ENCOUNTER — Ambulatory Visit (INDEPENDENT_AMBULATORY_CARE_PROVIDER_SITE_OTHER): Payer: Medicare Other | Admitting: Internal Medicine

## 2018-09-28 ENCOUNTER — Other Ambulatory Visit: Payer: Self-pay

## 2018-09-28 VITALS — BP 134/80 | HR 60 | Ht 67.0 in | Wt 197.0 lb

## 2018-09-28 DIAGNOSIS — Z95810 Presence of automatic (implantable) cardiac defibrillator: Secondary | ICD-10-CM

## 2018-09-28 DIAGNOSIS — I5022 Chronic systolic (congestive) heart failure: Secondary | ICD-10-CM

## 2018-09-28 DIAGNOSIS — I4729 Other ventricular tachycardia: Secondary | ICD-10-CM

## 2018-09-28 DIAGNOSIS — I472 Ventricular tachycardia: Secondary | ICD-10-CM

## 2018-09-28 MED ORDER — AMIODARONE HCL 200 MG PO TABS: ORAL_TABLET | ORAL | 3 refills | Status: DC

## 2018-09-28 NOTE — Progress Notes (Signed)
HPI Mark Nguyen returns today for followup. He is a pleasant 85 yo man with a h/o chronic systolic heart failure, dyslipidemia, VT, s/p ICD insertion. He has done well in the interim with no chest pain or sob. He admits to some instability when he walks. He is sedentary. No Known Allergies   Current Outpatient Medications  Medication Sig Dispense Refill  . aspirin EC 81 MG tablet Take 1 tablet (81 mg total) by mouth daily. 90 tablet 3  . atorvastatin (LIPITOR) 20 MG tablet Take 1 tablet (20 mg total) by mouth at bedtime. 90 tablet 3  . carvedilol (COREG) 25 MG tablet TAKE 1 TABLET TWICE DAILY WITH A MEAL. 180 tablet 2  . Cholecalciferol (VITAMIN D-3) 1000 UNITS CAPS Take 1,000 Units by mouth daily.     . indomethacin (INDOCIN SR) 75 MG CR capsule Take 75 mg by mouth daily as needed (gout attacks).    [name] omeprazole (PRILOSEC) 20 MG capsule 1CD 90 capsule 2  . spironolactone (ALDACTONE) 25 MG tablet Take 0.5 tablets (12.5 mg total) by mouth daily. 45 tablet 3  . Tamsulosin HCl (FLOMAX) 0.4 MG CAPS Take 0.4 mg by mouth daily after breakfast.     . amiodarone (PACERONE) 200 MG tablet Take one tablet by mouth daily Monday through Friday.  Do NOT take Saturday or Sunday. 90 tablet 3  . isosorbide mononitrate (IMDUR) 30 MG 24 hr tablet Take 0.5 tablets (15 mg total) by mouth daily. 45 tablet 3   No current facility-administered medications for this visit.      Past Medical History:  Diagnosis Date  . CAD (coronary artery disease)   . CHF (congestive heart failure) ([place])   . Dyslipidemia   . History of ventricular fibrillation   . HTN (hypertension)   . Hx-sudden cardiac arrest   . Ischemic cardiomyopathy   . LBBB (left bundle branch block)   . Ventricular tachycardia ([place])     ROS:   All systems reviewed and negative except as noted in the HPI.   Past Surgical History:  Procedure Laterality Date  . CARDIAC CATHETERIZATION  10/12/2008  . CARDIAC DEFIBRILLATOR PLACEMENT     St Jude  . DOPPLER ECHOCARDIOGRAPHY  2008, 2011  . EP IMPLANTABLE DEVICE N/A 05/19/2014   Procedure: ICD/BIV ICD Generator Changeout;  Surgeon: Evans Lance, MD;  Location: [facility];  Service: Cardiovascular;  Laterality: N/A;  . TONSILECTOMY, ADENOIDECTOMY, BILATERAL MYRINGOTOMY AND TUBES       Family History  Problem Relation Age of Onset  . Coronary artery disease Other      Social History   Socioeconomic History  . Marital status: Divorced    Spouse name: Not on file  . Number of children: Not on file  . Years of education: Not on file  . Highest education level: Not on file  Occupational History  . Occupation: Web designer  Social Needs  . Financial resource strain: Not on file  . Food insecurity    Worry: Not on file    Inability: Not on file  . Transportation needs    Medical: Not on file    Non-medical: Not on file  Tobacco Use  . Smoking status: Former Smoker    Quit date: 01/03/1985    Years since quitting: 33.7  . Smokeless tobacco: Never Used  Substance and Sexual Activity  . Alcohol use: Never    Frequency: Never  . Drug use: Never  . Sexual  activity: Not on file  Lifestyle  . Physical activity    Days per week: Not on file    Minutes per session: Not on file  . Stress: Not on file  Relationships  . Social Herbalist on phone: Not on file    Gets together: Not on file    Attends religious service: Not on file    Active member of club or organization: Not on file    Attends meetings of clubs or organizations: Not on file    Relationship status: Not on file  . Intimate partner violence    Fear of current or ex partner: Not on file    Emotionally abused: Not on file    Physically abused: Not on file    Forced sexual activity: Not on file  Other Topics Concern  . Not on file  Social History Narrative  . Not on file     BP 134/80   Pulse 60   Ht 5\' 7"  (1.702 m)   Wt 197 lb (89.4 kg)   SpO2 98%   BMI 30.85  kg/m   Physical Exam:  Well appearing NAD HEENT: Unremarkable Neck:  No JVD, no thyromegally Lymphatics:  No adenopathy Back:  No CVA tenderness Lungs:  Clear with no wheezes HEART:  Regular rate rhythm, no murmurs, no rubs, no clicks Abd:  soft, positive bowel sounds, no organomegally, no rebound, no guarding Ext:  2 plus pulses, no edema, no cyanosis, no clubbing Skin:  No rashes no nodules Neuro:  CN II through XII intact, motor grossly intact  EKG - nsr with AV pacing  DEVICE  Normal device function.  See PaceArt for details.   Assess/Plan: 1. VT - he has had no recurrent VT. I have asked him to reduce his amiodarone to 1 gm a week. 2. Chronic systolic heart failure - he has class 2 symptoms. I have asked him to increase his physical activity. 3. ICD - his St. Jude CRT-D is working normally. 4. CAD - he denies anginal symptoms. He will continue his current meds.  Mikle Bosworth.D.

## 2018-09-28 NOTE — Patient Instructions (Addendum)
Medication Instructions:  Your physician has recommended you make the following change in your medication:   1.  Reduce your amiodarone 200 mg--Take one tablet by mouth daily Monday through Friday.  Do NOT take on Saturday or Sunday.  Labwork: None ordered.  Testing/Procedures: None ordered.  Follow-Up: Your physician wants you to follow-up in: one year with Dr. Lovena Le.   You will receive a reminder letter in the mail two months in advance. If you don't receive a letter, please call our office to schedule the follow-up appointment.  Remote monitoring is used to monitor your ICD from home. This monitoring reduces the number of office visits required to check your device to one time per year. It allows [name] to keep an eye on the functioning of your device to ensure it is working properly. You are scheduled for a device check from home on 12/11/2018. You may send your transmission at any time that day. If you have a wireless device, the transmission will be sent automatically. After your physician reviews your transmission, you will receive a postcard with your next transmission date.  Any Other Special Instructions Will Be Listed Below (If Applicable).  If you need a refill on your cardiac medications before your next appointment, please call your pharmacy.

## 2018-10-09 ENCOUNTER — Other Ambulatory Visit: Payer: Self-pay | Admitting: Internal Medicine

## 2018-10-15 ENCOUNTER — Other Ambulatory Visit: Payer: Self-pay | Admitting: Internal Medicine

## 2018-10-15 MED ORDER — ISOSORBIDE MONONITRATE ER 30 MG PO TB24: 15.0000 mg | ORAL_TABLET | Freq: Every day | ORAL | 3 refills | Status: DC

## 2018-10-15 MED ORDER — AMIODARONE HCL 200 MG PO TABS: ORAL_TABLET | ORAL | 3 refills | Status: DC

## 2018-10-15 NOTE — Telephone Encounter (Signed)
Pt's medications were sent to pt's pharmacy as requested. Confirmation received.  

## 2018-10-24 DIAGNOSIS — Z23 Encounter for immunization: Secondary | ICD-10-CM | POA: Diagnosis not present

## 2018-11-15 ENCOUNTER — Inpatient Hospital Stay (HOSPITAL_COMMUNITY)
Admission: EM | Admit: 2018-11-15 | Discharge: 2018-11-17 | DRG: 291 | Disposition: A | Discharge status: Home / Self Care | Payer: Medicare Other | Attending: Internal Medicine | Admitting: Internal Medicine

## 2018-11-15 ENCOUNTER — Other Ambulatory Visit: Payer: Self-pay

## 2018-11-15 ENCOUNTER — Emergency Department (HOSPITAL_COMMUNITY): Payer: Medicare Other

## 2018-11-15 ENCOUNTER — Inpatient Hospital Stay (HOSPITAL_COMMUNITY): Payer: Medicare Other

## 2018-11-15 ENCOUNTER — Encounter (HOSPITAL_COMMUNITY): Payer: Self-pay | Admitting: Emergency Medicine

## 2018-11-15 DIAGNOSIS — Z8679 Personal history of other diseases of the circulatory system: Secondary | ICD-10-CM

## 2018-11-15 DIAGNOSIS — I472 Ventricular tachycardia: Secondary | ICD-10-CM | POA: Diagnosis present

## 2018-11-15 DIAGNOSIS — Z6830 Body mass index (BMI) 30.0-30.9, adult: Secondary | ICD-10-CM

## 2018-11-15 DIAGNOSIS — D509 Iron deficiency anemia, unspecified: Secondary | ICD-10-CM

## 2018-11-15 DIAGNOSIS — R0902 Hypoxemia: Secondary | ICD-10-CM | POA: Diagnosis not present

## 2018-11-15 DIAGNOSIS — Z833 Family history of diabetes mellitus: Secondary | ICD-10-CM | POA: Diagnosis not present

## 2018-11-15 DIAGNOSIS — R069 Unspecified abnormalities of breathing: Secondary | ICD-10-CM | POA: Diagnosis not present

## 2018-11-15 DIAGNOSIS — I480 Paroxysmal atrial fibrillation: Secondary | ICD-10-CM | POA: Diagnosis not present

## 2018-11-15 DIAGNOSIS — I509 Heart failure, unspecified: Secondary | ICD-10-CM | POA: Diagnosis not present

## 2018-11-15 DIAGNOSIS — R079 Chest pain, unspecified: Secondary | ICD-10-CM | POA: Diagnosis not present

## 2018-11-15 DIAGNOSIS — Z95810 Presence of automatic (implantable) cardiac defibrillator: Secondary | ICD-10-CM

## 2018-11-15 DIAGNOSIS — Z79899 Other long term (current) drug therapy: Secondary | ICD-10-CM

## 2018-11-15 DIAGNOSIS — Z20828 Contact with and (suspected) exposure to other viral communicable diseases: Secondary | ICD-10-CM | POA: Diagnosis present

## 2018-11-15 DIAGNOSIS — Z823 Family history of stroke: Secondary | ICD-10-CM

## 2018-11-15 DIAGNOSIS — I2510 Atherosclerotic heart disease of native coronary artery without angina pectoris: Secondary | ICD-10-CM | POA: Diagnosis present

## 2018-11-15 DIAGNOSIS — I361 Nonrheumatic tricuspid (valve) insufficiency: Secondary | ICD-10-CM

## 2018-11-15 DIAGNOSIS — R0602 Shortness of breath: Secondary | ICD-10-CM | POA: Diagnosis not present

## 2018-11-15 DIAGNOSIS — Z7982 Long term (current) use of aspirin: Secondary | ICD-10-CM | POA: Diagnosis not present

## 2018-11-15 DIAGNOSIS — I5042 Chronic combined systolic (congestive) and diastolic (congestive) heart failure: Secondary | ICD-10-CM | POA: Insufficient documentation

## 2018-11-15 DIAGNOSIS — I340 Nonrheumatic mitral (valve) insufficiency: Secondary | ICD-10-CM

## 2018-11-15 DIAGNOSIS — I255 Ischemic cardiomyopathy: Secondary | ICD-10-CM | POA: Diagnosis not present

## 2018-11-15 DIAGNOSIS — J9601 Acute respiratory failure with hypoxia: Secondary | ICD-10-CM | POA: Diagnosis not present

## 2018-11-15 DIAGNOSIS — R0789 Other chest pain: Secondary | ICD-10-CM | POA: Diagnosis not present

## 2018-11-15 DIAGNOSIS — M109 Gout, unspecified: Secondary | ICD-10-CM | POA: Diagnosis not present

## 2018-11-15 DIAGNOSIS — Z8674 Personal history of sudden cardiac arrest: Secondary | ICD-10-CM | POA: Diagnosis not present

## 2018-11-15 DIAGNOSIS — Z87891 Personal history of nicotine dependence: Secondary | ICD-10-CM

## 2018-11-15 DIAGNOSIS — I110 Hypertensive heart disease with heart failure: Secondary | ICD-10-CM | POA: Diagnosis not present

## 2018-11-15 DIAGNOSIS — I10 Essential (primary) hypertension: Secondary | ICD-10-CM | POA: Diagnosis present

## 2018-11-15 DIAGNOSIS — Z8249 Family history of ischemic heart disease and other diseases of the circulatory system: Secondary | ICD-10-CM

## 2018-11-15 DIAGNOSIS — I5023 Acute on chronic systolic (congestive) heart failure: Secondary | ICD-10-CM | POA: Diagnosis not present

## 2018-11-15 DIAGNOSIS — Z91120 Patient's intentional underdosing of medication regimen due to financial hardship: Secondary | ICD-10-CM | POA: Diagnosis not present

## 2018-11-15 DIAGNOSIS — I4891 Unspecified atrial fibrillation: Secondary | ICD-10-CM | POA: Diagnosis present

## 2018-11-15 DIAGNOSIS — I5021 Acute systolic (congestive) heart failure: Secondary | ICD-10-CM | POA: Diagnosis not present

## 2018-11-15 DIAGNOSIS — Z8546 Personal history of malignant neoplasm of prostate: Secondary | ICD-10-CM

## 2018-11-15 DIAGNOSIS — I5043 Acute on chronic combined systolic (congestive) and diastolic (congestive) heart failure: Secondary | ICD-10-CM | POA: Diagnosis present

## 2018-11-15 DIAGNOSIS — I4901 Ventricular fibrillation: Secondary | ICD-10-CM | POA: Diagnosis not present

## 2018-11-15 LAB — CBC WITH DIFFERENTIAL/PLATELET
Abs Immature Granulocytes: 0.05 10*3/uL (ref 0.00–0.07)
Basophils Absolute: 0 10*3/uL (ref 0.0–0.1)
Basophils Relative: 1 %
Eosinophils Absolute: 0 10*3/uL (ref 0.0–0.5)
Eosinophils Relative: 0 %
HCT: 37.7 % — ABNORMAL LOW (ref 39.0–52.0)
Hemoglobin: 11.6 g/dL — ABNORMAL LOW (ref 13.0–17.0)
Immature Granulocytes: 1 %
Lymphocytes Relative: 7 %
Lymphs Abs: 0.5 10*3/uL — ABNORMAL LOW (ref 0.7–4.0)
MCH: 27.8 pg (ref 26.0–34.0)
MCHC: 30.8 g/dL (ref 30.0–36.0)
MCV: 90.4 fL (ref 80.0–100.0)
Monocytes Absolute: 0.4 10*3/uL (ref 0.1–1.0)
Monocytes Relative: 6 %
Neutro Abs: 6.7 10*3/uL (ref 1.7–7.7)
Neutrophils Relative %: 85 %
Platelets: 155 10*3/uL (ref 150–400)
RBC: 4.17 MIL/uL — ABNORMAL LOW (ref 4.22–5.81)
RDW: 14.8 % (ref 11.5–15.5)
WBC: 7.7 10*3/uL (ref 4.0–10.5)
nRBC: 0 % (ref 0.0–0.2)

## 2018-11-15 LAB — COMPREHENSIVE METABOLIC PANEL
ALT: 22 U/L (ref 0–44)
AST: 23 U/L (ref 15–41)
Albumin: 3.5 g/dL (ref 3.5–5.0)
Alkaline Phosphatase: 63 U/L (ref 38–126)
Anion gap: 13 (ref 5–15)
BUN: 18 mg/dL (ref 8–23)
CO2: 27 mmol/L (ref 22–32)
Calcium: 8.7 mg/dL — ABNORMAL LOW (ref 8.9–10.3)
Chloride: 104 mmol/L (ref 98–111)
Creatinine, Ser: 1.2 mg/dL (ref 0.61–1.24)
GFR calc Af Amer: >60 mL/min (ref >60)
GFR calc non Af Amer: 55 mL/min — ABNORMAL LOW (ref >60)
Glucose, Bld: 168 mg/dL — ABNORMAL HIGH (ref 70–99)
Potassium: 3.8 mmol/L (ref 3.5–5.1)
Sodium: 144 mmol/L (ref 135–145)
Total Bilirubin: 0.8 mg/dL (ref 0.3–1.2)
Total Protein: 6.1 g/dL — ABNORMAL LOW (ref 6.5–8.1)

## 2018-11-15 LAB — ECHOCARDIOGRAM COMPLETE: Weight: 3120 oz

## 2018-11-15 LAB — MAGNESIUM: Magnesium: 1.8 mg/dL (ref 1.7–2.4)

## 2018-11-15 LAB — POTASSIUM: Potassium: 3.7 mmol/L (ref 3.5–5.1)

## 2018-11-15 LAB — TSH: TSH: 1.581 u[IU]/mL (ref 0.350–4.500)

## 2018-11-15 LAB — TROPONIN I (HIGH SENSITIVITY)
Troponin I (High Sensitivity): 23 ng/L — ABNORMAL HIGH (ref <18)
Troponin I (High Sensitivity): 33 ng/L — ABNORMAL HIGH (ref <18)
Troponin I (High Sensitivity): 41 ng/L — ABNORMAL HIGH (ref <18)
Troponin I (High Sensitivity): 38 ng/L — ABNORMAL HIGH (ref <18)

## 2018-11-15 LAB — IRON AND TIBC
Iron: 33 ug/dL — ABNORMAL LOW (ref 45–182)
Saturation Ratios: 8 % — ABNORMAL LOW (ref 17.9–39.5)
TIBC: 395 ug/dL (ref 250–450)
UIBC: 362 ug/dL

## 2018-11-15 LAB — PHOSPHORUS: Phosphorus: 2.8 mg/dL (ref 2.5–4.6)

## 2018-11-15 LAB — T4, FREE: Free T4: 1.32 ng/dL — ABNORMAL HIGH (ref 0.61–1.12)

## 2018-11-15 LAB — FERRITIN: Ferritin: 55 ng/mL (ref 24–336)

## 2018-11-15 LAB — BRAIN NATRIURETIC PEPTIDE: B Natriuretic Peptide: 725.7 pg/mL — ABNORMAL HIGH (ref 0.0–100.0)

## 2018-11-15 LAB — SARS CORONAVIRUS 2 (TAT 6-24 HRS): SARS Coronavirus 2: NEGATIVE

## 2018-11-15 MED ORDER — MAGNESIUM SULFATE 2 GM/50ML IV SOLN
2.0000 g | Freq: Once | INTRAVENOUS | Status: AC
  Administered 2018-11-15: 2 g via INTRAVENOUS
  Filled 2018-11-15: qty 50

## 2018-11-15 MED ORDER — RAMELTEON 8 MG PO TABS
8.0000 mg | ORAL_TABLET | Freq: Every day | ORAL | Status: DC
  Administered 2018-11-15 – 2018-11-17 (×2): 8 mg via ORAL
  Filled 2018-11-15 (×5): qty 1

## 2018-11-15 MED ORDER — SPIRONOLACTONE 12.5 MG HALF TABLET
12.5000 mg | ORAL_TABLET | Freq: Every day | ORAL | Status: DC
  Administered 2018-11-15 – 2018-11-17 (×3): 12.5 mg via ORAL
  Filled 2018-11-15 (×3): qty 1

## 2018-11-15 MED ORDER — LIVING BETTER WITH HEART FAILURE BOOK
Freq: Once | Status: AC
  Administered 2018-11-15: 18:00:00

## 2018-11-15 MED ORDER — PROMETHAZINE HCL 25 MG PO TABS: 12.5000 mg | ORAL_TABLET | Freq: Four times a day (QID) | ORAL | Status: DC | PRN

## 2018-11-15 MED ORDER — ASPIRIN EC 81 MG PO TBEC
81.0000 mg | DELAYED_RELEASE_TABLET | Freq: Every day | ORAL | Status: DC
  Administered 2018-11-15 – 2018-11-17 (×3): 81 mg via ORAL
  Filled 2018-11-15 (×3): qty 1

## 2018-11-15 MED ORDER — POTASSIUM CHLORIDE CRYS ER 20 MEQ PO TBCR
30.0000 meq | EXTENDED_RELEASE_TABLET | Freq: Four times a day (QID) | ORAL | Status: AC
  Administered 2018-11-15 – 2018-11-16 (×2): 30 meq via ORAL
  Filled 2018-11-15 (×2): qty 1

## 2018-11-15 MED ORDER — CARVEDILOL 25 MG PO TABS
25.0000 mg | ORAL_TABLET | Freq: Two times a day (BID) | ORAL | Status: DC
  Administered 2018-11-15 – 2018-11-17 (×4): 25 mg via ORAL
  Filled 2018-11-15 (×4): qty 1

## 2018-11-15 MED ORDER — VITAMIN D 25 MCG (1000 UNIT) PO TABS
1000.0000 [IU] | ORAL_TABLET | Freq: Every day | ORAL | Status: DC
  Administered 2018-11-15 – 2018-11-17 (×3): 1000 [IU] via ORAL
  Filled 2018-11-15 (×3): qty 1

## 2018-11-15 MED ORDER — ALUM & MAG HYDROXIDE-SIMETH 200-200-20 MG/5ML PO SUSP
15.0000 mL | Freq: Once | ORAL | Status: AC
  Administered 2018-11-15: 09:00:00 15 mL via ORAL
  Filled 2018-11-15: qty 30

## 2018-11-15 MED ORDER — FUROSEMIDE 10 MG/ML IJ SOLN
80.0000 mg | Freq: Once | INTRAMUSCULAR | Status: AC
  Administered 2018-11-15: 09:00:00 80 mg via INTRAVENOUS
  Filled 2018-11-15: qty 8

## 2018-11-15 MED ORDER — ISOSORBIDE MONONITRATE ER 30 MG PO TB24
15.0000 mg | ORAL_TABLET | Freq: Every day | ORAL | Status: DC
  Administered 2018-11-15 – 2018-11-17 (×3): 15 mg via ORAL
  Filled 2018-11-15 (×3): qty 1

## 2018-11-15 MED ORDER — AMIODARONE HCL 200 MG PO TABS
200.0000 mg | ORAL_TABLET | Freq: Two times a day (BID) | ORAL | Status: DC
  Administered 2018-11-15 – 2018-11-17 (×4): 200 mg via ORAL
  Filled 2018-11-15 (×4): qty 1

## 2018-11-15 MED ORDER — SENNOSIDES-DOCUSATE SODIUM 8.6-50 MG PO TABS: 1.0000 | ORAL_TABLET | Freq: Every evening | ORAL | Status: DC | PRN

## 2018-11-15 MED ORDER — AMIODARONE HCL 200 MG PO TABS: 400.0000 mg | ORAL_TABLET | ORAL | Status: DC

## 2018-11-15 MED ORDER — TAMSULOSIN HCL 0.4 MG PO CAPS
0.4000 mg | ORAL_CAPSULE | Freq: Every day | ORAL | Status: DC
  Administered 2018-11-15 – 2018-11-17 (×3): 0.4 mg via ORAL
  Filled 2018-11-15 (×3): qty 1

## 2018-11-15 MED ORDER — ENOXAPARIN SODIUM 40 MG/0.4ML ~~LOC~~ SOLN
40.0000 mg | SUBCUTANEOUS | Status: DC
  Administered 2018-11-15 – 2018-11-17 (×3): 40 mg via SUBCUTANEOUS
  Filled 2018-11-15 (×3): qty 0.4

## 2018-11-15 MED ORDER — AMIODARONE HCL 200 MG PO TABS
200.0000 mg | ORAL_TABLET | ORAL | Status: DC
  Administered 2018-11-15: 12:00:00 200 mg via ORAL
  Filled 2018-11-15: qty 1

## 2018-11-15 MED ORDER — PERFLUTREN LIPID MICROSPHERE
1.0000 mL | INTRAVENOUS | Status: AC | PRN
  Administered 2018-11-15: 6 mL via INTRAVENOUS
  Filled 2018-11-15: qty 10

## 2018-11-15 MED ORDER — ACETAMINOPHEN 650 MG RE SUPP: 650.0000 mg | Freq: Four times a day (QID) | RECTAL | Status: DC | PRN

## 2018-11-15 MED ORDER — ATORVASTATIN CALCIUM 10 MG PO TABS
20.0000 mg | ORAL_TABLET | Freq: Every day | ORAL | Status: DC
  Administered 2018-11-15 – 2018-11-16 (×2): 20 mg via ORAL
  Filled 2018-11-15 (×2): qty 2

## 2018-11-15 MED ORDER — ACETAMINOPHEN 325 MG PO TABS: 650.0000 mg | ORAL_TABLET | Freq: Four times a day (QID) | ORAL | Status: DC | PRN

## 2018-11-15 NOTE — ED Provider Notes (Signed)
Blood pressure (!) 148/72, pulse (!) 59, temperature 98.7 F (37.1 C), temperature source Oral, resp. rate 19, weight 88.5 kg, SpO2 95 %.  Assuming care from Dr. Dayna Barker.  In short, Mark Nguyen is a 85 y.o. male with a chief complaint of Shortness of Breath .  Refer to the original H&P for additional details.  The current plan of care is to f/u on labs.  Patient's lab work is significant for elevation in BNP and mild elevation in troponin.  He has bilateral pitting edema which is trace to 1+.  Some mild wheezing on exam but mostly crackles at the bases on my exam.  He is continuing to require 3 L nasal cannula which is new for him.  No fevers, GI symptoms, or other strong suspicion to suspect COVID-19.  He does describe some burning epigastric discomfort and I do plan to give Maalox for this.  His LFTs and bilirubin are normal.  Plan for admission.   EKG Interpretation  Date/Time:  Thursday November 15 2018 05:32:27 EST Ventricular Rate:  60 PR Interval:    QRS Duration: 169 QT Interval:  530 QTC Calculation: 530 R Axis:   19 Text Interpretation: posterior ecg paced rhythm. no significant ST-E Confirmed by Merrily Pew [phone]) on 11/15/2018 5:50:28 AM        Discussed patient's case with Teaching Service to request admission. Patient and family (if present) updated with plan. Care transferred to Medicine service.  I reviewed all nursing notes, vitals, pertinent old records, EKGs, labs, imaging (as available).    Margette Fast, MD 11/15/18 [phone]

## 2018-11-15 NOTE — Progress Notes (Signed)
HFrEF: Reviewed ECHO decreased EF compared to prior from 2011 and new akinetic inferior wall suggestive of prior inferior infarct.  Troponin flat x4, history/exam and ecg (albeit difficult to interpret given pacer/LBBB) but not suggestive of acute acs.  Still markedly volume overloaded. Discussed with pt repeating lasix but pt has been without sleep for some time would like to delay dosing until tomorrow as respiratory status has improved enough to sleep.    -no pm lasix dose  V tach: Reviewed telemetry pt with DDDR st jude BIV rate abruptly increased to around 120 for about one minute and 20 seconds earlier this evening.  ICD likely did not fire due to slow rate.  At that time pt reported to be asymptomatic.  Pt not exerting himself at the time and although pacemaker is DDDR felt to be more likely [place]. Discussed with cardiology fellow on call.     -replacing electrolytes -increase amiodarone to 200mg  BID -continue to monitor

## 2018-11-15 NOTE — ED Provider Notes (Signed)
Emergency Department Provider Note   I have reviewed the triage vital signs and the nursing notes.   HISTORY  Chief Complaint Shortness of Breath   HPI Mark Nguyen is a 85 y.o. male with significant cardiac history to include coronary artery disease, CHF with EF less than 30%, hypertension, bundle branch block, ischemic cardiomyopathy status post pacemaker placement the presents the emerge department today with shortness of breath.  Patient states over the last 3 or 4 days of progressively worsening dyspnea and slight chest congestion.  He had a dry cough but not too much of one.  He also has not had a fever.  No sick contacts.  Patient without any chest pain.  States is worse when he lays flat or moves too much.   Apparently was significantly diminished on EMS arrival with a sat of 78% on room air at rest.  Was given breathing treatments and steroids prior to arrival patient states that this seems to help him.  EMS states that he started having wheezing after these treatments.  No other associated or modifying symptoms.    Past Medical History:  Diagnosis Date  . CAD (coronary artery disease)   . CHF (congestive heart failure) ([place])   . Dyslipidemia   . History of ventricular fibrillation   . HTN (hypertension)   . Hx-sudden cardiac arrest   . Ischemic cardiomyopathy   . LBBB (left bundle branch block)   . Ventricular tachycardia [facility])     Patient Active Problem List   Diagnosis Date Noted  . Atrial fibrillation ([place]) 01/18/2011  . CARDIOVASCULAR FUNCTION STUDY, ABNORMAL 10/29/2008  . SYSTOLIC HEART FAILURE, CHRONIC 06/09/2008  . DYSLIPIDEMIA 03/25/2008  . HYPERTENSION, UNSPECIFIED 03/25/2008  . CAD, UNSPECIFIED SITE 03/25/2008  . ISCHEMIC CARDIOMYOPATHY 03/25/2008  . LBBB 03/25/2008  . VENTRICULAR TACHYCARDIA 03/25/2008  . VENTRICULAR FIBRILLATION 03/25/2008  . PERSONAL HISTORY OF SUDDEN CARDIAC ARREST 03/25/2008  . Automatic implantable cardioverter-defibrillator  in situ 03/25/2008    Past Surgical History:  Procedure Laterality Date  . CARDIAC CATHETERIZATION  10/12/2008  . CARDIAC DEFIBRILLATOR PLACEMENT     [employer]  . DOPPLER ECHOCARDIOGRAPHY  2008, 2011  . EP IMPLANTABLE DEVICE N/A 05/19/2014   Procedure: ICD/BIV ICD Generator Changeout;  Surgeon: Evans Lance, MD;  Location: [place] CV LAB;  Service: Cardiovascular;  Laterality: N/A;  . TONSILECTOMY, ADENOIDECTOMY, BILATERAL MYRINGOTOMY AND TUBES      Current Outpatient Rx  . Order #: [id number] Class: Normal  . Order #: [id number] Class: No Print  . Order #: [id number] Class: Normal  . Order #: [id number] Class: Normal  . Order #: [id number] Class: Historical Med  . Order #: [id number] Class: Historical Med  . Order #: [id number] Class: Normal  . Order #: [id number] Class: Normal  . Order #: [id number] Class: Normal  . Order #: [id number] Class: Historical Med    Allergies Patient has no known allergies.  Family History  Problem Relation Age of Onset  . Coronary artery disease Other     Social History Social History   Tobacco Use  . Smoking status: Former Smoker    Quit date: 01/03/1985    Years since quitting: 33.8  . Smokeless tobacco: Never Used  Substance Use Topics  . Alcohol use: Never    Frequency: Never  . Drug use: Never    Review of Systems  All other systems negative except as documented in the HPI. All pertinent positives and negatives as reviewed in the HPI. ____________________________________________   PHYSICAL EXAM:  VITAL SIGNS: ED Triage  Vitals  Enc Vitals Group     BP 11/15/18 0523 137/68     Pulse Rate 11/15/18 0522 67     Resp 11/15/18 0522 18     Temp 11/15/18 0523 98.7 F (37.1 C)     Temp Source 11/15/18 0523 Oral     SpO2 11/15/18 0522 95 %     Weight 11/15/18 0515 195 lb (88.5 kg)    Constitutional: Alert and oriented. Well appearing and in no acute distress. Eyes: Conjunctivae are normal. PERRL. EOMI. Head: Atraumatic. Nose: No  congestion/rhinnorhea. Mouth/Throat: Mucous membranes are moist.  Oropharynx non-erythematous. Neck: No stridor.  No meningeal signs.   Cardiovascular: Normal rate, regular rhythm. Good peripheral circulation. Grossly normal heart sounds.   Respiratory: Tachypneic respiratory effort.  No retractions. Lungs with crackles, wheezing. Gastrointestinal: Soft and nontender. No distention.  Musculoskeletal: No lower extremity tenderness nor edema. No gross deformities of extremities. Neurologic:  Normal speech and language. No gross focal neurologic deficits are appreciated.  Skin:  Skin is warm, dry and intact. No rash noted.  ____________________________________________   LABS (all labs ordered are listed, but only abnormal results are displayed)  Labs Reviewed  CBC WITH DIFFERENTIAL/PLATELET  COMPREHENSIVE METABOLIC PANEL  BRAIN NATRIURETIC PEPTIDE  TROPONIN I (HIGH SENSITIVITY)   ____________________________________________  EKG   EKG Interpretation  Date/Time:  Thursday November 15 2018 05:23:53 EST Ventricular Rate:  61 PR Interval:    QRS Duration: 178 QT Interval:  467 QTC Calculation: 471 R Axis:   -102 Text Interpretation: likely paced rhythm with associated repol changes Confirmed by Merrily Pew [phone]) on 11/15/2018 5:32:04 AM       EKG Interpretation  Date/Time:  Thursday November 15 2018 05:32:27 EST Ventricular Rate:  60 PR Interval:    QRS Duration: 169 QT Interval:  530 QTC Calculation: 530 R Axis:   19 Text Interpretation: posterior ecg paced rhythm. no significant ST-E Confirmed by Merrily Pew [phone]) on 11/15/2018 5:50:28 AM        ____________________________________________  RADIOLOGY  No results found.  ____________________________________________   PROCEDURES  Procedure(s) performed:   Procedures  CRITICAL CARE Performed by: Merrily Pew Total critical care time: 35 minutes Critical care time was exclusive of separately  billable procedures and treating other patients. Critical care was necessary to treat or prevent imminent or life-threatening deterioration. Critical care was time spent personally by me on the following activities: development of treatment plan with patient and/or surrogate as well as nursing, discussions with consultants, evaluation of patient's response to treatment, examination of patient, obtaining history from patient or surrogate, ordering and performing treatments and interventions, ordering and review of laboratory studies, ordering and review of radiographic studies, pulse oximetry and re-evaluation of patient's condition.   ____________________________________________   INITIAL IMPRESSION / ASSESSMENT AND PLAN / ED COURSE  Does not have a history of COPD and does not have significant wheezing but does have a history of smoking.  Also consider possible wheezing secondary to pulmonary edema more likely since he has a long cardiac history.  With the amount of hypoxia that he had and requiring oxygen to keep his saturations above 90% I suspect he will need to be admitted to the hospital but will check labs and initiate treatments here first.  Care transferred pending workup and reevaluation with likely admission.      Pertinent labs & imaging results that were available during my care of the patient were reviewed by me and considered in my medical decision making (see  chart for details).  ____________________________________________  FINAL CLINICAL IMPRESSION(S) / ED DIAGNOSES  Final diagnoses:  None     MEDICATIONS GIVEN DURING THIS VISIT:  Medications - No data to display   NEW OUTPATIENT MEDICATIONS STARTED DURING THIS VISIT:  New Prescriptions   No medications on file    Note:  This note was prepared with assistance of Dragon voice recognition software. Occasional wrong-word or sound-a-like substitutions may have occurred due to the inherent limitations of voice  recognition software.   Aslin Farinas, Corene Cornea, MD 11/17/18 [phone]

## 2018-11-15 NOTE — Plan of Care (Signed)
  Problem: Clinical Measurements: Goal: Ability to maintain clinical measurements within normal limits will improve Outcome: Progressing   Problem: Clinical Measurements: Goal: Respiratory complications will improve Outcome: Progressing   Problem: Clinical Measurements: Goal: Cardiovascular complication will be avoided Outcome: Progressing   

## 2018-11-15 NOTE — ED Notes (Signed)
Pacemaker interrogated Per Mickel Baas from [facility], only concerns was for "Fluid build-up"

## 2018-11-15 NOTE — ED Notes (Signed)
[place] pts sister-in-law

## 2018-11-15 NOTE — ED Notes (Signed)
ECHO in progess

## 2018-11-15 NOTE — ED Triage Notes (Addendum)
Pt arrives to ED from home with complaints of shortness of breath that has continued to get worse for the last three to four days. Patient states that he called EMS because he cannot lay down flat without getting severely SOB. EMS reports patient was found to be 78% on room air at home. EMS gave patient 5mg  albuterol, 0.5mg  atrovent, and 125mg  solumedrol. On initial auscultation of patients lungs he was very diminished without much movement now after the breathing treatments he has audible wheezing in both lung fields. Patient currently on 4L [place] saturating at 96%.

## 2018-11-15 NOTE — Progress Notes (Signed)
Pt had a 120 beat run of what appeared to be underlying rhythm of v-tach. Patient is a-v paced. Assessed patient, no shortness of breath, chest pain, or dizziness. Will obtain vitals and continue to monitor. MD paged.

## 2018-11-15 NOTE — Progress Notes (Addendum)
Went to bedside to examine patient and reviewed telemetry monitor after receiving a page from nursing staff regarding episodes of V. Tach.  On review of patient's telemetry monitor, he had to separate episodes of V. tach/V. fib and counted >100 beats.  On assessment of the patient, he denied any symptoms whatsoever.  He did not report of chest pain, shortness of breath, dizziness, lightheadedness or presyncope.  His vitals did remain unremarkable which seems quite unusual  He does have a history of VT and is now status post ICD placement.  Per chart review, he recently saw his cardiologist Dr. Lovena Le on September 28, 2018 where his amiodarone dose was reduced to 1 g/week.  Might consider reaching out to cardiology to ensure uptitrating amiodarone to original dose   Plan: -Follow-up magnesium, phosphorus and potassium -Continue to monitor

## 2018-11-15 NOTE — Progress Notes (Signed)
Echocardiogram 2D Echocardiogram has been performed.  Mark Nguyen 11/15/2018, 9:50 AM   Alerted Dr. Audie Box at 9:50

## 2018-11-15 NOTE — H&P (Addendum)
Date: 11/15/2018               Patient Name:  Mark Nguyen MRN: [id number]  DOB: Oct 11, 1931 Age / Sex: 85 y.o., male   PCP: Patient, No Pcp Per         Medical Service: Internal Medicine Teaching Service         Attending Physician: Dr. Lucious Groves, DO    First Contact: Mark Nguyen  Pager: [phone]  Second Contact: Mark Nguyen Pager: [phone]       After Hours (After 5p/  First Contact Pager: [phone]  weekends / holidays): Second Contact Pager: [phone]   Chief Complaint: Shortness of breath   History of Present Illness:  Mark Nguyen is a 85 y.o male with hfref, history of V. fib s/p st jude biv ICD placement in 2016, lbbb, A. fib, hypertension, CAD, prostate cancer who presented with 4 day history of dyspnea that is worse when he lays down flat and after he walks 150 ft.  He states that he has accompanied abdominal discomfort of gurgling and burning sensation all over abdomen.  He was not able to answer whether his abdomen is more distended than usual.  He does not check his weight regularly does not know if his clothes are fitting more tightly.  He states that he cooks with salt to season his fish.  Denies chest pain, nausea vomiting, fever, chills, sick contacts.  According to EMS the patient was found to have a saturation of 78% on room air at home. He was found to have diminished air movement throughout lungs. He was given 4L [place], 0.5mg  atrovent, 125mg  solumedrol, and 5mg  albuterol.   Patient follows with Mark Nguyen at [facility]..  His last visit was in September 2020 at which time Mark Nguyen advised the patient to reduce his amiodarone dose to 1 g/week-(amiodarone 200 mg Monday through Friday and no medication on Saturday or Sunday), recommended increasing his physical activity, reviewed his pacemaker and stated that he was working normally.  In the ED, he is afebrile, pulse 50-60s, respirations 19-20s, mildly hypertensive 130-150s/70-80s, 90% on 4L [place].   Meds:   Current Meds  Medication Sig  . amiodarone (PACERONE) 200 MG tablet Take one tablet by mouth daily Monday through Friday.  Do NOT take Saturday or Sunday. (Patient taking differently: Take 200 mg by mouth See admin instructions. Take one tablet by mouth daily Monday through Friday.  Do NOT take Saturday or Sunday.)  . aspirin EC 81 MG tablet Take 1 tablet (81 mg total) by mouth daily.  [name] atorvastatin (LIPITOR) 20 MG tablet Take 1 tablet (20 mg total) by mouth at bedtime.  . carvedilol (COREG) 25 MG tablet TAKE 1 TABLET TWICE DAILY WITH A MEAL. (Patient taking differently: Take 25 mg by mouth 2 (two) times daily with a meal. )  . Cholecalciferol (VITAMIN D-3) 1000 UNITS CAPS Take 1,000 Units by mouth daily.   . indomethacin (INDOCIN SR) 75 MG CR capsule Take 75 mg by mouth daily as needed (gout attacks).  . isosorbide mononitrate (IMDUR) 30 MG 24 hr tablet Take 0.5 tablets (15 mg total) by mouth daily.  [name] omeprazole (PRILOSEC) 20 MG capsule 1CD (Patient taking differently: Take 20 mg by mouth daily. )  . spironolactone (ALDACTONE) 25 MG tablet Take 0.5 tablets (12.5 mg total) by mouth daily.  . Tamsulosin HCl (FLOMAX) 0.4 MG CAPS Take 0.4 mg by mouth daily after breakfast.  Allergies: Allergies as of 11/15/2018  . (No Known Allergies)   Past Medical History:  Diagnosis Date  . CAD (coronary artery disease)   . CHF (congestive heart failure) ([place])   . Dyslipidemia   . History of ventricular fibrillation   . HTN (hypertension)   . Hx-sudden cardiac arrest   . Ischemic cardiomyopathy   . LBBB (left bundle branch block)   . Ventricular tachycardia ([facility])     Family History:   Father had a CVA Sister has diabetes  Social History:   Lives alone in [place] Retired used to be a Administrator Used to smoke 1 pack/day for 30 years quit 30 years ago Drinks beer occasionally Denies any drug use   Review of Systems: A complete ROS was negative except as per HPI. As above   Physical Exam: Blood pressure (!) 158/77, pulse 71, temperature 98.7 F (37.1 C), temperature source Oral, resp. rate (!) 23, weight 88.5 kg, SpO2 96 %.  Physical Exam  Constitutional: He is oriented to person, place, and time. He appears well-developed and well-nourished. No distress.  Obese body habitus  HENT:  Head: Normocephalic and atraumatic.  mallampati 1  Eyes: Conjunctivae are normal.  Cardiovascular: Normal rate, regular rhythm and normal heart sounds.  No murmur heard. jvp difficult to assess due to body habitus  Respiratory: Breath sounds normal. Accessory muscle usage present. He is in respiratory distress. He has no decreased breath sounds. He has no wheezes.  GI: Soft. Bowel sounds are normal. He exhibits distension. There is no abdominal tenderness.  Musculoskeletal:        General: No edema (no pitting).  Neurological: He is alert and oriented to person, place, and time.  Skin: He is not diaphoretic.  Psychiatric: He has a normal mood and affect. His behavior is normal. Judgment and thought content normal.   Labs:  CBC: WBC 7.7, Hb 11.6, HCT 37.7, PLT 155 CMP: NA 144, K3.9, bicarb 27, CR 1.3, AST 23, ALT 22, ALP 63, T bili 0.8, AG 13 BNP: 725 Troponin 23 COVID-19 panel  EKG: personally reviewed my interpretation is normal sinus rhythm, ST depression in v4 and v5 leads  CXR: personally reviewed my interpretation is diffuse interstitial edema, bilateral pleural effusions left>right  Assessment & Plan by Problem: Active Problems:   Acute on chronic heart failure [facility])  [name] is a 85 year old male with HFrEF, hypertension, history of V. fib s/p BiV ICD, LBBB who presents with a 4-day history of dyspnea.  Acute hypoxic respiratory failure HFrEF exacerbation Patient's acute hypoxic respiratory failure is likely secondary to decompensated hfref.  Patient does not have a recent echo on file, last from 2011.  Patient appears volume overloaded on exam.  He  appears in discomfort with need for assistance of accessory respiratory muscles.  No pitting edema and difficult to visualize JVP due to body habitus.  BNP in the 700s.  Chest x-ray showing bilateral pleural effusions and diffuse interstitial edema.  The patient received 80 mg IV Lasix in the ED.  It is unclear what caused the patient's acute decompensation.  Patient's troponin is not significantly elevated and he does not endorse any chest pain.  Furthermore her EKG does not show any ischemic changes.  Will follow up on echo for any wall motion abnormalities and trend troponin.  Patient has had some decreased hemoglobin of 11.6 which is decreased from his baseline of 12-13.  RDW remains within normal range.  We will check iron studies.  We  will keep on cardiac monitor to evaluate for any arrhythmias and interrogate the pacemaker.  Patient states that he takes his medication fairly regularly but he does use salt in his diet which may contribute to his heart failure exacerbation.  He does not have any changes in renal function.  His blood pressure is only mildly elevated. We will check TSH and free T4 for comprehensive work-up. Although patient has a mallampati score of 1, he is morbidly obese, and has stop bang score of 3 which causes concern for OSA.   Patient takes amiodarone 200 mg Monday through Friday, carvedilol 25 mg twice daily, Imdur 15 mg daily, spironolactone 12.5 mg daily.  -Continue carvedilol 25 mg twice daily and spironolactone 12.5 mg daily -Monitor ins and outs closely  -Continue tamsulosin to ensure that the patient has good urine output -Continue IV Lasix 80 mg daily based on output -TTE pending -Trend troponin -Pending iron studies -Pending TSH and free T4 -Cardiac monitoring -Continuous pulse ox -Interrogate pacemaker -EKG in a.m. of 11/16/2018 -Consider cardiology consult if patient symptoms continue to not improve -Heart healthy diet with 1500 milliliter fluid restriction  -outpatient sleep study  CAD Patient had a exercise stress test done on January 2020 during which time he had arm pain the test was negative for ischemia.  -Continue Imdur 15 mg daily -Continue aspirin 81 g daily -Continue atorvastatin 20 mg nightly -Lipid panel in the a.m. 11/16/2018  Atrial fibrillation The patient is in normal sinus rhythm at this time .  His pulse is within normal limits.  -Continue amiodarone 200 mg Monday through Friday -Continue carvedilol 25 mg twice daily -Keep on cardiac monitoring  Abdominal discomfort The patient states that he has been having some generalized abdominal discomfort with burning sensation and tightness. Likely secondary to pud vs volume in abdomen. He was given Maalox and lasix in the emergency room.  We will need to monitor this.  There function enzymes, T bilirubin, hemoglobin within normal limits.  Hypertension The patient's blood pressure since admission has ranged 120-150/70s-80s.    -Continue spironolactone 12.5 mg daily -Continue carvedilol 25 mg twice daily  Gout The patient states that he usually has gout flares in his wrists or elbows.  His left wrist appears erythematous but is nontender.   -Indomethacin on as-needed basis.  Dispo: Admit patient to Inpatient with expected length of stay greater than 2 midnights.  SignedLars Mage, MD 11/15/2018, 9:38 AM  Pager: [phone]

## 2018-11-15 NOTE — Progress Notes (Signed)
MD has returned page regarding 120 beat run of underlying v-tach. She will come assess patient and order his atorvostatin.

## 2018-11-15 NOTE — ED Notes (Signed)
Pt given turkey sandwich, crackers, and water 

## 2018-11-16 DIAGNOSIS — J9601 Acute respiratory failure with hypoxia: Secondary | ICD-10-CM | POA: Diagnosis present

## 2018-11-16 DIAGNOSIS — Z95810 Presence of automatic (implantable) cardiac defibrillator: Secondary | ICD-10-CM

## 2018-11-16 DIAGNOSIS — I255 Ischemic cardiomyopathy: Secondary | ICD-10-CM

## 2018-11-16 DIAGNOSIS — I4891 Unspecified atrial fibrillation: Secondary | ICD-10-CM

## 2018-11-16 DIAGNOSIS — Z91120 Patient's intentional underdosing of medication regimen due to financial hardship: Secondary | ICD-10-CM

## 2018-11-16 DIAGNOSIS — I110 Hypertensive heart disease with heart failure: Principal | ICD-10-CM

## 2018-11-16 DIAGNOSIS — Z79899 Other long term (current) drug therapy: Secondary | ICD-10-CM

## 2018-11-16 DIAGNOSIS — I4901 Ventricular fibrillation: Secondary | ICD-10-CM

## 2018-11-16 DIAGNOSIS — D509 Iron deficiency anemia, unspecified: Secondary | ICD-10-CM

## 2018-11-16 DIAGNOSIS — I5023 Acute on chronic systolic (congestive) heart failure: Secondary | ICD-10-CM

## 2018-11-16 LAB — LIPID PANEL
Cholesterol: 149 mg/dL (ref 0–200)
HDL: 41 mg/dL (ref >40)
LDL Cholesterol: 88 mg/dL (ref 0–99)
Total CHOL/HDL Ratio: 3.6 RATIO
Triglycerides: 101 mg/dL (ref <150)
VLDL: 20 mg/dL (ref 0–40)

## 2018-11-16 LAB — CBC
HCT: 36.4 % — ABNORMAL LOW (ref 39.0–52.0)
Hemoglobin: 11.6 g/dL — ABNORMAL LOW (ref 13.0–17.0)
MCH: 28.3 pg (ref 26.0–34.0)
MCHC: 31.9 g/dL (ref 30.0–36.0)
MCV: 88.8 fL (ref 80.0–100.0)
Platelets: 205 10*3/uL (ref 150–400)
RBC: 4.1 MIL/uL — ABNORMAL LOW (ref 4.22–5.81)
RDW: 14.9 % (ref 11.5–15.5)
WBC: 10.1 10*3/uL (ref 4.0–10.5)
nRBC: 0 % (ref 0.0–0.2)

## 2018-11-16 LAB — BASIC METABOLIC PANEL
Anion gap: 14 (ref 5–15)
BUN: 23 mg/dL (ref 8–23)
CO2: 27 mmol/L (ref 22–32)
Calcium: 8.9 mg/dL (ref 8.9–10.3)
Chloride: 100 mmol/L (ref 98–111)
Creatinine, Ser: 1.39 mg/dL — ABNORMAL HIGH (ref 0.61–1.24)
GFR calc Af Amer: 53 mL/min — ABNORMAL LOW (ref >60)
GFR calc non Af Amer: 46 mL/min — ABNORMAL LOW (ref >60)
Glucose, Bld: 126 mg/dL — ABNORMAL HIGH (ref 70–99)
Potassium: 4.8 mmol/L (ref 3.5–5.1)
Sodium: 141 mmol/L (ref 135–145)

## 2018-11-16 MED ORDER — FUROSEMIDE 10 MG/ML IJ SOLN
80.0000 mg | Freq: Once | INTRAMUSCULAR | Status: AC
  Administered 2018-11-16: 11:00:00 80 mg via INTRAVENOUS
  Filled 2018-11-16: qty 8

## 2018-11-16 MED ORDER — SODIUM CHLORIDE 0.9 % IV SOLN
510.0000 mg | Freq: Once | INTRAVENOUS | Status: DC
  Filled 2018-11-16: qty 17

## 2018-11-16 NOTE — Progress Notes (Signed)
SATURATION QUALIFICATIONS: (This note is used to comply with regulatory documentation for home oxygen)  Patient Saturations on Room Air at Rest = 95%  Patient Saturations on Room Air while Ambulating = 87%, improved to 95 after encouraging patient to take deep breaths  Pt denies chest pain, dizziness, and shortness of breath. Tolerated well.

## 2018-11-16 NOTE — Progress Notes (Signed)
Subjective:  No acute events overnight.  O2 sat 92-98% on 3L [place].  Currently he denies SOB.  His abdominal pain has resolved.  Yesterday afternoon had a 120 beat run of underlying V-tach for about one minute and 20 seconds which resolved on its own.  He was asymptomatic during this time.  He was assessed by resident who discussed with Cardiology fellow on call.  It was felt ICD likely did not fire due to slow rate.  Increased his amiodarone to 200mg  BID (had recently been decreased by Dr Lovena Le to 1 g/week).  He was also still significantly volume overloaded last night but declined repeat Lasix as he wanted to sleep.    Objective:  Vital signs in last 24 hours: Vitals:   11/15/18 1927 11/16/18 0021 11/16/18 0535 11/16/18 0902  BP: (!) 109/49 (!) 120/53 (!) 123/58 128/67  Pulse: (!) 57 61 60 (!) 59  Resp: 20 18 18 19   Temp: 98.2 F (36.8 C) 97.7 F (36.5 C) 97.8 F (36.6 C) 98.7 F (37.1 C)  TempSrc: Oral Oral Oral Oral  SpO2: 93% 95% 98% (!) 89%  Weight:   88 kg   Height:       Weight change: -0.635 kg  Intake/Output Summary (Last 24 hours) at 11/16/2018 1044 Last data filed at 11/16/2018 [id number] Gross per 24 hour  Intake 480 ml  Output 1075 ml  Net -595 ml   General: awake, alert, sitting comfortably on side of bed in NAD Pulm: lungs clear to auscultation bilaterally, normal work of breathing on 3L [place] CV: regular rate and rhythm, no murmurs, rubs or gallups GI: abdomen distended but nontender MSK: 2+ bilateral pitting edema in lower extremities Psych: normal mood and affect    Assessment/Plan:  Principal Problem:   Acute on chronic combined systolic and diastolic heart failure (HCC) Active Problems:   Essential hypertension   Atrial fibrillation (HCC)   Acute respiratory failure with hypoxia (HCC)   Anemia, iron deficiency   85 y.o. male with a PMHx of HFrEF, Afib, VFib w/ biventricular AICD placement (cardiologist Dr. Cristopher Peru), HTN and ischemic cardiomyopathy  who presented with acute hypoxic respiratory failure and HFrEF exacerbation..  Acute hypoxic respiratory failure: Currently breathing well with normal O2 sat on 3L [place] - Will try to wean off O2 (not on oxygen at home) - Trial of ambulation without supplemental O2 later today  HFrEF exacerbation: Continues to appear volume-overloaded on exam but symptomatically improved on Lasix 80 mg QD and 3L O2 [place].  Still unclear etiology but new iron-deficiency anemia may be contributing, as well as dietary sodium intake.  Echo yesterday showed LVEF 35-40% (down from the 40s in 2011), akinesis of the entire inferior wall extending into the apex (prior echo in 2011 showed only akinesis of the inferoposterior myocardium), and G2DD.  Troponin levels flat yesterday (23 -> 33 -> 41 -> 38).  Pacemaker was interrogated yesterday and was concerning only for "fluid build-up."  TSH yesterday was normal at 1.581, FT4 slightly elevated at 1.32.  Repeat EKG this morning still without evidence of new ischemia.   - Trial off oxygen as above - Consider cardiology consult if unable to wean patient off oxygen - Continue IV Lasix 80mg  QD - Continue carvedilol 25mg  BID and spironolactone 12.5mg  QD - Continue tamsulosin 0.4mg  QD to ensure good urine output - Continue cardiac monitoring and continuous pulse ox - Outpatient sleep study given concern for OSA  Iron deficiency anemia: Iron panel was checked yesterday  given new normocytic anemia with Hgb of 11.6 (MCV 89), and this showed low iron level of 33, low saturation of 8%, high-normal TIBC of 395, and normal ferritin of 55, suggesting iron deficiency anemia.  No obvious etiology; he denies blood in stool, and abdominal pain is improved after Maalox and Lasix (he thinks due to improvement in swelling).  He has not had a colonoscopy in many years. - Give IV Feraheme 510mg  - May benefit from outpatient colonoscopy  Atrial fibrillation: Regular rate and rhythm currently.  He is not on  anticoagulation currently.  Used to be on Xarelto but stopped due to it being too expensive. - Continue amiodarone 200mg  BID Monday through Friday (increased from 100mg  given VTach episode last night) - Continue carvedilol 25mg  BID - Consult Care Management for price check for Eliquis vs. Xarelto - Continue cardiac monitoring  Hypertension: BP well-controlled currently. - Continue spironolactone 12.5mg  QD - Continue carvedilol 25mg  BID  Dispo: Anticipated discharge in 1-2 days   LOS: 1 day   Tonia Ghent, Medical Student 11/16/2018, 10:44 AM

## 2018-11-17 DIAGNOSIS — I5021 Acute systolic (congestive) heart failure: Secondary | ICD-10-CM

## 2018-11-17 LAB — CBC
HCT: 37 % — ABNORMAL LOW (ref 39.0–52.0)
Hemoglobin: 11.7 g/dL — ABNORMAL LOW (ref 13.0–17.0)
MCH: 28 pg (ref 26.0–34.0)
MCHC: 31.6 g/dL (ref 30.0–36.0)
MCV: 88.5 fL (ref 80.0–100.0)
Platelets: 143 10*3/uL — ABNORMAL LOW (ref 150–400)
RBC: 4.18 MIL/uL — ABNORMAL LOW (ref 4.22–5.81)
RDW: 14.9 % (ref 11.5–15.5)
WBC: 6.5 10*3/uL (ref 4.0–10.5)
nRBC: 0 % (ref 0.0–0.2)

## 2018-11-17 LAB — BASIC METABOLIC PANEL
Anion gap: 14 (ref 5–15)
BUN: 27 mg/dL — ABNORMAL HIGH (ref 8–23)
CO2: 27 mmol/L (ref 22–32)
Calcium: 9.2 mg/dL (ref 8.9–10.3)
Chloride: 97 mmol/L — ABNORMAL LOW (ref 98–111)
Creatinine, Ser: 1.3 mg/dL — ABNORMAL HIGH (ref 0.61–1.24)
GFR calc Af Amer: 58 mL/min — ABNORMAL LOW (ref >60)
GFR calc non Af Amer: 50 mL/min — ABNORMAL LOW (ref >60)
Glucose, Bld: 118 mg/dL — ABNORMAL HIGH (ref 70–99)
Potassium: 5 mmol/L (ref 3.5–5.1)
Sodium: 138 mmol/L (ref 135–145)

## 2018-11-17 LAB — MAGNESIUM: Magnesium: 2.5 mg/dL — ABNORMAL HIGH (ref 1.7–2.4)

## 2018-11-17 MED ORDER — FUROSEMIDE 40 MG PO TABS: 40.0000 mg | ORAL_TABLET | Freq: Every day | ORAL | 0 refills | Status: DC

## 2018-11-17 MED ORDER — AMIODARONE HCL 200 MG PO TABS: 200.0000 mg | ORAL_TABLET | Freq: Two times a day (BID) | ORAL | 0 refills | Status: DC

## 2018-11-17 MED ORDER — FUROSEMIDE 40 MG PO TABS
40.0000 mg | ORAL_TABLET | Freq: Every day | ORAL | Status: DC
  Administered 2018-11-17: 10:00:00 40 mg via ORAL
  Filled 2018-11-17: qty 1

## 2018-11-17 MED ORDER — FLUTICASONE PROPIONATE 50 MCG/ACT NA SUSP
1.0000 | Freq: Every day | NASAL | Status: DC
  Administered 2018-11-17: 08:00:00 1 via NASAL
  Filled 2018-11-17: qty 16

## 2018-11-17 MED ORDER — APIXABAN 5 MG PO TABS: 5.0000 mg | ORAL_TABLET | Freq: Two times a day (BID) | ORAL | 0 refills | Status: DC

## 2018-11-17 NOTE — Discharge Instructions (Addendum)
Mr.Geerdes,  Thank you for trusting me with your care. To recap, we treated you for the following:  Heart failure exacerbation - You came in with difficulty breathing from having fluid on your lungs. This is caused by heart failure and we were able to get the fluid off of you with Lasix ( water pill). Your breathing improved. We do not know the reason for the exacerbation. A modifiable possibility is eating a high salt diet. Limit your salt intake to 2 G per day.  Iron Defecieny Anemia We gave you iron and we will recommend to your primary care doctor to monitor this.  Also you should have a colonoscopy for routine health care.   Fast heart beat ( Ventricular Tachycardia) You take a medication amiodarone. We increased the dose because your heart was beating too fast.  -You were taking Amiodarone 100 mg twice a day, we increase it to  Amiodarone 200 mg twice a day   You will need to have hospital follow up with a primary care doctor. We arranged this with our clinic.  You also need to call Dr.Taylor and schedule a follow up appointment in the next 2 weeks.   My best,  Tamsen Snider, MD

## 2018-11-17 NOTE — Plan of Care (Signed)

## 2018-11-17 NOTE — Evaluation (Signed)
Physical Therapy Evaluation & Discharge Patient Details Name: Mark Nguyen MRN: [id number] DOB: [birth year] Today's Date: 11/17/2018   History of Present Illness  Pt is an 85 y.o. male admitted 11/15/18 with acute hypoxic respiratory failure secondary to HF exacerbation. Other PMH includes afib, CRT-D device, HTN, CAD, ischemic cardiomyopathy.    Clinical Impression  Patient evaluated by Physical Therapy with no further acute PT needs identified. PTA, pt mod indep with intermittent use of walking stick; lives alone with multiple family members nearby. Today, pt ambulating well at supervision-level; stability improved with use of RW. SpO2 93-95% on RA with ambulation. All education has been completed and the patient has no further questions. Pt politely declining recommendations for follow-up with HHPT services. Acute PT is signing off. Thank you for this referral.    Follow Up Recommendations Home health PT;Supervision for mobility/OOB(declined HHPT)    Equipment Recommendations  None recommended by PT    Recommendations for Other Services       Precautions / Restrictions Precautions Precautions: Fall Restrictions Weight Bearing Restrictions: No      Mobility  Bed Mobility Overal bed mobility: Independent             General bed mobility comments: Received sitting EOB  Transfers Overall transfer level: Needs assistance Equipment used: Rolling walker (2 wheeled) Transfers: Sit to/from Stand Sit to Stand: Supervision            Ambulation/Gait Ambulation/Gait assistance: Supervision;Min guard Gait Distance (Feet): 150 Feet Assistive device: Rolling walker (2 wheeled);None Gait Pattern/deviations: Step-through pattern;Decreased stride length;Trunk flexed   Gait velocity interpretation: 1.31 - 2.62 ft/sec, indicative of limited community ambulator General Gait Details: AMbulatory with RW at supervision-level; additional distance without DME and intermittent min  guard for balance  Stairs            Wheelchair Mobility    Modified Rankin (Stroke Patients Only)       Balance Overall balance assessment: Needs assistance   Sitting balance-Leahy Scale: Good       Standing balance-Leahy Scale: Fair Standing balance comment: Can static stand to don mask without UE support                             Pertinent Vitals/Pain Pain Assessment: No/denies pain    Home Living Family/patient expects to be discharged to:: Private residence Living Arrangements: Alone Available Help at Discharge: Family;Available 24 hours/day Type of Home: House Home Access: Stairs to enter   [employer] of Steps: 1 Home Layout: One level Home Equipment: Walker - 2 wheels;Other (comment)(walking stick) Additional Comments: Reports multiple siblings and other family members nearby to assist as much as needed    Prior Function Level of Independence: Independent with assistive device(s)         Comments: Reports mod indep with occasional use of walking stick "to help me get going in the morning." Drives. Denies recent falls     Hand Dominance        Extremity/Trunk Assessment   Upper Extremity Assessment Upper Extremity Assessment: Overall WFL for tasks assessed    Lower Extremity Assessment Lower Extremity Assessment: Generalized weakness       Communication   Communication: HOH  Cognition Arousal/Alertness: Awake/alert Behavior During Therapy: WFL for tasks assessed/performed Overall Cognitive Status: Within Functional Limits for tasks assessed  General Comments General comments (skin integrity, edema, etc.): SpO2 93-95% on RA with rest and activity    Exercises     Assessment/Plan    PT Assessment All further PT needs can be met in the next venue of care  PT Problem List Decreased strength;Decreased activity tolerance;Decreased balance;Decreased  mobility;Cardiopulmonary status limiting activity       PT Treatment Interventions      PT Goals (Current goals can be found in the Care Plan section)  Acute Rehab PT Goals Patient Stated Goal: Home today with brother PT Goal Formulation: All assessment and education complete, DC therapy    Frequency     Barriers to discharge        Co-evaluation               AM-PAC PT "6 Clicks" Mobility  Outcome Measure Help needed turning from your back to your side while in a flat bed without using bedrails?: None Help needed moving from lying on your back to sitting on the side of a flat bed without using bedrails?: None Help needed moving to and from a bed to a chair (including a wheelchair)?: None Help needed standing up from a chair using your arms (e.g., wheelchair or bedside chair)?: None Help needed to walk in hospital room?: A Little Help needed climbing 3-5 steps with a railing? : A Little 6 Click Score: 22    End of Session   Activity Tolerance: Patient tolerated treatment well Patient left: in bed;with call bell/phone within reach(seated EOB) Nurse Communication: Mobility status PT Visit Diagnosis: Other abnormalities of gait and mobility (R26.89)    Time: 4536-4680 PT Time Calculation (min) (ACUTE ONLY): 16 min   Charges:   PT Evaluation $PT Eval Moderate Complexity: [place], PT, DPT Acute Rehabilitation Services  Pager [phone] Office [place] 11/17/2018, 1:01 PM

## 2018-11-17 NOTE — Plan of Care (Signed)
Problem: Education: Goal: Knowledge of General Education information will improve Description: Including pain rating scale, medication(s)/side effects and non-pharmacologic comfort measures 11/17/2018 1245 by Lurline Idol, RN Outcome: Adequate for Discharge 11/17/2018 0934 by Lurline Idol, RN Outcome: Progressing   Problem: Health Behavior/Discharge Planning: Goal: Ability to manage health-related needs will improve 11/17/2018 1245 by Lurline Idol, RN Outcome: Adequate for Discharge 11/17/2018 0934 by Lurline Idol, RN Outcome: Progressing   Problem: Clinical Measurements: Goal: Ability to maintain clinical measurements within normal limits will improve 11/17/2018 1245 by Lurline Idol, RN Outcome: Adequate for Discharge 11/17/2018 0934 by Lurline Idol, RN Outcome: Progressing Goal: Will remain free from infection 11/17/2018 1245 by Lurline Idol, RN Outcome: Adequate for Discharge 11/17/2018 0934 by Lurline Idol, RN Outcome: Progressing Goal: Diagnostic test results will improve 11/17/2018 1245 by Lurline Idol, RN Outcome: Adequate for Discharge 11/17/2018 0934 by Lurline Idol, RN Outcome: Progressing Goal: Respiratory complications will improve 11/17/2018 1245 by Lurline Idol, RN Outcome: Adequate for Discharge 11/17/2018 0934 by Lurline Idol, RN Outcome: Progressing Goal: Cardiovascular complication will be avoided 11/17/2018 1245 by Lurline Idol, RN Outcome: Adequate for Discharge 11/17/2018 0934 by Lurline Idol, RN Outcome: Progressing   Problem: Activity: Goal: Risk for activity intolerance will decrease 11/17/2018 1245 by Lurline Idol, RN Outcome: Adequate for Discharge 11/17/2018 0934 by Lurline Idol, RN Outcome: Progressing   Problem: Nutrition: Goal: Adequate nutrition will be maintained 11/17/2018 1245 by Lurline Idol, RN Outcome: Adequate for Discharge 11/17/2018 0934 by Lurline Idol, RN Outcome: Progressing    Problem: Coping: Goal: Level of anxiety will decrease 11/17/2018 1245 by Lurline Idol, RN Outcome: Adequate for Discharge 11/17/2018 0934 by Lurline Idol, RN Outcome: Progressing   Problem: Elimination: Goal: Will not experience complications related to bowel motility 11/17/2018 1245 by Lurline Idol, RN Outcome: Adequate for Discharge 11/17/2018 0934 by Lurline Idol, RN Outcome: Progressing Goal: Will not experience complications related to urinary retention 11/17/2018 1245 by Lurline Idol, RN Outcome: Adequate for Discharge 11/17/2018 0934 by Lurline Idol, RN Outcome: Progressing   Problem: Pain Managment: Goal: General experience of comfort will improve 11/17/2018 1245 by Lurline Idol, RN Outcome: Adequate for Discharge 11/17/2018 0934 by Lurline Idol, RN Outcome: Progressing   Problem: Safety: Goal: Ability to remain free from injury will improve 11/17/2018 1245 by Lurline Idol, RN Outcome: Adequate for Discharge 11/17/2018 0934 by Lurline Idol, RN Outcome: Progressing   Problem: Skin Integrity: Goal: Risk for impaired skin integrity will decrease 11/17/2018 1245 by Lurline Idol, RN Outcome: Adequate for Discharge 11/17/2018 0934 by Lurline Idol, RN Outcome: Progressing   Problem: Education: Goal: Ability to demonstrate management of disease process will improve 11/17/2018 1245 by Lurline Idol, RN Outcome: Adequate for Discharge 11/17/2018 0934 by Lurline Idol, RN Outcome: Progressing Goal: Ability to verbalize understanding of medication therapies will improve 11/17/2018 1245 by Lurline Idol, RN Outcome: Adequate for Discharge 11/17/2018 0934 by Lurline Idol, RN Outcome: Progressing Goal: Individualized Educational Video(s) 11/17/2018 1245 by Lurline Idol, RN Outcome: Adequate for Discharge 11/17/2018 0934 by Lurline Idol, RN Outcome: Progressing   Problem: Activity: Goal: Capacity to carry out activities will  improve 11/17/2018 1245 by Lurline Idol, RN Outcome: Adequate for Discharge 11/17/2018 0934 by Lurline Idol, RN Outcome: Progressing   Problem: Cardiac: Goal: Ability to achieve and maintain adequate cardiopulmonary perfusion  will improve 11/17/2018 1245 by Lurline Idol, RN Outcome: Adequate for Discharge 11/17/2018 0934 by Lurline Idol, RN Outcome: Progressing

## 2018-11-17 NOTE — Progress Notes (Signed)
  Date: 11/17/2018  Patient name: Mark Nguyen  Medical record number: [id number]  Date of birth: June 10, 1931        I have seen and evaluated this patient and I have discussed the plan of care with the house staff. Please see their note for complete details. I concur with their findings.  Velna Ochs, MD 11/17/2018, 12:02 PM

## 2018-11-17 NOTE — TOC Initial Note (Signed)
Transition of Care [facility]) - Initial/Assessment Note    Patient Details  Name: Mark Nguyen MRN: [id number] Date of Birth: Feb 05, 1931  [facility] CM/SW Contact:    Norina Buzzard, RN Phone Number: 11/17/2018, 11:37 AM  Clinical Narrative:                   Expected Discharge Plan: Home/Self Care Barriers to Discharge: SNF Pending bed offer, Active Substance Use with PICC Line, Other (comment)(Pt reports that he can't afford his copayment for the Eliquis med)   Patient Goals and CMS Choice Patient states their goals for this hospitalization and ongoing recovery are:: to get better      Expected Discharge Plan and Services Expected Discharge Plan: Home/Self Care   Discharge Planning Services: CM Consult   Living arrangements for the past 2 months: Single Family Home                                      Prior Living Arrangements/Services Living arrangements for the past 2 months: Single Family Home Lives with:: Self Patient language and need for interpreter reviewed:: Yes Do you feel safe going back to the place where you live?: Yes      Need for Family Participation in Patient Care: Yes (Comment) Care giver support system in place?: Yes (comment)      Activities of Daily Living Home Assistive Devices/Equipment: Blood pressure cuff, Dentures (specify type), Eyeglasses, Walker (specify type) ADL Screening (condition at time of admission) Patient's cognitive ability adequate to safely complete daily activities?: Yes Is the patient deaf or have difficulty hearing?: Yes Does the patient have difficulty seeing, even when wearing glasses/contacts?: No Does the patient have difficulty concentrating, remembering, or making decisions?: No Patient able to express need for assistance with ADLs?: Yes Does the patient have difficulty dressing or bathing?: No Independently performs ADLs?: Yes (appropriate for developmental age) Does the patient  have difficulty walking or climbing stairs?: Yes Weakness of Legs: Both Weakness of Arms/Hands: None  Permission Sought/Granted Permission sought to share information with : Case Manager                Emotional Assessment Appearance:: Appears stated age Attitude/Demeanor/Rapport: Engaged Affect (typically observed): Accepting, Appropriate, Calm, Stoic Orientation: : Oriented to Self, Oriented to Place, Oriented to  Time, Oriented to Situation      Admission diagnosis:  Hypoxia [R09.02] Acute on chronic congestive heart failure, unspecified heart failure type [facility]) [I50.9] Patient Active Problem List   Diagnosis Date Noted  . Acute respiratory failure with hypoxia ([place]) 11/16/2018  . Anemia, iron deficiency 11/16/2018  . Acute on chronic combined systolic and diastolic heart failure ([place]) 11/15/2018  . Atrial fibrillation ([place]) 01/18/2011  . CARDIOVASCULAR FUNCTION STUDY, ABNORMAL 10/29/2008  . SYSTOLIC HEART FAILURE, CHRONIC 06/09/2008  . DYSLIPIDEMIA 03/25/2008  . Essential hypertension 03/25/2008  . CAD, UNSPECIFIED SITE 03/25/2008  . ISCHEMIC CARDIOMYOPATHY 03/25/2008  . LBBB 03/25/2008  . VENTRICULAR TACHYCARDIA 03/25/2008  . VENTRICULAR FIBRILLATION 03/25/2008  . PERSONAL HISTORY OF SUDDEN CARDIAC ARREST 03/25/2008  . Automatic implantable cardioverter-defibrillator in situ 03/25/2008   PCP:  Patient, No Pcp Per Pharmacy:   [place], [place] [place] [address] Phone: [phone] Fax: [phone]  [place], [address] [place] [address] Phone:  [phone] Fax: [phone]     Social Determinants of Health (SDOH) Interventions    Readmission Risk Interventions No flowsheet data found.

## 2018-11-17 NOTE — Care Management Note (Addendum)
Case Management Note  Patient Details  Name: Mark Nguyen MRN: [id number] Date of Birth: 1931-06-25  Subjective/Objective:  85 yo M admitted with acute hypoxic respiratory failure secondary to HF exacerbation    Action/Plan: Received referral to assist with Eliquis prescription   Expected Discharge Date:  11/17/18               Expected Discharge Plan:  Home/Self Care  In-House Referral:     Discharge planning Services  CM Consult  Post Acute Care Choice:    Choice offered to:     DME Arranged:    DME Agency:     HH Arranged:    HH Agency:     Status of Service:     If discussed at [employer], dates discussed:    Additional Comments: Contacted pt's pharmacist  to f/u with pt's co-payment for the Eliquis prescrition. Spoke with Vaughan Basta and she reports that his co-payment is $412 because he hasn't met his deductible. Informed Vaughan Basta that I'll provide the pt a 30-day free card for Eliquis. Met with pt. He plans to return home. He reports that he lives alone and he is to manage his ADL's. He has a cane and a RW. He reports that he only uses the can prn. He has a brother that can assist if prn. He reports that he drives. Discussed co-payment for the Eliquis. He reports that he is not able to afford the co-payment. Provided pt with a free 30-day card for Eliquis. Encouraged pt to discuss with his doctor the co-payment for the Eliquis and his inability to afford the co-payment. Discussed the importance of taking the medication as prescribed and discussing with his doctor his inability to afford the co-payment. He verbalized understanding.   Norina Buzzard, RN 11/17/2018, 12:57 PM

## 2018-11-17 NOTE — Progress Notes (Addendum)
   Subjective: Patient sitting on edge of bed on approach and is off supplemental oxygen. Reports his breathing has continued to improve. Nose is dry and patient agrees it is likely from the oxygen. He reports he feels a little unstable walking by himself. He is ready for discharge and says his brother will pick him up. He does not have a PCP, but agreeable to having hospital follow up with IMTS clinic. Denies any sinus pain.   Objective:  Vital signs in last 24 hours: Vitals:   11/16/18 1231 11/16/18 1709 11/16/18 1948 11/17/18 0527  BP: (!) 108/55 128/70 130/71 130/82  Pulse: (!) 59 (!) 59 (!) 59 (!) 58  Resp: 18 18 17 16   Temp: 98 F (36.7 C) 97.6 F (36.4 C) 98.7 F (37.1 C) 98.5 F (36.9 C)  TempSrc: Oral Oral Oral Oral  SpO2: 98% 91% 93% 93%  Weight:    86.8 kg  Height:       Gen: Resting on side of bed, NAD Cardiovascular: Regular rate , rhythm. No murmurs , rubs, or gallops Pulmonary: CTAB, no wheezes, rales , or rhonchi  MSK: 1+ pitting edema in lower extremities   Assessment/Plan:   Principal Problem:   Acute on chronic combined systolic and diastolic heart failure (HCC) Active Problems:   Essential hypertension   Atrial fibrillation (HCC)   Acute respiratory failure with hypoxia (HCC)   Anemia, iron deficiency  85 year old male with a past medical history of HFrEF, A. Fib not on anticoagulation, V. fib with biventricular CRT-D device, hypertension, CAD, and ischemic cardiomyopathy who presented with acute hypoxic respiratory failure secondary to HFrEF exacerbation.  #Acute hypoxic respiratory failure ( resolved) Ambulated with pulse ox.  Nursing note reports patient desatted to 87% on room air ,but SPO2 95% when told to take deep breaths.  Resting comfortably with no supplemental oxygen today.   #HFrEF Net -1.6 L yesterday. Weight yesterday 88 kg , today 86.8 kg. Appears close to a dry weight ( do not know established dry weight)  on exam, some mild edema in lower  extremities. Patient was on spirnolactone , but not lasix. Will discharge on lasix and have patient follow up with his cardiologist outpatient.  - PO lasix 40 mg - Carvedilol 25 mg twice daily and spironolactone 12.5 mg daily -We will recommend outpatient sleep study for OSA  #Iron deficiency anemia Labs on this admission showed iron deficiency anemia. Patient given one dose IV Feraheme .Hemoglobin stable today at 11.6.  Will recommend outpatient colonoscopy , no recent colonoscopy on record.   - follow up as outpatient  #Atrial Fibrillation - Stopped taking anticoagulation due to cost. - Sent prescription to pharmacy and TOC is calling to find cost of Eliquis. Plan to send patient out on Eliquis - Review Tele, no V tach overnight - Continue Carvedilol as above  #HTN BP well controlled - Continue Carvedilol as above and spironolactone 12.5mg  QD  Dispo: Anticipated discharge today.   Tamsen Snider, MD PGY1  [phone]

## 2018-11-19 NOTE — Discharge Summary (Signed)
Name: Mark Nguyen MRN: [id number] DOB: Jan 22, 1931 85 y.o. PCP: Patient, No Pcp Per  Date of Admission: 11/15/2018  5:15 AM Date of Discharge: 11/17/2018 Attending Physician: Velna Ochs R  Discharge Diagnosis: 1. Acute hypoxic respiratory failure 2/2 HFrEF exacerbation 2. Iron Deficiency Anemia  Ventricular Tachycardia  Discharge Medications: Allergies as of 11/17/2018   No Known Allergies     Medication List    TAKE these medications   amiodarone 200 MG tablet Commonly known as: PACERONE Take 1 tablet (200 mg total) by mouth 2 (two) times daily. What changed:   how much to take  how to take this  when to take this  additional instructions   aspirin EC 81 MG tablet Take 1 tablet (81 mg total) by mouth daily.   atorvastatin 20 MG tablet Commonly known as: LIPITOR Take 1 tablet (20 mg total) by mouth at bedtime.   carvedilol 25 MG tablet Commonly known as: COREG TAKE 1 TABLET TWICE DAILY WITH A MEAL. What changed: See the new instructions.   furosemide 40 MG tablet Commonly known as: LASIX Take 1 tablet (40 mg total) by mouth daily.   indomethacin 75 MG CR capsule Commonly known as: INDOCIN SR Take 75 mg by mouth daily as needed (gout attacks).   isosorbide mononitrate 30 MG 24 hr tablet Commonly known as: IMDUR Take 0.5 tablets (15 mg total) by mouth daily.   omeprazole 20 MG capsule Commonly known as: PRILOSEC 1CD What changed: See the new instructions.   spironolactone 25 MG tablet Commonly known as: ALDACTONE Take 0.5 tablets (12.5 mg total) by mouth daily.   tamsulosin 0.4 MG Caps capsule Commonly known as: FLOMAX Take 0.4 mg by mouth daily after breakfast.   Vitamin D-3 25 MCG (1000 UT) Caps Take 1,000 Units by mouth daily.       Disposition and follow-up:   Mark Nguyen was discharged from [facility] in Stable condition.  At the hospital follow up visit please address:  1.    #PCP Patient does  not have PCP, discuss with patient if he would like to continue to be seen in [facility].  #Acute Hypoxic Respiratory Failure 2/2 HFrEF exacerbation.   - acute hypoxic respiratory failure resolved with diuresis - No definite reason for exacerbation. Diuresed ~3L - Sent out on PO lasix 40mg  , weight on discharge 86.8kg  - BMP at follow up - consider outpatient OSA study  #Ventricular Tachycardia -Episodes of Vtach on telemetry, cards fellow recommended increasing amiodarone to 200 mg twice daily - Follow up with Cardiology - patient of Dr.Taylor  #Hx of Paroxysmal Atrial Fibrillation - CHA2DS2-VASc (5) higher than has bled (2), patient not on anticoagulation due to cost. Please discuss any options available through clinic for Eliquis or Xarelto, not taking due to cost.. Patient on Warfarin in the past and reports complications.   #IDA - Patient given one dose of Feraheme, recommend follow up Iron studies in 4 weeks.  - CBC on follow-up - Needs outpatient GI Eval  2.  Labs / imaging needed at time of follow-up: CBC, BMP, Future Iron studies  3.  Pending labs/ test needing follow-up:   Follow-up Appointments:   Hospital Course by problem list:  #Acute hypoxic respiratory failure  Requiring 3L supplemental O2 via [facility] on admission. Improved with diuresis. Ambulated with pulse ox, sating well on RA at discharge.   #HFrEF  Echo on admission show decreased EF from 40-45% to 35-40% from prior Echo  in 2011, new akinetic inferior wall.  No definite reason for exacerbation,possibe salt in diet or age related worsening of CHF. Net ~ 3 L on admission.yesterday. Weight on admission ~88 kg , discharge weight 86.8 kg.( appreciate the weight do not match output) Appears close to a dry weight ( do not know established dry weight)  on exam, some mild edema in lower extremities. Patient was on spirnolactone , but not lasix. Will discharge on PO lasix and have patient follow up with his cardiologist  outpatient. Continued Carvedilol 25 mg twice daily and spironolactone 12.5 mg daily .  #Iron deficiency anemia Labs on this admission showed iron deficiency anemia. Patient given one dose IV Feraheme .Hemoglobin stable at discharge.  #Atrial Fibrillation - Stopped taking anticoagulation due to cost. - Sent prescription to pharmacy and TOC called and price ~$400 per month. Given 1 month prescription card.   #HTN BP well controlled on Carvedilol and Spirolactone.   Discharge Vitals:   BP 114/69 (BP Location: Left Arm)    Pulse (!) 59    Temp 98.1 F (36.7 C) (Oral)    Resp 14    Ht 5\' 7"  (1.702 m)    Wt 86.8 kg    SpO2 95%    BMI 29.96 kg/m   Pertinent Labs, Studies, and Procedures:  CMP Latest Ref Rng & Units 11/17/2018 11/16/2018 11/15/2018  Glucose 70 - 99 mg/dL 118(H) 126(H) -  BUN 8 - 23 mg/dL 27(H) 23 -  Creatinine 0.61 - 1.24 mg/dL 1.30(H) 1.39(H) -  Sodium 135 - 145 mmol/L 138 141 -  Potassium 3.5 - 5.1 mmol/L 5.0 4.8 3.7  Chloride 98 - 111 mmol/L 97(L) 100 -  CO2 22 - 32 mmol/L 27 27 -  Calcium 8.9 - 10.3 mg/dL 9.2 8.9 -  Total Protein 6.5 - 8.1 g/dL - - -  Total Bilirubin 0.3 - 1.2 mg/dL - - -  Alkaline Phos 38 - 126 U/L - - -  AST 15 - 41 U/L - - -  ALT 0 - 44 U/L - - -   BMP Latest Ref Rng & Units 11/17/2018 11/16/2018 11/15/2018  Glucose 70 - 99 mg/dL 118(H) 126(H) -  BUN 8 - 23 mg/dL 27(H) 23 -  Creatinine 0.61 - 1.24 mg/dL 1.30(H) 1.39(H) -  Sodium 135 - 145 mmol/L 138 141 -  Potassium 3.5 - 5.1 mmol/L 5.0 4.8 3.7  Chloride 98 - 111 mmol/L 97(L) 100 -  CO2 22 - 32 mmol/L 27 27 -  Calcium 8.9 - 10.3 mg/dL 9.2 8.9 -   Iron/TIBC/Ferritin/ %Sat    Component Value Date/Time   IRON 33 (L) 11/15/2018 1053   TIBC 395 11/15/2018 1053   FERRITIN 55 11/15/2018 1053   IRONPCTSAT 8 (L) 11/15/2018 1053     TTE on 11/12  Sonographer Comments: Technically difficult study due to poor echo windows. IMPRESSIONS    1. There is akinesis of the entire inferior  wall, extending into the apex. 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 Globally the EF is ~35-40%. There is global hypokinesis in addition to the above regional wall motion  abnormalities. Contrast was used and there is no evidence of thrombus in the LV apex. This is consistent with a prior inferior wall infarction. The last echo report from 2011 reports an EF 40-45% but was unable to be viewed for comparison.  2. Left ventricular diastolic parameters are consistent with Grade III diastolic dysfunction (restrictive).  3. Elevated left  atrial pressure.  4. Definity contrast agent was given IV to delineate the left ventricular endocardial borders.  5. Mildly dilated left ventricular internal cavity size.  6. The left ventricle demonstrates regional wall motion abnormalities.  7. Global right ventricle has mildly reduced systolic function.The right ventricular size is normal. No increase in right ventricular wall thickness.  8. Left atrial size was severely dilated.  9. Right atrial size was moderately dilated. 10. The tricuspid valve is grossly normal. Tricuspid valve regurgitation is mild. 11. The aortic valve is tricuspid. Aortic valve regurgitation is mild. Mild to moderate aortic valve sclerosis/calcification without any evidence of aortic stenosis. 12. The pulmonic valve was grossly normal. Pulmonic valve regurgitation is trivial. 13. Moderately elevated pulmonary artery systolic pressure. 14. The tricuspid regurgitant velocity is 3.01 m/s, and with an assumed right atrial pressure of 15 mmHg, the estimated right ventricular systolic pressure is moderately elevated at 51.2 mmHg. 15. A venous catheter is visualized in the RA and RV. 16. The inferior vena cava is dilated in size with <50% respiratory variability, suggesting right atrial pressure of 15 mmHg. 17. The mitral valve is degenerative. Mild mitral valve regurgitation. 18. Mild mitral annular  calcification.  FINDINGS  Left Ventricle: Left ventricular ejection fraction, by visual estimation, is 35 to 40%. The left ventricle has moderately decreased function. Definity contrast agent was given IV to delineate the left ventricular endocardial borders. The left ventricle  demonstrates regional wall motion abnormalities. The left ventricular internal cavity size was mildly dilated left ventricle. There is no left ventricular hypertrophy. Left ventricular diastolic parameters are consistent with Grade III diastolic  dysfunction (restrictive). Elevated left atrial pressure. There is akinesis of the entire inferior wall, extending into the apex. 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 Globally the EF is ~35-40%. There is global hypokinesis  in addition to the above regional wall motion abnormalities. Contrast was used and there is no evidence of thrombus in the LV apex. This is consistent with a prior inferior wall infarction. The last echo report from 2011 reports an EF 40-45% but was  unable to be viewed for comparison.  Right Ventricle: The right ventricular size is normal. No increase in right ventricular wall thickness. Global RV systolic function is has mildly reduced systolic function. The tricuspid regurgitant velocity is 3.01 m/s, and with an assumed right atrial  pressure of 15 mmHg, the estimated right ventricular systolic pressure is moderately elevated at 51.2 mmHg.  Left Atrium: Left atrial size was severely dilated.  Right Atrium: Right atrial size was moderately dilated  Pericardium: There is no evidence of pericardial effusion.  Mitral Valve: The mitral valve is degenerative in appearance. Mild mitral annular calcification. Mild mitral valve regurgitation.  Tricuspid Valve: The tricuspid valve is grossly normal. Tricuspid valve regurgitation is mild.  Aortic Valve: The aortic valve is tricuspid. Aortic valve regurgitation is mild. Mild to  moderate aortic valve sclerosis/calcification is present, without any evidence of aortic stenosis.  Pulmonic Valve: The pulmonic valve was grossly normal. Pulmonic valve regurgitation is trivial.  Aorta: The aortic root is normal in size and structure. The ascending aorta was not well visualized.  Venous: The inferior vena cava is dilated in size with less than 50% respiratory variability, suggesting right atrial pressure of 15 mmHg.  IAS/Shunts: No atrial level shunt detected by color flow Doppler.   Additional Comments: A venous catheter is visualized in the right atrium and right ventricle Discharge Instructions: Discharge Instructions    (  HEART FAILURE PATIENTS) Call MD:  Anytime you have any of the following symptoms: 1) 3 pound weight gain in 24 hours or 5 pounds in 1 week 2) shortness of breath, with or without a dry hacking cough 3) swelling in the hands, feet or stomach 4) if you have to sleep on extra pillows at night in order to breathe.   Complete by: As directed    Diet - low sodium heart healthy   Complete by: As directed    Increase activity slowly   Complete by: As directed       Signed:  Tamsen Snider, MD PGY1  [phone]

## 2018-11-20 ENCOUNTER — Other Ambulatory Visit: Payer: Self-pay | Admitting: *Deleted

## 2018-11-20 NOTE — Patient Outreach (Signed)
[facility]) Care Management  11/20/2018  KIONTE SURMAN Apr 15, 1931 [id number]   EMMI-general discharge, RED ON EMMI ALERT Day # 1 Date: Monday  Red Alert Reason: Know who to call about changes in condition? No Scheduled follow-up? No  Insurance: medicare [facility] admissions x 1  ED visits x 1 in the last 6 months  Date of Admission: 11/15/2018  5:15 AM Date of Discharge: 11/17/2018 Acute hypoxic respiratory failure 2/2 HFrEF exacerbation  Outreach attempt # 1  Patient is able to verify HIPAA, DOB and address [facility] Care Management RN reviewed and addressed red alert with patient, Iron Deficiency Anemia, Ventricular Tachycardia             General EMMI:  Mr Conwill is very hard of hearing, he is aware of this but reports he is unable to afford hearing aids as he is on a fixed income of social security (SSI).  He reports difficulty with EMMI automated system related to this and not being able to inform the automatic sytem of his answers in detail  He confirms not knowing who to call about changes in condition and about having a scheduled f/u appointment. His follow up appointments were not listed on his hospital after summary/discharge instructions.  [facility] RN CM noted he is to see MD at [facility] cone internal medicine clinic on 11/22/18 at 1345. He reports he was called and informed of this but does not know which entrance to enter, does not have an address, does not know who he is to see at the clinic and does not have a number for the clinic [facility] RN CM completed a conference call with Mr Peot to [phone] and spoke with May at the [facility] cone internal medicine clinic. He was given the address of [place]. He was instructed to go to [facility] cone entrance A where the valet staff are located. He reports he will be driving himself but is having his sister, [name] with him. He was informed to give his car to the valet, go to the receptionist desk and get  assistance to finding or helping him get to the clinic. He voiced understanding He repeated this information over several times.  He was assisted and wrote down this information He was instructed to call [phone] if problems and he voiced understanding  Social: Mr TAYARI RAMOS lives alone but has support of a brother, [name], sister, [name] and a nephew. He continues to be independent in his care needs and continues to drive.    Conditions: Acute hypoxic respiratory failure 2/2 HFrEF exacerbation, HTN, CAD, LBBB, ischemic cardiomyopathy, ventricular tachycardia, iron deficiency anemia, HLD, atrial fibrillation,   DME: BP monitor, dentures, eyeglasses, walker, need hearing aides but can not afford on SSI fixed income  Medications: denies concerns with taking medications as prescribed, affording medications, side effects of medications and questions about medications He does mentioned informing [name] at the hospital that he would not take nor could afford Eliquis  CM referred him to the [facility] cone internal medicine staff for a patient assistance program prn as he refused assist from [place] on today for a [place] referral     Appointments: 11/22/18 [place] cone internal medicine clinic Dr Koleen Distance or resident    Advance Directives: Denies need for assist with advance directives    Consent: [facility] RN CM reviewed [facility] services with patient. Patient gave verbal consent for services [facility] telephonic RN CM.  Advised patient that there will be further automated EMMI- post discharge calls to assess how the patient is doing following the recent hospitalization Advised the patient that another call may be received from a nurse if any of their responses were abnormal. Patient voiced understanding and was appreciative of f/u call.   Plan: [facility] RN CM will close case at this time as patient has been assessed and no needs identified/needs resolved.   Pt encouraged to return a call to  [place] CM prn  [facility] RN CM sent a successful outreach letter as discussed with [facility] brochure enclosed for review  Routed note to MDs/NP/PA   [place]. Lavina Hamman, RN, BSN, [place] Coordinator Office number [phone] Mobile number [phone]  Main [facility] number [phone] Fax number [phone]

## 2018-11-22 ENCOUNTER — Other Ambulatory Visit: Payer: Self-pay

## 2018-11-22 ENCOUNTER — Ambulatory Visit (INDEPENDENT_AMBULATORY_CARE_PROVIDER_SITE_OTHER): Payer: Medicare Other | Admitting: Internal Medicine

## 2018-11-22 ENCOUNTER — Encounter: Payer: Self-pay | Admitting: Internal Medicine

## 2018-11-22 VITALS — BP 117/61 | HR 60 | Temp 98.1°F | Ht 67.0 in | Wt 185.0 lb

## 2018-11-22 DIAGNOSIS — D509 Iron deficiency anemia, unspecified: Secondary | ICD-10-CM | POA: Diagnosis not present

## 2018-11-22 DIAGNOSIS — Z87891 Personal history of nicotine dependence: Secondary | ICD-10-CM | POA: Diagnosis not present

## 2018-11-22 DIAGNOSIS — R739 Hyperglycemia, unspecified: Secondary | ICD-10-CM | POA: Diagnosis not present

## 2018-11-22 DIAGNOSIS — Z79899 Other long term (current) drug therapy: Secondary | ICD-10-CM

## 2018-11-22 DIAGNOSIS — Z7982 Long term (current) use of aspirin: Secondary | ICD-10-CM

## 2018-11-22 DIAGNOSIS — I5020 Unspecified systolic (congestive) heart failure: Secondary | ICD-10-CM

## 2018-11-22 DIAGNOSIS — Z9189 Other specified personal risk factors, not elsewhere classified: Secondary | ICD-10-CM

## 2018-11-22 DIAGNOSIS — I4891 Unspecified atrial fibrillation: Secondary | ICD-10-CM

## 2018-11-22 DIAGNOSIS — I5043 Acute on chronic combined systolic (congestive) and diastolic (congestive) heart failure: Secondary | ICD-10-CM

## 2018-11-22 DIAGNOSIS — I10 Essential (primary) hypertension: Secondary | ICD-10-CM

## 2018-11-22 LAB — POCT GLYCOSYLATED HEMOGLOBIN (HGB A1C): Hemoglobin A1C: 5.6 % (ref 4.0–5.6)

## 2018-11-22 LAB — GLUCOSE, CAPILLARY: Glucose-Capillary: 121 mg/dL — ABNORMAL HIGH (ref 70–99)

## 2018-11-22 NOTE — Patient Instructions (Addendum)
You were seen for followup after your hospitalization. We are getting bloodwork today to check your electrolytes and your hemoglobin levels.  Since your hemoglobin and iron levels were low in the hospital, we have sent a referral to the gastroenterologists to evaluate you. You will get a call from them to schedule an appointment.  We will talk with the pharmacists to see if there are any programs you can enroll in to help afford Xarelto or Eliquis.   Please continue to take all of your medications as prescribed.

## 2018-11-22 NOTE — Progress Notes (Signed)
   CC: Hospital followup  HPI: Patient is an 85 year old male with past medical history as below who presents after hospitalization for heart failure exacerbation, discharged on 11/14.  Mark Nguyen is a 85 y.o.   Past Medical History:  Diagnosis Date  . CAD (coronary artery disease)   . CHF (congestive heart failure) ([place])   . Dyslipidemia   . History of ventricular fibrillation   . HTN (hypertension)   . Hx-sudden cardiac arrest   . Ischemic cardiomyopathy   . LBBB (left bundle branch block)   . Ventricular tachycardia ([place])    Review of Systems:   Review of Systems  Constitutional: Negative for chills and fever.  HENT: Negative for congestion.   Respiratory: Negative for cough and shortness of breath.   Cardiovascular: Negative for chest pain.  Gastrointestinal: Negative for abdominal pain, constipation, diarrhea, nausea and vomiting.  All other systems reviewed and are negative.   Physical Exam:  Vitals:   11/22/18 1339  BP: 117/61  Pulse: 60  Temp: 98.1 F (36.7 C)  TempSrc: Oral  SpO2: 98%  Weight: 185 lb (83.9 kg)  Height: 5\' 7"  (1.702 m)   Physical Exam  Constitutional: He is well-developed, well-nourished, and in no distress.  HENT:  Head: Normocephalic and atraumatic.  Eyes: EOM are normal. Right eye exhibits no discharge. Left eye exhibits no discharge.  Neck: Normal range of motion. No tracheal deviation present.  Cardiovascular: Normal rate and regular rhythm. Exam reveals no gallop and no friction rub.  No murmur heard. Pulmonary/Chest: Effort normal and breath sounds normal. No respiratory distress. He has no wheezes. He has no rales.  Abdominal: Soft. He exhibits no distension. There is no abdominal tenderness. There is no rebound and no guarding.  Musculoskeletal: Normal range of motion.        General: No tenderness, deformity or edema.  Neurological: He is alert. Coordination normal.  Skin: Skin is warm and dry. No rash noted. He is not  diaphoretic. No erythema.  Psychiatric: Memory and judgment normal.   Fam Hx: * Father had a CVA * Sister with diabetes  Social: * Used to smoke 1 pack/day for 30 years, quit 30 years ago * Drinks beer occasionally * Denies recreational drug use * Lives alone in [place]  Allergies: No known allergies   Assessment & Plan:   See Encounters Tab for problem based charting.  Patient seen and discussed with Dr. Dareen Piano

## 2018-11-22 NOTE — Assessment & Plan Note (Addendum)
Patient with hyperglycemia on a.m. labs during hospitalization.  Will check for diabetes with hemoglobin A1c. HgbA1C of 5.6

## 2018-11-22 NOTE — Assessment & Plan Note (Addendum)
Lab Results  Component Value Date   WBC 6.5 11/17/2018   HGB 11.7 (L) 11/17/2018   HCT 37.0 (L) 11/17/2018   MCV 88.5 11/17/2018   PLT 143 (L) 11/17/2018   Iron/TIBC/Ferritin/ %Sat    Component Value Date/Time   IRON 33 (L) 11/15/2018 1053   TIBC 395 11/15/2018 1053   FERRITIN 55 11/15/2018 1053   IRONPCTSAT 8 (L) 11/15/2018 1053   Lab values during hospitalization reveals mild iron deficiency anemia.  Patient given Feraheme during hospitalization.  Given patient age, iron deficiency anemia likely secondary to GI blood loss. *Referral placed to GI.  Likely will need colonoscopy to evaluate for source of blood loss. *We will repeat CBC today to assess stability of hemoglobin.

## 2018-11-22 NOTE — Assessment & Plan Note (Signed)
Per hospital note, patient with CHADSVASC score of 5 and has bled score of 2.  Patient would benefit from anticoagulant therapy.  Patient was previously on warfarin several years ago but discontinued due to difficulty maintaining stable/therapeutic INR.  Eliquis and Xarelto are not options due to cost. *Will reach out to clinical pharmacist to assess if there are any financial assistance programs for Eliquis or Xarelto.

## 2018-11-22 NOTE — Assessment & Plan Note (Addendum)
Patient reports that prior to hospitalization he was unable to sleep lying flat.  Patient reports significant improvement in breathing, able to sleep lying flat.  Weight at discharge of 191 pounds, weight today 185 pounds.  Physical exam without peripheral edema.  Patient reports taking Lasix 40 mg daily. *Checked BMP for electrolytes, kidney function - creatinine of 1.61, up from 1.30 at discharge. Discussed options with patient of titrating dose based on weights gain vs. Taking every other day. Patient elected to reduce dose to every other day - Lasix 40 mg every other day * Recheck BMP at followup in 1-2 weeks

## 2018-11-23 DIAGNOSIS — Z9189 Other specified personal risk factors, not elsewhere classified: Secondary | ICD-10-CM | POA: Insufficient documentation

## 2018-11-23 LAB — CBC
Hematocrit: 42.1 % (ref 37.5–51.0)
Hemoglobin: 13.7 g/dL (ref 13.0–17.7)
MCH: 28 pg (ref 26.6–33.0)
MCHC: 32.5 g/dL (ref 31.5–35.7)
MCV: 86 fL (ref 79–97)
Platelets: 258 10*3/uL (ref 150–450)
RBC: 4.89 x10E6/uL (ref 4.14–5.80)
RDW: 14 % (ref 11.6–15.4)
WBC: 9.1 10*3/uL (ref 3.4–10.8)

## 2018-11-23 LAB — BMP8+ANION GAP
Anion Gap: 21 mmol/L — ABNORMAL HIGH (ref 10.0–18.0)
BUN/Creatinine Ratio: 20 (ref 10–24)
BUN: 33 mg/dL — ABNORMAL HIGH (ref 8–27)
CO2: 21 mmol/L (ref 20–29)
Calcium: 9.4 mg/dL (ref 8.6–10.2)
Chloride: 98 mmol/L (ref 96–106)
Creatinine, Ser: 1.61 mg/dL — ABNORMAL HIGH (ref 0.76–1.27)
GFR calc Af Amer: 44 mL/min/{1.73_m2} — ABNORMAL LOW (ref >59)
GFR calc non Af Amer: 38 mL/min/{1.73_m2} — ABNORMAL LOW (ref >59)
Glucose: 124 mg/dL — ABNORMAL HIGH (ref 65–99)
Potassium: 4.7 mmol/L (ref 3.5–5.2)
Sodium: 140 mmol/L (ref 134–144)

## 2018-11-23 NOTE — Progress Notes (Signed)
Internal Medicine Clinic Attending  I saw and evaluated the patient.  I personally confirmed the key portions of the history and exam documented by Dr. MacLean and I reviewed pertinent patient test results.  The assessment, diagnosis, and plan were formulated together and I agree with the documentation in the resident's note.  

## 2018-11-23 NOTE — Assessment & Plan Note (Signed)
Sleep study recommended by inpatient team due to patient risk factors. Patient informed of benefit of this study. Patient declines stating he does not want this study.

## 2018-11-27 ENCOUNTER — Encounter: Payer: Self-pay | Admitting: *Deleted

## 2018-11-28 ENCOUNTER — Ambulatory Visit (INDEPENDENT_AMBULATORY_CARE_PROVIDER_SITE_OTHER): Payer: Medicare Other | Admitting: Internal Medicine

## 2018-11-28 VITALS — BP 116/60 | HR 59 | Temp 97.4°F | Ht 67.0 in | Wt 189.3 lb

## 2018-11-28 DIAGNOSIS — Z79899 Other long term (current) drug therapy: Secondary | ICD-10-CM | POA: Diagnosis not present

## 2018-11-28 DIAGNOSIS — I5042 Chronic combined systolic (congestive) and diastolic (congestive) heart failure: Secondary | ICD-10-CM | POA: Diagnosis not present

## 2018-11-28 DIAGNOSIS — Z7982 Long term (current) use of aspirin: Secondary | ICD-10-CM

## 2018-11-28 DIAGNOSIS — I5043 Acute on chronic combined systolic (congestive) and diastolic (congestive) heart failure: Secondary | ICD-10-CM | POA: Diagnosis not present

## 2018-11-28 DIAGNOSIS — Z87891 Personal history of nicotine dependence: Secondary | ICD-10-CM | POA: Diagnosis not present

## 2018-11-28 LAB — BASIC METABOLIC PANEL
Anion gap: 7 (ref 5–15)
BUN: 20 mg/dL (ref 8–23)
CO2: 28 mmol/L (ref 22–32)
Calcium: 9.2 mg/dL (ref 8.9–10.3)
Chloride: 104 mmol/L (ref 98–111)
Creatinine, Ser: 1.45 mg/dL — ABNORMAL HIGH (ref 0.61–1.24)
GFR calc Af Amer: 51 mL/min — ABNORMAL LOW (ref >60)
GFR calc non Af Amer: 44 mL/min — ABNORMAL LOW (ref >60)
Glucose, Bld: 115 mg/dL — ABNORMAL HIGH (ref 70–99)
Potassium: 4 mmol/L (ref 3.5–5.1)
Sodium: 139 mmol/L (ref 135–145)

## 2018-11-28 NOTE — Patient Instructions (Addendum)
Mr. Langton,   We will continue the same dose of furosemide. Take 1 tablet of 40 mg every other day. Continue to check your weight every day. Give [name] a call if it goes up by 3 pounds in 1 week or 5 pounds in one day.   We will call you with the results of your blood work.   Make a follow up appointment with [name] in 4 weeks or sooner if you need to.   - Dr. Frederico Hamman

## 2018-11-28 NOTE — Progress Notes (Signed)
Internal Medicine Clinic Attending  Case discussed with Dr. Santos-Sanchez at the time of the visit.  We reviewed the resident's history and exam and pertinent patient test results.  I agree with the assessment, diagnosis, and plan of care documented in the resident's note.    

## 2018-11-28 NOTE — Progress Notes (Signed)
   CC: HF follow up   HPI:  [name] is a 85 y.o. year-old male with PMH listed below who presents to clinic for HF follow up. Please see problem based assessment and plan for further details.   Past Medical History:  Diagnosis Date  . CAD (coronary artery disease)   . CHF (congestive heart failure) ([place])   . Dyslipidemia   . History of ventricular fibrillation   . HTN (hypertension)   . Hx-sudden cardiac arrest   . Ischemic cardiomyopathy   . LBBB (left bundle branch block)   . Ventricular tachycardia ([place])    Review of Systems:   Review of Systems  Constitutional: Negative for chills, fever, malaise/fatigue and weight loss.  Respiratory: Negative for cough and shortness of breath.   Cardiovascular: Negative for chest pain, palpitations, orthopnea, leg swelling and PND.  Gastrointestinal: Negative for abdominal pain.    Physical Exam:  Vitals:   11/28/18 0847  BP: 116/60  Pulse: (!) 59  Temp: (!) 97.4 F (36.3 C)  TempSrc: Oral  SpO2: 99%  Weight: 189 lb 4.8 oz (85.9 kg)  Height: 5\' 7"  (1.702 m)    General: Well-appearing elderly male in no acute distress Cardiac: regular rate and rhythm, nl S1/S2, no murmurs, rubs or gallops, no JVD  Pulm: CTAB, no wheezes or crackles, no increased work of breathing on room air  Ext: warm and well perfused, no peripheral edema   Assessment & Plan:   See Encounters Tab for problem based charting.  Patient discussed with Dr. Evette Doffing

## 2018-11-28 NOTE — Assessment & Plan Note (Signed)
Patient presents for heart failure follow-up.  He was seen last week after being discharged from the hospital at which time his Lasix dose was decreased from 40 mg daily to every other day due to a 4 lb weight loss associated with worsening renal function.  He reports feeling better since the dose change. Denies chest pain, shortness of breath, orthopnea, PND, and LE swelling. Has not had any difficulties ambulating. He is checking his weight every morning and reports it has been stable at around 180 lbs. He appears euvolemic on exam.   - Continue Lasix 40 mg every other day  - Follow up BMP to check renal function  - Has cardiology follow up in 2 weeks

## 2018-12-03 ENCOUNTER — Encounter: Payer: Self-pay | Admitting: Gastroenterology

## 2018-12-05 ENCOUNTER — Other Ambulatory Visit: Payer: Self-pay | Admitting: Internal Medicine

## 2018-12-07 ENCOUNTER — Other Ambulatory Visit: Payer: Self-pay | Admitting: *Deleted

## 2018-12-07 ENCOUNTER — Telehealth: Payer: Self-pay | Admitting: Internal Medicine

## 2018-12-07 NOTE — Patient Outreach (Signed)
[facility]) Care Management  12/07/2018  LAURENCIO SOBERS July 30, 1931 [id number]   Telephone Assessment/Screen  Referral Date: 12/05/18  Referral Source: MD referral- Jeanmarie Hubert Referral Reason: To facilitate Eliquis patient assitance program application Insurance: Next[employer] Medicare    Outreach attempt # 1 No answer. THN RN CM UNABLE TO LEAVE a HIPAA compliant voicemail message along with CM's contact info.   Plan: [facility] RN CM sent an unsuccessful outreach letter and scheduled this patient for another call attempt within 4 business days  Alando Colleran [name], RN, BSN, [place] Coordinator Office number [phone] Mobile number [phone]  Main THN number [phone] Fax number [phone]

## 2018-12-07 NOTE — Telephone Encounter (Signed)
Pt is refusing medicine for Eliquis ;pt contact [phone]

## 2018-12-07 NOTE — Telephone Encounter (Signed)
Joelene Millin from [facility] calls and states pt is adamant that he is not going to take the eliquis and he is not having a colonoscopy. She states he was nice but firm in his decision. Do you think possibly palliative care might be an option?

## 2018-12-07 NOTE — Patient Outreach (Addendum)
[facility]) Care Management  12/07/2018  Mark Nguyen 09-01-1931 [id number]  Telephone Assessment/Screen  Referral Date: 12/05/18  Referral Source: MD referral- Jeanmarie Hubert Referral Reason: To facilitate Eliquis patient assistance program application Insurance: NextGen Medicare [facility] admissions x 1  ED visits x 1 in the last 6 months  Date of Admission:11/15/2018 5:15 AM Date of Discharge:11/17/2018 Acute hypoxic respiratory failure 2/2 HFrEF exacerbation  outreach attempt 1 B  THN RN CM made another attempt to reach patient after no answer Mark Nguyen answered Mark Nguyen is [name]. This THN RN CM spoke with Mark Nguyen for EMMI general discharge referral on 11/20/18.  Mark Teo is able to verify HIPAA (DOB, Address)   [facility] RN CM informed Mark Nguyen of the reason [facility] RN CM was outreaching to him-To facilitate Eliquis patient assistance program application  Mark Haning informs [name] RN CM he does not need help with Eliquis as he does not want to take Eliquis nor xarelto. He states "I am not sure why he wants me to take it." [facility] RN CM reminded Mark Nguyen of the previous contact with the [facility] RN CM and discussed Eliquis is recommended. He was encouraged to speak with his MD about this Mark Nguyen again informs [facility] RN CM he is not wanting to take Eliquis and was considering not getting a recommended colonoscopy    [facility] RN CM sent Epic in basket message to Dr Benjamine Mola:  Dr Benjamine Mola Received referral for Mark Nguyen for Medication assistance with Eliquis Called him He is denying need of assistance as he reports he is not going to take Eliquis nor xarelto  And "I am about to not get this colonoscopy."  Thank you for your referral to [facility] and if Mark Nguyen changes his mind please feel free to refer him back to [place] RN CM called to the office of Dr Jarome Matin and left a message with Diamond Nickel to make him aware that Mark Nguyen denies need for medication assistance with Eliquis    Social: Mark Nguyen lives alone but has support of a brother, Mark Nguyen, sister, Mark Nguyen and a nephew. He continues to be independent in his care needs and continues to drive.    Conditions: Acute hypoxic respiratory failure 2/2 HFrEF exacerbation, HTN, CAD, LBBB, ischemic cardiomyopathy, ventricular tachycardia, iron deficiency anemia, HLD, atrial fibrillation,   DME: BP monitor, dentures, eyeglasses, walker, need hearing aides but can not afford on SSI fixed income  Medications: denies concerns with taking medications as prescribed, affording medications, side effects of medications and questions about medications He does mentioned informing [name] at the hospital that he would not take nor could afford Eliquis  CM referred him to the [facility] internal medicine staff for a patient assistance program prn as he refused assist from [place] on today for a [place] referral   Advance Directives: Denies need for assist with advance directives   Consent: THN RN CM reviewed [employer] services with patient. Patient gave verbal consent for services [facility] telephonic RN CM.  Plan: [facility] RN CM will close case at this time as patient has been assessed and no needs identified/needs resolved.   Pt encouraged to return a call to [place] CM prn  [facility] RN CM sent a successful outreach letter VS an unsuccessful outreach letter with [facility] brochure enclosed for review  Routed note to MDs/NP/PA   [place]. Lavina Hamman, RN, BSN, [place] Coordinator Office  number [phone] Mobile number [phone]  Main THN number [phone] Fax number [phone]

## 2018-12-07 NOTE — Patient Outreach (Signed)
[facility]) Care Management  12/07/2018  ARTHAR SICHER 14-Jul-1931 [id number]   Care coordination  Received a call from [facility] at Dr Darci Current office Reviewed the call with Mr Beichler related to his referral to [facility] for medication assistance for Eliquis and his denial of a need for assistance as he reports he does not want to take Eliquis nor Xarelto    Ajai Harville [name], RN, BSN, [place] Coordinator Office number [phone] Mobile number [phone]  Main THN number [phone] Fax number [phone]

## 2018-12-10 ENCOUNTER — Ambulatory Visit (INDEPENDENT_AMBULATORY_CARE_PROVIDER_SITE_OTHER): Payer: Medicare Other | Admitting: Student

## 2018-12-10 ENCOUNTER — Encounter: Payer: Self-pay | Admitting: Student

## 2018-12-10 ENCOUNTER — Other Ambulatory Visit: Payer: Self-pay

## 2018-12-10 ENCOUNTER — Other Ambulatory Visit: Payer: Self-pay | Admitting: *Deleted

## 2018-12-10 VITALS — BP 118/68 | HR 60 | Ht 67.0 in | Wt 188.6 lb

## 2018-12-10 DIAGNOSIS — I472 Ventricular tachycardia: Secondary | ICD-10-CM

## 2018-12-10 DIAGNOSIS — I2510 Atherosclerotic heart disease of native coronary artery without angina pectoris: Secondary | ICD-10-CM

## 2018-12-10 DIAGNOSIS — I5022 Chronic systolic (congestive) heart failure: Secondary | ICD-10-CM

## 2018-12-10 DIAGNOSIS — I4729 Other ventricular tachycardia: Secondary | ICD-10-CM

## 2018-12-10 DIAGNOSIS — I480 Paroxysmal atrial fibrillation: Secondary | ICD-10-CM

## 2018-12-10 LAB — CUP PACEART REMOTE DEVICE CHECK
Battery Remaining Longevity: 14 mo
Battery Remaining Percentage: 24 %
Battery Voltage: 2.9 V
Brady Statistic AP VP Percent: 99 %
Brady Statistic AP VS Percent: 1 %
Brady Statistic AS VP Percent: 1 %
Brady Statistic AS VS Percent: 1 %
Brady Statistic RA Percent Paced: 99 %
Date Time Interrogation Session: 20201207020016
HighPow Impedance: 48 Ohm
HighPow Impedance: 48 Ohm
Implantable Lead Implant Date: 20030319
Implantable Lead Implant Date: 20030319
Implantable Lead Implant Date: 20110211
Implantable Lead Location: 753858
Implantable Lead Location: 753859
Implantable Lead Location: 753860
Implantable Lead Model: 158
Implantable Lead Model: 4087
Implantable Lead Serial Number: 113412
Implantable Lead Serial Number: 159867
Implantable Pulse Generator Implant Date: 20160516
Lead Channel Impedance Value: 390 Ohm
Lead Channel Impedance Value: 450 Ohm
Lead Channel Impedance Value: 780 Ohm
Lead Channel Pacing Threshold Amplitude: 0.75 V
Lead Channel Pacing Threshold Amplitude: 1 V
Lead Channel Pacing Threshold Amplitude: 2.75 V
Lead Channel Pacing Threshold Pulse Width: 0.5 ms
Lead Channel Pacing Threshold Pulse Width: 1 ms
Lead Channel Pacing Threshold Pulse Width: 1 ms
Lead Channel Sensing Intrinsic Amplitude: 0.9 mV
Lead Channel Sensing Intrinsic Amplitude: 12 mV
Lead Channel Setting Pacing Amplitude: 2.5 V
Lead Channel Setting Pacing Amplitude: 2.5 V
Lead Channel Setting Pacing Amplitude: 3.25 V
Lead Channel Setting Pacing Pulse Width: 0.5 ms
Lead Channel Setting Pacing Pulse Width: 1 ms
Lead Channel Setting Sensing Sensitivity: 0.5 mV
Pulse Gen Serial Number: 7247398

## 2018-12-10 LAB — CUP PACEART INCLINIC DEVICE CHECK
Battery Remaining Longevity: 14 mo
Brady Statistic RA Percent Paced: 99 %
Brady Statistic RV Percent Paced: 99.64 %
Date Time Interrogation Session: 20201207093805
HighPow Impedance: 52.8722
Implantable Lead Implant Date: 20030319
Implantable Lead Implant Date: 20030319
Implantable Lead Implant Date: 20110211
Implantable Lead Location: 753858
Implantable Lead Location: 753859
Implantable Lead Location: 753860
Implantable Lead Model: 158
Implantable Lead Model: 4087
Implantable Lead Serial Number: 113412
Implantable Lead Serial Number: 159867
Implantable Pulse Generator Implant Date: 20160516
Lead Channel Impedance Value: 387.5 Ohm
Lead Channel Impedance Value: 462.5 Ohm
Lead Channel Impedance Value: 925 Ohm
Lead Channel Pacing Threshold Amplitude: 0.75 V
Lead Channel Pacing Threshold Amplitude: 0.75 V
Lead Channel Pacing Threshold Amplitude: 1 V
Lead Channel Pacing Threshold Amplitude: 1 V
Lead Channel Pacing Threshold Amplitude: 1.5 V
Lead Channel Pacing Threshold Pulse Width: 0.5 ms
Lead Channel Pacing Threshold Pulse Width: 0.5 ms
Lead Channel Pacing Threshold Pulse Width: 1 ms
Lead Channel Pacing Threshold Pulse Width: 1 ms
Lead Channel Pacing Threshold Pulse Width: 1 ms
Lead Channel Sensing Intrinsic Amplitude: 0.9 mV
Lead Channel Sensing Intrinsic Amplitude: 12 mV
Lead Channel Setting Pacing Amplitude: 2 V
Lead Channel Setting Pacing Amplitude: 2.5 V
Lead Channel Setting Pacing Amplitude: 2.5 V
Lead Channel Setting Pacing Pulse Width: 0.5 ms
Lead Channel Setting Pacing Pulse Width: 1 ms
Lead Channel Setting Sensing Sensitivity: 0.5 mV
Pulse Gen Serial Number: 7247398

## 2018-12-10 MED ORDER — AMIODARONE HCL 200 MG PO TABS: 200.0000 mg | ORAL_TABLET | Freq: Every day | ORAL | 3 refills | Status: DC

## 2018-12-10 NOTE — Patient Instructions (Signed)
Medication Instructions:  Amiodarone 200 mg Daily *If you need a refill on your cardiac medications before your next appointment, please call your pharmacy*  Lab Work: none If you have labs (blood work) drawn today and your tests are completely normal, you will receive your results only by: [name] MyChart Message (if you have MyChart) OR . A paper copy in the mail If you have any lab test that is abnormal or we need to change your treatment, we will call you to review the results.  Testing/Procedures: none  Follow-Up: At [facility], you and your health needs are our priority.  As part of our continuing mission to provide you with exceptional heart care, we have created designated Provider Care Teams.  These Care Teams include your primary Cardiologist (physician) and Advanced Practice Providers (APPs -  Physician Assistants and Nurse Practitioners) who all work together to provide you with the care you need, when you need it.  Your next appointment:   3 month(s)  The format for your next appointment:   In Person  Provider:   Oda Kilts, PA  Other Instructions Remote monitoring is used to monitor your  ICD from home. This monitoring reduces the number of office visits required to check your device to one time per year. It allows [name] to keep an eye on the functioning of your device to ensure it is working properly. You are scheduled for a device check from home on 12/8. You may send your transmission at any time that day. If you have a wireless device, the transmission will be sent automatically. After your physician reviews your transmission, you will receive a postcard with your next transmission date.

## 2018-12-10 NOTE — Patient Outreach (Signed)
[facility]) Care Management  12/10/2018  ODDIS JANSSENS May 13, 1931 [id number]   Opened in error    [name], RN, BSN, [place] Coordinator Office number [phone] Mobile number [phone]  Main THN number [phone] Fax number [phone]

## 2018-12-10 NOTE — Progress Notes (Signed)
PCP:  Jeanmarie Hubert, MD Primary Cardiologist: No primary care provider on file. Electrophysiologist: Dr. Janine Ores is a 85 y.o. male with past medical history of chronic systolic CHF, HLD, VT, s/p ICD insertion who presents today for routine electrophysiology followup post hospital visit. They are seen for Dr. Lovena Le.   During his admission he was diuresed 3 L and noted to have runs of VT on the monitor. His amiodarone was increased from 200 mg daily M-F, hold on weekends, back to 200 mg BID.  Lowest K that admission was 3.8 and lowest Mg was 1.8 but ? Secondary to diuresis and acute illness.    He is doing well today. He feels back to his USOH.  HE denies lightheadedness or dizziness. No SOB with his ADLs. No CP or tachypalpitations.   The patient feels that he is tolerating medications without difficulties and is otherwise without complaint today.   Device history:   Past Medical History:  Diagnosis Date  . CAD (coronary artery disease)   . CHF (congestive heart failure) ([place])   . Dyslipidemia   . History of ventricular fibrillation   . HTN (hypertension)   . Hx-sudden cardiac arrest   . Ischemic cardiomyopathy   . LBBB (left bundle branch block)   . Ventricular tachycardia [facility])    Past Surgical History:  Procedure Laterality Date  . CARDIAC CATHETERIZATION  10/12/2008  . CARDIAC DEFIBRILLATOR PLACEMENT     [employer]  . DOPPLER ECHOCARDIOGRAPHY  2008, 2011  . EP IMPLANTABLE DEVICE N/A 05/19/2014   Procedure: ICD/BIV ICD Generator Changeout;  Surgeon: Evans Lance, MD;  Location: [place] CV LAB;  Service: Cardiovascular;  Laterality: N/A;  . TONSILECTOMY, ADENOIDECTOMY, BILATERAL MYRINGOTOMY AND TUBES      Current Outpatient Medications  Medication Sig Dispense Refill  . aspirin EC 81 MG tablet Take 1 tablet (81 mg total) by mouth daily. 90 tablet 3  . atorvastatin (LIPITOR) 20 MG tablet Take 1 tablet (20 mg total) by mouth at bedtime. 90 tablet 3  .  carvedilol (COREG) 25 MG tablet Take 25 mg by mouth 2 (two) times daily with a meal.    . Cholecalciferol (VITAMIN D-3) 1000 UNITS CAPS Take 1,000 Units by mouth daily.     . furosemide (LASIX) 40 MG tablet Take 40 mg by mouth every other day.    . indomethacin (INDOCIN SR) 75 MG CR capsule Take 75 mg by mouth daily as needed (gout attacks).    . isosorbide mononitrate (IMDUR) 30 MG 24 hr tablet Take 0.5 tablets (15 mg total) by mouth daily. 45 tablet 3  . omeprazole (PRILOSEC) 20 MG capsule Take 20 mg by mouth daily.    [name] spironolactone (ALDACTONE) 25 MG tablet Take 0.5 tablets (12.5 mg total) by mouth daily. 45 tablet 3  . Tamsulosin HCl (FLOMAX) 0.4 MG CAPS Take 0.4 mg by mouth daily after breakfast.     . amiodarone (PACERONE) 200 MG tablet Take 1 tablet (200 mg total) by mouth daily. 90 tablet 3   No current facility-administered medications for this visit.     No Known Allergies  Social History   Socioeconomic History  . Marital status: Divorced    Spouse name: Not on file  . Number of children: Not on file  . Years of education: Not on file  . Highest education level: Not on file  Occupational History  . Occupation: Web designer  Social Needs  . Emergency planning/management officer  strain: Not on file  . Food insecurity    Worry: Not on file    Inability: Not on file  . Transportation needs    Medical: Not on file    Non-medical: Not on file  Tobacco Use  . Smoking status: Former Smoker    Quit date: 01/03/1985    Years since quitting: 33.9  . Smokeless tobacco: Never Used  Substance and Sexual Activity  . Alcohol use: Never    Frequency: Never  . Drug use: Never  . Sexual activity: Not on file  Lifestyle  . Physical activity    Days per week: Not on file    Minutes per session: Not on file  . Stress: Not on file  Relationships  . Social Herbalist on phone: Not on file    Gets together: Not on file    Attends religious service: Not on file    Active member  of club or organization: Not on file    Attends meetings of clubs or organizations: Not on file    Relationship status: Not on file  . Intimate partner violence    Fear of current or ex partner: Not on file    Emotionally abused: Not on file    Physically abused: Not on file    Forced sexual activity: Not on file  Other Topics Concern  . Not on file  Social History Narrative  . Not on file     Review of Systems: General: No chills, fever, night sweats or weight changes  Cardiovascular:  No chest pain, dyspnea on exertion, edema, orthopnea, palpitations, paroxysmal nocturnal dyspnea Dermatological: No rash, lesions or masses Respiratory: No cough, dyspnea Urologic: No hematuria, dysuria Abdominal: No nausea, vomiting, diarrhea, bright red blood per rectum, melena, or hematemesis Neurologic: No visual changes, weakness, changes in mental status All other systems reviewed and are otherwise negative except as noted above.  Physical Exam: Vitals:   12/10/18 0844  BP: 118/68  Pulse: 60  SpO2: 98%  Weight: 188 lb 9.6 oz (85.5 kg)  Height: 5\' 7"  (1.702 m)    GEN- The patient is well appearing, alert and oriented x 3 today.   HEENT: normocephalic, atraumatic; sclera clear, conjunctiva pink; hearing intact; oropharynx clear; neck supple, no JVP Lymph- no cervical lymphadenopathy Lungs- Clear to ausculation bilaterally, normal work of breathing.  No wheezes, rales, rhonchi Heart- Regular rate and rhythm, no murmurs, rubs or gallops, PMI not laterally displaced GI- soft, non-tender, non-distended, bowel sounds present, no hepatosplenomegaly Extremities- no clubbing, cyanosis, or edema; DP/PT/radial pulses 2+ bilaterally MS- no significant deformity or atrophy Skin- warm and dry, no rash or lesion Psych- euthymic mood, full affect Neuro- strength and sensation are intact  EKG is not ordered. Personal review of EKG from 11/19/2018 shows A-V pacing at 60 bpm, paced QRS 180 ms.   Assessment and Plan:  1. VT s/p ICD Admitted with acute hypoxic respiratory failure 11/12 to 11/17/2018 Amiodarone increased back to 200 mg BID during recent admission No further VT Will decrease amiodarone to 200 mg daily, and plan to see back in 3 months to put back on chronic dose of 1 g per week if stable.   2. Chronic systolic CHF Echo [id number] LVEF 35-40% (down from 40-45% previously) NYHA II-III symptoms at baseline Volume status stable on exam with recent medication adjustments.  Continue current medications  3. CAD Denies anginal symptoms Continue ASA and statin. On BB.   4. Paroxysmal Atrial fibrillation He  is maintaining NSR Previously taken off coumadin due to labile INR Primary care team is working on getting Eliquis/Xarelto with financial assistance. Pt current refuses due to cost [id number] of at least [place], [place]  12/10/18 9:09 AM

## 2018-12-11 ENCOUNTER — Ambulatory Visit (INDEPENDENT_AMBULATORY_CARE_PROVIDER_SITE_OTHER): Payer: Medicare Other | Admitting: *Deleted

## 2018-12-11 DIAGNOSIS — Z95810 Presence of automatic (implantable) cardiac defibrillator: Secondary | ICD-10-CM

## 2018-12-17 ENCOUNTER — Other Ambulatory Visit: Payer: Self-pay | Admitting: *Deleted

## 2018-12-17 MED ORDER — FUROSEMIDE 40 MG PO TABS: 40.0000 mg | ORAL_TABLET | ORAL | 2 refills | Status: DC

## 2018-12-17 NOTE — Telephone Encounter (Signed)
Received refill request from pt's pharmacy for furosemide 40mg  take one tab daily which is different from dose in char- furosemide 40mg  take one tab every other day.  Call made to patient-states he has been taking one every other day and has follow up appt already scheduled for 03/05/18 w/pcp.  Will send refill request to pcp for review and refill if appropriate.Despina Hidden Cassady12/14/20201:37 PM  Confirm daily vs every other day dosing

## 2018-12-25 NOTE — Addendum Note (Signed)
Addended by: Hulan Fray on: 12/25/2018 01:16 PM   Modules accepted: Orders

## 2019-01-08 ENCOUNTER — Ambulatory Visit: Payer: Medicare Other | Admitting: Gastroenterology

## 2019-01-11 NOTE — Progress Notes (Signed)
ICD remote 

## 2019-01-14 LAB — CUP PACEART INCLINIC DEVICE CHECK
Date Time Interrogation Session: 20200925154546
Implantable Lead Implant Date: 20030319
Implantable Lead Implant Date: 20030319
Implantable Lead Implant Date: 20110211
Implantable Lead Location: 753858
Implantable Lead Location: 753859
Implantable Lead Location: 753860
Implantable Lead Model: 158
Implantable Lead Model: 4087
Implantable Lead Serial Number: 113412
Implantable Lead Serial Number: 159867
Implantable Pulse Generator Implant Date: 20160516
Pulse Gen Serial Number: 7247398

## 2019-01-22 ENCOUNTER — Telehealth: Payer: Self-pay

## 2019-01-22 NOTE — Telephone Encounter (Signed)
Device alert received for ongoing AF.  LOV with AT on 12/7, Amiodarone decreased to 200mg  daily from BID.  Pt not on [place] due to cost, notes indicate pt working with PCP for patient assistance program.  Spoke with pt.  He is asymptomatic for AF,questioned about patient assistance status, it appears PCP office is no longer working on getting assistance, citing that pt not willing to take medication.  D/w pt need for [facility], he indicates he is willing to take medication if it was affordable.  Pt is willing to participate in application for patient assistance.  Advised him will forward to someone in our office that can assist with this.    [name]

## 2019-01-23 NOTE — Telephone Encounter (Signed)
Per review with patient assistance, Pt does not have part D coverage.  Outreach made to [employer] RN with [facility] to see if she can assist Pt to afford Eliquis or Xarelto.

## 2019-01-25 ENCOUNTER — Other Ambulatory Visit: Payer: Self-pay | Admitting: *Deleted

## 2019-01-25 NOTE — Patient Outreach (Signed)
[facility]) Care Management  01/25/2019  JESHAUN JEANPIERRE [birth year] [id number]   [facility] Telephone Assessment for a medication assistance referral Referral Date: 01/23/19 Referral Source: Damian Leavell, RN, [name],  RN- MD referral  Referral Reason: This Pt has Afib and should be on a blood thinner. He has not been able to take warfarin because of labile INR's.  He states he is willing to take Xarelto or Eliquis, but per Lynn's message to me (see below) he has no insurance coverage for Xarelto or Eliquis and I think he has some concerns related to cost.  Could you please see what price we can get for him?  I think he has previously refused to take anything d/t cost.  If I can do anything to assist you please let me know. Sonia Baller RN   [name] message "You will not need to call as it looks like he only has medicare [employer] but no part D coverage which is for his medications so he will be charged full price. Please see Pt Outreach phone note from 12/07/2018 (just last month) in the pts chart . Is he now willing to take Xarelto or Eliquis at this point? If so I would refer he back to Jackelyn Poling, RN with [facility] (all you need to do is send her a staffe message and they will reach out to him) as they will help him not only with this medication but all oh his medications"  Insurance: [place] attempt #1 successful  Patient is able to verify HIPAA, DOB and address Reviewed and addressed referral to [facility] with patient   Mr Ditomaso confirms he spoke with his cardiology nurse this week after it was noted his heart was "out of rhythm"pre a pacemaker alert He confirms he will agree to take Eliquis or Xarelto if it becomes affordable as he is on a fixed income of social security. He reports having friends on both medications and has been informed they pay low costs   THN RN CM discussed follow up for afib after initial [place] contact, discussed purposes  for medications during med review He agrees to follow up of [facility] RN CM for atrial fibrillation assessment after initial [place] contact   Social: Mr JIHAAD HAGG is a 86 year old divorced patient who lives alone and reports being in fair condition. He reports having assistance for his siblings prn. He has is HARD OF HEARING as confirmed by him "especially in the right ear" He continues to be independent in his care needs and continues to drive himself to his own medical appointments. He reports going out of the home to get breakfast most mornings and staying active most afternoons  Conditions: Atrial fibrillation, HTN, chronic combined diastolic and systolic heart failure, automatic implantable cardioverter defibrillator, iron deficiency anemia, hyperglycemia,  hx of sudden cardiac arrest, Hard of hearing (HOH) especially in the right ear per patient, acute, respiratory failure with hypoxia, dyslipidemia, Coronary atherosclerosis, ischemic cardiomyopathy, LBBB, ventricular Tachycardia, ventricular fibrillation, at risk for obstructive sleep apnea   DME: cane prn  Medications: Medication assistance and cost concerns are reported  Mr Taul reports he was informed his MD would send him some papers in the mail Mr Alison reports being on social security and not being able to afford high cost ("four hundred dollars") of previously offered Eliquis after last discharge. He shares that a Dr Maurene Capes had placed him on Xarelto years ago but it  was discontinued after the cost increased. He reports his total cost of medications was $500 a month now down to $300.  Reviewed his medications with him He has 11 listed During the review he questions if he should be on all of his medications. He reports side effects from his prn gout medication He reports he has not used it in a while primarily related to its side effects He reports a shot that is not cheap but not able to recall the name of the shot    Appointments: 03/05/19 Dr Aundra Dubin primary care provider (PCP) last seen 11/22/18 03/18/19 Dr Barrington Ellison PA Cardiology Last seen 12/10/18 (in Dr gregg taylor office)  Advance Directives: Denies need for assist with advance directives    Consent: THN RN CM reviewed [facility] services with patient. Patient gave verbal consent for services [facility] telephonic RN CM.   Plan: [facility] RN CM will refer Mr Disilvestro to [place] for medication assistance with Eliquis or Xarelto [facility] RN CM will follow up with Mr Rolli in the next 7-10 business days for atrial fibrillation assessment after initial [place] contact   Pt encouraged to return a call to [facility] RN CM prn  [facility] RN CM sent a successful outreach letter as discussed with [facility] brochure enclosed for review  MD involvement barrier letter to primary care provider (PCP)  Routed note to MDs/NP/PA  [facility] CM Care Plan Problem One     Most Recent Value  Care Plan Problem One  medication assistance for xarelto or eliquis   Role Documenting the Problem One  Care Management Telephonic Coordinator  Care Plan for Problem One  Active  THN Long Term Goal   over the next 31 days patient will receive assistance for xarelto or eliquis at an affordable cost as evidence by patient verbalizing on outreach call the he hs received and is taking medication as ordered  [facility] Long Term Goal Start Date  01/25/19  Interventions for Problem One Long Term Goal  completed initial referral assessment, referred to [place], letters to patient and MD  [facility] CM Short Term Goal #1   over then next 14 days patient will receive contact from [place] staff for assistance with medication cost concerns as evidence by EMR note  THN CM Short Term Goal #1 Start Date  01/25/19  Interventions for Short Term Goal #1  completed initial referral assessment, referred to [place], letters to patient and MD    [facility] CM Care Plan Problem Two     Most Recent Value  Care Plan Problem Two   Knowledge deficit of home care managment of atrial fibrillation, CHF, HTN  Role Documenting the Problem Two  Care Management Telephonic Coordinator  Care Plan for Problem Two  Active  Interventions for Problem Two Long Term Goal   discussed follow up for afib after initial [place] contact, discussed purposes for medications during med review  THN Long Term Goal  over the next 31 days patient will be able to verbalize with outreach interventions to manage his Afib, CHF & HTn at home  [place] Term Goal Start Date  01/25/19  [facility] CM Short Term Goal #1   Patient will be able to verbalize in the next 14 days  3 signs & symptoms to report to MD for Afib, htn and chf   THN CM Short Term Goal #1 Start Date  01/25/19  Interventions for Short Term Goal #2   discussed follow up for afib after  initial [place] contact, discussed purposes for medications during med review       Danyah Guastella [name], RN, BSN, [place] Coordinator Office number [phone] Mobile number [phone]  Main THN number [phone] Fax number [phone]

## 2019-01-29 ENCOUNTER — Other Ambulatory Visit: Payer: Self-pay | Admitting: Pharmacist

## 2019-01-29 NOTE — Patient Outreach (Addendum)
[facility]) Care Management  [place]   01/29/2019  Mark Nguyen [birth year] [id number]  Reason for referral: Medication Assistance  Referral source: MD referral Current insurance: Silver [employer]  PMHx includes but not limited to: CAD, atrial fibrillation not currently on anticoagulation due to cost / refusal to take warfarn, VT, CHF (EF 35-40% 11/'20) and ICM s/p ICD, HTN / HLD, IDA, recent hospitalization 11/2018 for CHF exacerbation  Outreach:  Successful telephone call with Mark Nguyen.  HIPAA identifiers verified.   Subjective:  Patient reports he has [employer] Scientist, research (medical) in [place].  He reports co-pays have gone up for Tier 1-2 medications this year but he did not understand why.  He states he was told his blood thinners would be over $400 / month but has not tried to have medications filled this year yet.    Objective: The ASCVD Risk score Mikey Bussing DC [name], et al., 2013) failed to calculate for the following reasons:   The 2013 ASCVD risk score is only valid for ages 30 to 76  Lab Results  Component Value Date   CREATININE 1.45 (H) 11/28/2018   CREATININE 1.61 (H) 11/22/2018   CREATININE 1.30 (H) 11/17/2018    Lab Results  Component Value Date   HGBA1C 5.6 11/22/2018    Lipid Panel     Component Value Date/Time   CHOL 149 11/16/2018 0519   TRIG 101 11/16/2018 0519   HDL 41 11/16/2018 0519   CHOLHDL 3.6 11/16/2018 0519   VLDL 20 11/16/2018 0519   LDLCALC 88 11/16/2018 0519    BP Readings from Last 3 Encounters:  12/10/18 118/68  11/28/18 116/60  11/22/18 117/61    No Known Allergies  Medications Reviewed Today    Reviewed by Barbaraann Faster, RN (Registered Nurse) on 01/25/19 at 1021  Med List Status: <None>  Medication Order Taking? Sig Documenting Provider Last Dose Status Informant  amiodarone (PACERONE) 200 MG tablet [id number]  Take 1 tablet (200 mg total) by mouth daily. Annamaria Helling  Active   aspirin EC 81 MG tablet [id number]  Take 1 tablet (81 mg total) by mouth daily. Evans Lance, MD  Active Self  atorvastatin (LIPITOR) 20 MG tablet [id number]  Take 1 tablet (20 mg total) by mouth at bedtime. Evans Lance, MD  Active Self  carvedilol (COREG) 25 MG tablet [id number]  Take 25 mg by mouth 2 (two) times daily with a meal. [provider]  Active   Cholecalciferol (VITAMIN D-3) 1000 UNITS CAPS [id number]  Take 1,000 Units by mouth daily.  [provider]  Active Self  furosemide (LASIX) 40 MG tablet [id number]  Take 1 tablet (40 mg total) by mouth every other day. Jeanmarie Hubert, MD  Active   indomethacin (INDOCIN SR) 75 MG CR capsule [id number]  Take 75 mg by mouth daily as needed (gout attacks). [provider]  Active Self  isosorbide mononitrate (IMDUR) 30 MG 24 hr tablet [id number]  Take 0.5 tablets (15 mg total) by mouth daily. Evans Lance, MD  Active Self  omeprazole (PRILOSEC) 20 MG capsule [id number]  Take 20 mg by mouth daily. [provider]  Active   spironolactone (ALDACTONE) 25 MG tablet [id number]  Take 0.5 tablets (12.5 mg total) by mouth daily. Evans Lance, MD  Active Self  Tamsulosin HCl (FLOMAX) 0.4 MG CAPS [id number]  Take 0.4 mg by mouth daily after breakfast.  [provider]  Active Self  Assessment: Drugs sorted by system:  Hematologic: ASA 44m  Cardiovascular: amiodarone, atorvastatin, carvedilol, furosemide, isosorbide mononitrate, spironolactone  Gastrointestinal: omeprazole  Genitourinary: tamsulosin  Vitamins/Minerals/Supplements: cholecalciferol   Miscellaneous: indomethacin  Medication Review Findings:  . MD recommends DOAC due to Afib with CHADs-VASC score 5, patient previously unwilling to consider DOAC due to cost, refuses to take coumadin any longer due to difficulty with keeping INR at goal.  Currently only on ASA 846m    Extra Help:  Not eligible for  Extra Help Low Income Subsidy based on reported income and assets   3-way call placed to [employer] Pharmacist, [facility] for [place] file.  Copay is $377 this month due to patient having to meet a $330 deductible + $47 co-pay for Tier 3 medications at the beginning of each year.   After deductible is met, co-pay should be $47 / month at any pharmacy per [place] For Tier 1 and 2 medications, insurance [facility] therefore co-pays would be less if he would transfer medications however patient reports he would like to continue to use [facility] at this time.  He is aware of [facility] if medications [facility] for Tier 1 + 2 medications.      Patient Assistance Programs: Eliquis made by BMS o Income requirement met: Yes o Out-of-pocket prescription expenditure met:   No (3% houshold income) - Patient has not met application requirements to apply for this program at this time.  - Reviewed program requirements with patient.   - If patient pays for Tier 3 deductible, then monthly co-pays, he will likely be eligible for program later this year.  Patient voiced understanding.  He is agreeable to continue to pay for Eliquis through pharmacy and then apply for patient assistance program once TROOP is met.    Plan: . Will update cardiology office re: patient starting Eliquis, will request clarification on ASA therapy . Will f/u with patient in 3-4 months re: TROOP for Eliquis PAP  CoRalene BathePharmD, BCC[place]lege [phone]

## 2019-01-31 ENCOUNTER — Other Ambulatory Visit: Payer: Self-pay | Admitting: *Deleted

## 2019-01-31 ENCOUNTER — Other Ambulatory Visit: Payer: Self-pay | Admitting: Pharmacist

## 2019-01-31 NOTE — Patient Outreach (Signed)
[facility]) Care Management  01/31/2019  Mark Nguyen [birth year] [id number]   Unsuccessful outreach for follow up for [facility] Telephone Assessment for a medication assistance referral Referral Date: 01/23/19 Referral Source: Mark Leavell, RN, Mark Lark,  RN- MD referral  Referral Reason: This Pt has Afib and should be on a blood thinner. He has not been able to take warfarin because of labile INR's.  He states he is willing to take Xarelto or Eliquis, but per Mark Nguyen's message to me (see below) he has no insurance coverage for Xarelto or Eliquis and I think he has some concerns related to cost.  Could you please see what price we can get for him?  I think he has previously refused to take anything d/t cost.  If I can do anything to assist you please let me know. Mark Baller RN   Mark Nguyen message "You will not need to call as it looks like he only has medicare [employer] but no part D coverage which is for his medications so he will be charged full price. Please see Pt Outreach phone note from 12/07/2018 (just last month) in the pts chart . Is he now willing to take Xarelto or Eliquis at this point? If so I would refer he back to Mark Poling, RN with [facility] (all you need to do is send her a staffe message and they will reach out to him) as they will help him not only with this medication but all oh his medications"  Insurance: [place] attempt #2 no successful  No answer and unable to leave voicemail message.  Social: Mark Nguyen is a 86 year old divorced patient who lives alone and reports being in fair condition. He reports having assistance for his siblings prn. He has is HARD OF HEARING as confirmed by him "especially in the right ear" He continues to be independent in his care needs and continues to drive himself to his own medical appointments. He reports going out of the home to get breakfast most mornings and staying active most  afternoons  Conditions: Atrial fibrillation, HTN, chronic combined diastolic and systolic heart failure, automatic implantable cardioverter defibrillator, iron deficiency anemia, hyperglycemia,  hx of sudden cardiac arrest, Hard of hearing (HOH) especially in the right ear per patient, acute, respiratory failure with hypoxia, dyslipidemia, Coronary atherosclerosis, ischemic cardiomyopathy, LBBB, ventricular Tachycardia, ventricular fibrillation, at risk for obstructive sleep apnea   DME: cane prn   [facility] RN CM will attempt again to follow up with Mark Nguyen in the next 7-10 business days for atrial fibrillation assessment after otedl [facility] pharmacy contact on 01/29/19   Mark Millin [name], RN, BSN, [place] Coordinator Office number [phone] Mobile number [phone]  Main THN number [phone] Fax number [phone]

## 2019-01-31 NOTE — Patient Outreach (Signed)
[facility]) Care Management  [place] 01/31/2019  NYHEEM MADURO [birth year] [id number]  Message received from RN Willeen Cass with cardiology office that patient is stop ASA when he starts Eliquis  Successful call to patient to provide update.  Patient reports he picked up Eliquis and started taking it yesterday.  He did stop ASA on his own already as he was aware of increased risk of bleeding.  Eliquis dose 5mg  BID remains appropriate as patient's SCr < 1.5, weight > 60kg.  SCr appears to be ~1.3 baseline so will need to monitor closely for dose adjustment.   Reviewed signs / symptoms of bleeding.  Reviewed Eliquis patient assistance program application requirements again and need to spend 3% of household income to qualify.  Patient voiced understanding.  He used coupon this month and will pay deductible next month for Eliquis.   Plan: Will f/u with patient in 3-4 months re: TROOP and eligibility for Eliquis PAP  Ralene Bathe, PharmD, [place] [phone]

## 2019-02-05 ENCOUNTER — Other Ambulatory Visit: Payer: Self-pay | Admitting: *Deleted

## 2019-02-05 ENCOUNTER — Other Ambulatory Visit: Payer: Self-pay

## 2019-02-05 NOTE — Patient Outreach (Addendum)
[facility]) Care Management  02/05/2019  Mark Nguyen 1931/10/17 [id number]   [facility] outreach for follow up related to a medication assistance referral Referral Date:01/23/19 Referral Source:Jennifer A Tamala Julian, RN, [name], RN- MD referral  Referral Reason:This Pt hasAfiband should be on a blood thinner. He has not been able to take warfarin because of labile INR's.  He states he is willing to take Xarelto or Eliquis, but per Lynn's message to me (see below) he has no insurance coverage for Xarelto or Eliquis and I think he has some concerns related to cost.  Could you please see what price we can get for him?  I think he has previously refused to take anything d/t cost.  If I can do anything to assist you please let me know. Sonia Baller RN   [name] message "You will not need to call as it looks like he only has medicare [employer] but no part D coverage which is for his medications so he will be charged full price. Please see Pt Outreach phone note from 12/07/2018 (just last month) in the pts chart . Is he now willing to take Xarelto or Eliquis at this point? If so I would refer he back to Jackelyn Poling, RN with [facility] (all you need to do is send her a staffe message and they will reach out to him) as they will help him not only with this medication but all oh his medications"  Insurance:[employer] Medicare    Outreach successful [name] reports today that he is doing better [facility] RN CM noted some increase breathing and slight wheezing  When assessment Mr Lady confirms he has just sat down after walking from his kitchen to the living room when the phone rang. He just ate a bowl of cornflakes. He reports an increase in weight over he weekend to 193 He took an extra dose of lasix on 02/04/19 when his weight (wt) was 189 lbs and this morning his weight was 185 lbs He reports being aware that he was at 180-182 lbs at discharge from the hospital. He is planning to take  another extra dose of lasix today. [facility] RN reviewed with him the worsening signs and symptoms (s/s) for CHF and the action plan of decreasing his sodium intake, call his Cardiologist, [facility] RN or 24 hour nurse call center. THN RN CM provided the contact numbers for his Cardiologist, [place], [place] and 24 hour nurse call center. He wrote them down. With sodium intake assessment he continues to eat bacon and canned foods but confirms he is aware of reading food labels for sodium content and had a nutrition/dietitian consult.   He is scheduled on 02/06/19 to get his second covid vaccine. He reports only soreness at the site symptoms.  THN RN CM and Mr Stansbery reviewed his upcoming MD appointments  Social: [name] is a 86 year old divorced patient who lives alone and reports being in fair condition. He reports having assistance for his siblings prn. He has is HARD OF HEARING as confirmed by him "especially in the right ear" He continues to be independent in his care needs and continues to drive himself to his own medical appointments. He reports going out of the home to get breakfast most mornings and staying active most afternoons  Conditions:Atrial fibrillation, HTN, chronic combined diastolic and systolic heart failure, automatic implantable cardioverter defibrillator, iron deficiency anemia, hyperglycemia, hx of sudden cardiac arrest, Hard of hearing (HOH) especially  in the right ear per patient, acute, respiratory failure with hypoxia, dyslipidemia, Coronary atherosclerosis, ischemic cardiomyopathy, LBBB, ventricular Tachycardia, ventricular fibrillation, at risk for obstructive sleep apnea   GGE:ZMOQ prn, scales    Plans [facility] RN CM will follow up with [name] in the next 14-21 days for further atrial fibrillation and CHF assessment after otedl [place] contacton 01/29/19 Pt encouraged to return a call to [facility] RN CM prn Routed note to MD   [facility] CM Care Plan Problem One      Most Recent Value  Care Plan Problem One  medication assistance for xarelto or eliquis   Role Documenting the Problem One  Care Management Telephonic Coordinator  Care Plan for Problem One  Active  [facility] Long Term Goal   over the next 31 days patient will receive assistance for xarelto or eliquis at an affordable cost as evidence by patient verbalizing on outreach call the he hs received and is taking medication as ordered  [facility] Long Term Goal Start Date  01/25/19  Interventions for Problem One Long Term Goal  assessed [facility] pharmacy referral status, use of medicine, s/s of Afib, reviewed afib and ChF action plans for worsening s/s  THN CM Short Term Goal #1   over then next 14 days patient will receive contact from [place] staff for assistance with medication cost concerns as evidence by EMR note  THN CM Short Term Goal #1 Start Date  01/25/19  [facility] CM Short Term Goal #1 Met Date  02/05/19    [facility] CM Care Plan Problem Two     Most Recent Value  Care Plan Problem Two  Knowledge deficit of home care managment of atrial fibrillation, CHF, HTN  Role Documenting the Problem Two  Care Management Telephonic Coordinator  Care Plan for Problem Two  Active  Interventions for Problem Two Long Term Goal   s/s of Afib, reviewed afib and ChF action plans for worsening s/s  THN Long Term Goal  over the next 31 days patient will be able to verbalize with outreach interventions to manage his Afib, CHF & HTn at home  [place] Term Goal Start Date  01/25/19  [facility] CM Short Term Goal #1   Patient will be able to verbalize in the next 14 days  3 signs & symptoms to report to MD for Afib, htn and chf   THN CM Short Term Goal #1 Start Date  01/25/19  Interventions for Short Term Goal #2   s/s of Afib, reviewed afib and ChF action plans for worsening s/s, gave contact numbers for Dr Marigene Ehlers, [facility] RNCM, [place], Assesses DME needs        [name], RN, BSN, [place]  Coordinator Office number [phone] Mobile number [phone]  Main THN number [phone] Fax number [phone]

## 2019-02-07 ENCOUNTER — Telehealth: Payer: Self-pay | Admitting: Internal Medicine

## 2019-02-07 MED ORDER — AMIODARONE HCL 200 MG PO TABS: 200.0000 mg | ORAL_TABLET | Freq: Every day | ORAL | 3 refills | Status: DC

## 2019-02-07 NOTE — Telephone Encounter (Signed)
  Pt c/o medication issue:  1. Name of Medication: amiodarone (PACERONE) 200 MG tablet  2. How are you currently taking this medication (dosage and times per day)? Once a day  3. Are you having a reaction (difficulty breathing--STAT)? no  4. What is your medication issue? Caryl Pina from [facility] calling stating the prescription they have for the patient says to take the medication Monday through Friday. She states the patient is taking it everyday so she needs a new prescription sent to them. [phone]

## 2019-02-07 NOTE — Telephone Encounter (Signed)
Sent prescription as requested.

## 2019-02-08 ENCOUNTER — Telehealth: Payer: Self-pay

## 2019-02-08 NOTE — Telephone Encounter (Signed)
ICD Alert received for ongoing AF, increased AF burden.  Pt currently prescribed Amiodaron 200mg  daily, Carvedilol 25mg  BID.    Spoke with pt, he reports he feels good, currently asymptomatic.  Pt confirms he is taking medications as ordered and as of 01/30/19 he is also taking Eliquis 5mg  BID (not on med list).    Advised will forward to MD for review.

## 2019-02-10 NOTE — Telephone Encounter (Signed)
He should be on Eliquis. GT

## 2019-02-11 MED ORDER — APIXABAN 5 MG PO TABS: 5.0000 mg | ORAL_TABLET | Freq: Two times a day (BID) | ORAL | 11 refills | Status: DC

## 2019-02-11 NOTE — Addendum Note (Signed)
Addended by: Willeen Cass A on: 02/11/2019 08:41 AM   Modules accepted: Orders

## 2019-02-19 ENCOUNTER — Other Ambulatory Visit: Payer: Self-pay | Admitting: *Deleted

## 2019-02-19 ENCOUNTER — Other Ambulatory Visit: Payer: Self-pay

## 2019-02-19 NOTE — Patient Outreach (Signed)
Mark Nguyen) Care Management  02/19/2019  [name] 03-Oct-1931 [id number]   [facility] outreach for follow up  Original Referral Date:01/23/19 Referral Source:Jennifer A Tamala Julian, RN, [name], RN- MD referral  Referral Reason: Mark Nguyen assistance with Eliquis,  Insurance:[employer] Medicare    Outreach successful Mark Nguyen reports today that he is having days of increase fluid with weight varying from 180-190 pounds He reports an episode of shortness of breath (sob) at night once   [facility] RN CM did not noted increase breathing and slight wheezing as was noted during the 02/05/19 outreach He reports his weight today was 183 Lbs after increasing his diuretic dosage He was encouraged to contact his primary MD or Cardiology office to also receive guidance frequently with any weight changes He confirms he does not add salt to food but is not reading and aware of standard sodium intake of 2300 mg daily nor is he recalling any fluid restrictions. He reports he had  various MDs discuss it with him but " I don't remember it" He states he eats out daily for breakfast at a favorite restaurant except for Sundays. He was encouraged to ask the cook or wait staff at his favorite sit down restaurant to not add salt to his food. He was encouraged to read the food labels of his foods.  THN RN CM discussed the importance of managing sodium and fluid intake along with taking his diuretic  He shared with [facility] RN CM that his breakfast generally consists of 2 eggs scrabbled or over easy, 2 slices of bacon, toast, grits or hash browns at his favorite restaurant. This past Sunday he cooked a can of salmon with eggs.  on 02/06/19 he received his second covid vaccine.    He confirms he is taking Eliquis and continues to work with [place] staff. He is not aware if he had medicare part D.  THN RN CM discussed the opportunity for him to add medicare part D during active medicare enrollment  periods with the assist of [employer] representative. He voiced interest in this process. He reports that he is scheduled to pay $314 for his next refill of Eliquis and then should have a decrease in cost   Social: Mark Nguyen is a 86 year old divorced patient who lives alone and reports being in fair condition. He reports having assistance for his siblings prn. He has is HARD OF HEARING as confirmed by him "especially in the right ear" He continues to be independent in his care needs and continues to drive himself to his own medical appointments. He reports going out of the home to get breakfast most mornings and staying active most afternoons  Conditions:Atrial fibrillation, HTN, chronic combined diastolic and systolic heart failure, automatic implantable cardioverter defibrillator, iron deficiency anemia, hyperglycemia, hx of sudden cardiac arrest, Hard of hearing (HOH) especially in the right ear per patient, acute, respiratory failure with hypoxia, dyslipidemia, Coronary atherosclerosis, ischemic cardiomyopathy, LBBB, ventricular Tachycardia, ventricular fibrillation, at risk for obstructive sleep apnea   WUJ:WJXB prn, scales  Upcoming appointments 03/05/19 internal medicine Dr Benjamine Mola 03/12/19 cardiology 03/18/19 Dr Chalmers Cater cardiology 06/11/19 cardiology  09/10/19 cardiology 12/10/19 cardiology   Plans[facility]N RN CM willfollow up with Mark Terpening in the next 14-21 days for further atrial fibrillation and CHF assessment  Pt encouraged to return a call to [facility] RN CM prn Routed note to MD [facility] CM Care Plan Problem One     Most Recent Value  Care  Plan Problem One  medication assistance for xarelto or eliquis   Role Documenting the Problem One  Care Management Telephonic Coordinator  Care Plan for Problem One  Active  THN Long Term Goal   over the next 31 days patient will receive assistance for xarelto or eliquis at an affordable cost as evidence by patient verbalizing on outreach call the he hs  received and is taking medication as ordered  [facility] Long Term Goal Start Date  01/25/19  [facility] Long Term Goal Met Date  02/20/19  THN CM Short Term Goal #1   over then next 14 days patient will receive contact from [place] staff for assistance with medication cost concerns as evidence by EMR note  THN CM Short Term Goal #1 Start Date  01/25/19  [facility] CM Short Term Goal #1 Met Date  02/05/19    [facility] CM Care Plan Problem Two     Most Recent Value  Care Plan Problem Two  Knowledge deficit of home care managment of atrial fibrillation, CHF, HTN  Role Documenting the Problem Two  Care Management Telephonic Coordinator  Care Plan for Problem Two  Active  Interventions for Problem Two Long Term Goal   encouraged to contact his primary MD or Cardiology office to also receive guidance frequently with any weight changes  encouraged to ask the cook or wait staff at his favorite sit down restaurant to not add salt to his food. He was encouraged to read the food labels of his foods.  THN RN CM discussed the importance of managing sodium and fluid intake along with taking his diuretic  THN Long Term Goal  over the next 60 days patient will be able to verbalize with outreach interventions to manage his Afib, CHF & HTn at home  [place] Term Goal Start Date  01/25/19  [facility] CM Short Term Goal #1   Patient will be able to verbalize in the next 14 days  3 signs & symptoms to report to MD for Afib, htn and chf   THN CM Short Term Goal #1 Start Date  01/25/19  [facility] CM Short Term Goal #1 Met Date   02/19/19      [name], RN, BSN, [place] Coordinator Office number [phone] Mobile number [phone]  Main THN number [phone] Fax number [phone]

## 2019-03-05 ENCOUNTER — Ambulatory Visit (INDEPENDENT_AMBULATORY_CARE_PROVIDER_SITE_OTHER): Payer: Medicare Other | Admitting: Internal Medicine

## 2019-03-05 ENCOUNTER — Other Ambulatory Visit: Payer: Self-pay

## 2019-03-05 VITALS — BP 102/65 | HR 70 | Temp 98.8°F | Wt 194.2 lb

## 2019-03-05 DIAGNOSIS — I5042 Chronic combined systolic (congestive) and diastolic (congestive) heart failure: Secondary | ICD-10-CM | POA: Diagnosis present

## 2019-03-05 DIAGNOSIS — Z79899 Other long term (current) drug therapy: Secondary | ICD-10-CM

## 2019-03-05 DIAGNOSIS — Z87891 Personal history of nicotine dependence: Secondary | ICD-10-CM | POA: Diagnosis not present

## 2019-03-05 DIAGNOSIS — I5043 Acute on chronic combined systolic (congestive) and diastolic (congestive) heart failure: Secondary | ICD-10-CM | POA: Diagnosis present

## 2019-03-05 MED ORDER — FUROSEMIDE 40 MG PO TABS: 40.0000 mg | ORAL_TABLET | Freq: Every day | ORAL | 2 refills | Status: DC

## 2019-03-05 NOTE — Patient Instructions (Addendum)
You were seen in the clinic for follow-up.  Please keep taking your Lasix as you have been, measuring your weights daily and holding a dose if your weight drops a few pounds below 185 pounds.  Please keep taking all your other medications as prescribed.  We are going to measure your electrolytes and renal function and your blood work today, and we will call you with the results.    Thank you for allowing [name] to be part of your medical care!  At your follow-up appointment with your cardiologist, please ask them about starting an ACE inhibitor (such as lisinopril, or enalapril).  These medications can help improve your heart function.

## 2019-03-05 NOTE — Progress Notes (Signed)
Internal Medicine Clinic Attending  Case discussed with Dr. MacLean at the time of the visit.  We reviewed the resident's history and exam and pertinent patient test results.  I agree with the assessment, diagnosis, and plan of care documented in the resident's note.    

## 2019-03-05 NOTE — Addendum Note (Signed)
Addended by: Larey Dresser A on: 03/05/2019 04:38 PM   Modules accepted: Level of Service

## 2019-03-05 NOTE — Progress Notes (Signed)
   CC: Follow-up for medication management  HPI: Patient is an 86 year old male with past medical history significant for systolic and diastolic heart failure (EF [id number], grade 3 diastolic dysfunction) who presents for follow-up on medication management.  [name] is a 86 y.o.   Past Medical History:  Diagnosis Date  . CAD (coronary artery disease)   . CHF (congestive heart failure) ([place])   . Dyslipidemia   . History of ventricular fibrillation   . HTN (hypertension)   . Hx-sudden cardiac arrest   . Ischemic cardiomyopathy   . LBBB (left bundle branch block)   . Ventricular tachycardia ([place])    Review of Systems:   Review of Systems  Constitutional: Negative for chills and fever.  HENT: Negative for congestion.   Respiratory: Negative for cough and shortness of breath.   Cardiovascular: Negative for chest pain.  Gastrointestinal: Negative for abdominal pain, nausea and vomiting.  All other systems reviewed and are negative.    Physical Exam:  Vitals:   03/05/19 0924  BP: 102/65  Pulse: 70  Temp: 98.8 F (37.1 C)  TempSrc: Oral  SpO2: 99%  Weight: 194 lb 3.2 oz (88.1 kg)   Physical Exam  Constitutional: He is well-developed, well-nourished, and in no distress.  HENT:  Head: Normocephalic and atraumatic.  Eyes: EOM are normal. Right eye exhibits no discharge. Left eye exhibits no discharge.  Neck: No tracheal deviation present.  Cardiovascular: Normal rate and regular rhythm. Exam reveals no gallop and no friction rub.  No murmur heard. Pulmonary/Chest: Effort normal and breath sounds normal. No respiratory distress. He has no wheezes. He has no rales.  Abdominal: Soft. He exhibits no distension. There is no abdominal tenderness. There is no rebound and no guarding.  Musculoskeletal:        General: No tenderness, deformity or edema. Normal range of motion.     Cervical back: Normal range of motion.  Neurological: He is alert. Coordination normal.  Skin:  Skin is warm and dry. No rash noted. He is not diaphoretic. No erythema.  Psychiatric: Memory and judgment normal.     Assessment & Plan:   See Encounters Tab for problem based charting.  Patient discussed with Dr. Lynnae January

## 2019-03-05 NOTE — Assessment & Plan Note (Addendum)
Patient presents for heart failure follow-up.  Patient reports that he weighs himself daily without clothes and his goal weight is 185 pounds.  He takes Lasix 40 mg every day, holds dosage of his weight is less than 183 pounds.  Patient states he feels well, denies chest pain, shortness of breath, orthopnea, lower extremity swelling.  Patient denies difficulties ambulating.  Patient is on disease modifying medications of carvedilol and spironolactone.  *Continue Lasix 40 mg every day, hold for weight less than 133 pounds *We will check BMP, electrolytes *Patient with cardiology follow-up on 03/18/2019. *Patient may benefit from ACE inhibitor.  Enalapril was stopped in 2016 for unclear reason.  Patient does have low-normal blood pressure, may benefit from low dosage.  Patient wishes to hold off for now, discuss with cardiologist.  Message sent to cardiologist

## 2019-03-06 ENCOUNTER — Other Ambulatory Visit: Payer: Self-pay | Admitting: *Deleted

## 2019-03-06 ENCOUNTER — Telehealth: Payer: Self-pay | Admitting: Internal Medicine

## 2019-03-06 ENCOUNTER — Encounter: Payer: Self-pay | Admitting: *Deleted

## 2019-03-06 LAB — BMP8+ANION GAP
Anion Gap: 15 mmol/L (ref 10.0–18.0)
BUN/Creatinine Ratio: 15 (ref 10–24)
BUN: 22 mg/dL (ref 8–27)
CO2: 25 mmol/L (ref 20–29)
Calcium: 9.1 mg/dL (ref 8.6–10.2)
Chloride: 102 mmol/L (ref 96–106)
Creatinine, Ser: 1.49 mg/dL — ABNORMAL HIGH (ref 0.76–1.27)
GFR calc Af Amer: 49 mL/min/{1.73_m2} — ABNORMAL LOW (ref >59)
GFR calc non Af Amer: 42 mL/min/{1.73_m2} — ABNORMAL LOW (ref >59)
Glucose: 116 mg/dL — ABNORMAL HIGH (ref 65–99)
Potassium: 4 mmol/L (ref 3.5–5.2)
Sodium: 142 mmol/L (ref 134–144)

## 2019-03-06 NOTE — Patient Outreach (Signed)
[facility]) Care Management  03/06/2019  THADE SLEDZ 1931/02/11 [id number]   THN unsuccessful outreach for follow up   Outreach attempt to the home number  No answer. THN RN CM left HIPAA [facility] Portability and Accountability Act) compliant voicemail message along with CM's contact info.   Plan: [facility] RN CM scheduled this patient for another call attempt within 4-7  business days  Mechel Haggard [name], RN, BSN, [place] Coordinator Office number [phone] Mobile number [phone]  Main THN number [phone] Fax number [phone]

## 2019-03-06 NOTE — Telephone Encounter (Signed)
Received call regarding Pt's blood pressure.  Question if Pt blood pressure is too low.  Advised Pt's blood pressure is low normal and without symptoms it is a good blood pressure.  Pt is not having any symptoms of low blood pressure.  In 3 way call with Joelene Millin and Pt.  Explained above.  Discussed purpose of different medications Pt is taking.  Pt is concerned about adding another medication (see PCP note) because he is on "7 different medications".  Advised that Pt should discuss at appt on 03/18/19 with AT.    Pt indicates understanding.  He would like NOT to start another medication.  All questions answered.  No further needs at this time.

## 2019-03-06 NOTE — Patient Outreach (Signed)
[facility]) Care Management  03/06/2019  Mark Nguyen Apr 15, 1931 [id number]  THNoutreach for follow up Original Referral Date:01/23/19 Referral Source:Jennifer A Tamala Julian, RN, [name], RN- MD referral  Referral Reason: [place] assistance with Eliquis,  Insurance:Next[employer] Medicare  Patient is able to verify HIPAA ([place] and Accountability Act) identifiers, date of birth (DOB) and address Reviewed with him the reason for follow up      [name] [facility] to [facility] with [facility] pharmacy staff related to Eliquis cost concerns  He wants an earlier call from pharmacy staff vs May 2021  He reports he has paid $482 of $600 for Eliquis as of 03/06/19 He voiced concern with Eliquis side effects  THN RN CM reviewed side effects of Eliquis to include chest pain, bleeding, bruising, dizziness, difficulty breathing THN RN CM sent an Epic in basket message to [place] requesting an earlier outreach  [name] questions if his medications are causing low BP (BP that was taken at his [facility] urology appointment on 03/06/19 was 102/65). He voiced concern that his "medicines are working against one another"   Conferenced with [name] to Dr Lovena Le office to leave a message for MD RN with Jeanette Caprice  Return call from [facility] Cardiology RN and [facility] RN CM completed a conference call to [name] to allow him time to review his concerns and to get answers to his questions He voices concern about low blood pressure (BP), polypharmacy and not wanting to start a new medication Patrici Ranks encourages [name] to discuss his concerns also with cardiologist during 03/18/19 appointment  Mr Langlois reports he does not check his blood pressure at home and was encouraged to purchase a BP cuff.   Appointments 03/18/19 Cardiology   [facility] RN CM will follow up with [name] within the next business day 28-35 business days for further assessment for care  coordination and disease management  Pt encouraged to return a call to [facility] RN CM prn  THN RN CM sent a EMMI education outreach letter with EMMI materials for EMMI for low blood pressure, Dealing with low blood pressure from the drugs you take, [facility] consent form with return envelope and know before you go sheet enclosed for review  Routed note to MDs/NP/PA  [facility] CM Care Plan Problem One     Most Recent Value  Care Plan Problem One  medication assistance for xarelto or eliquis   Role Documenting the Problem One  Care Management Telephonic Coordinator  Care Plan for Problem One  Active  THN Long Term Goal   over the next 31 days patient will receive assistance for xarelto or eliquis at an affordable cost as evidence by patient verbalizing on outreach call the he hs received and is taking medication as ordered  [facility] Long Term Goal Start Date  01/25/19  [facility] Long Term Goal Met Date  02/20/19  THN CM Short Term Goal #1   over then next 14 days patient will receive contact from [place] staff for assistance with medication cost concerns as evidence by EMR note  THN CM Short Term Goal #1 Start Date  01/25/19  [facility] CM Short Term Goal #1 Met Date  02/05/19    [facility] CM Care Plan Problem Two     Most Recent Value  Care Plan Problem Two  Knowledge deficit of home care managment of atrial fibrillation, CHF, HTN  Role Documenting the Problem Two  Care Management Telephonic Coordinator  Care Plan for Problem Two  Active  Interventions for Problem Two Long Term Goal   assessed for worsening symptoms of CHF reviewed CHF action plans, Answered questions about [facility] pharmacy, sent message to [place], conferenced with [name] to cardiology to allow him time to review his concerns and to get answers to his questions, encouraged purchase of a BP cuff and discussed the importance of monitoring his BP at home. sent EMMI education on low BP, before you go sheet   THN Long Term Goal  over the next 60 days patient  will be able to verbalize with outreach interventions to manage his Afib, CHF & HTn at home  [place] Term Goal Start Date  01/25/19  [facility] CM Short Term Goal #1   Patient will be able to verbalize in the next 14 days  3 signs & symptoms to report to MD for Afib, htn and chf   THN CM Short Term Goal #1 Start Date  01/25/19  [facility] CM Short Term Goal #1 Met Date   02/19/19       [name], RN, BSN, [place] Coordinator Office number [phone] Mobile number [phone]  Main THN number [phone] Fax number [phone]

## 2019-03-06 NOTE — Telephone Encounter (Signed)
Pt c/o BP issue: STAT if pt c/o blurred vision, one-sided weakness or slurred speech  1. What are your last 5 BP readings?  03/05/19: 102/55  2. Are you having any other symptoms (ex. Dizziness, headache, blurred vision, passed out)? No  3. What is your BP issue? Mark Nguyen, with [place] states the patient has been experiencing symptoms of low blood pressure and she is requesting to speak with Dr. Tanna Furry nurse to discuss. Please return call to [place] at [phone].

## 2019-03-08 ENCOUNTER — Other Ambulatory Visit: Payer: Self-pay | Admitting: Pharmacist

## 2019-03-08 NOTE — Patient Outreach (Signed)
[facility]) Care Management  [place] 03/08/2019  Mark Nguyen [birth year] [id number]  Communication received from [facility] RN that patient is requesting call from pharmacist.   Successful call to patient.  He remains very confused regarding application process for Eliquis and states "things keep changing."  We reviewed again the 3% out-of-pocket expenditure requirement from patient assistance program company and that this 3% is calculated from his annual income which INCLUDES his premium that is deducted from monthly paycheck.  I will have application mailed to patient so he can read information directly and he can call me if he still has questions about the requirement.  Ralene Bathe, PharmD, [place] [phone]

## 2019-03-11 ENCOUNTER — Other Ambulatory Visit: Payer: Self-pay | Admitting: Pharmacy Technician

## 2019-03-11 NOTE — Patient Outreach (Addendum)
[facility]) Care Management  03/11/2019  Mark Nguyen 29-Dec-1931 [id number]                                       Medication Assistance Referral  Referral From: [place]: Eliquis / [place] Patient application portion:  Mailed [name] application portion: Faxed  to Dr. Beckie Salts Jeniffer Culliver address/fax verified via: Office website   Follow up:  Will follow up with patient in 10-14 business days to confirm application(s) have been received.  Maud Deed Chana Bode Cornelius Certified Pharmacy Technician [place] Management Direct Dial:[phone]

## 2019-03-12 ENCOUNTER — Ambulatory Visit: Payer: Self-pay | Admitting: *Deleted

## 2019-03-12 ENCOUNTER — Telehealth: Payer: Self-pay

## 2019-03-12 ENCOUNTER — Ambulatory Visit (INDEPENDENT_AMBULATORY_CARE_PROVIDER_SITE_OTHER): Payer: Medicare Other | Admitting: *Deleted

## 2019-03-12 DIAGNOSIS — Z95810 Presence of automatic (implantable) cardiac defibrillator: Secondary | ICD-10-CM | POA: Diagnosis not present

## 2019-03-12 LAB — CUP PACEART REMOTE DEVICE CHECK
Battery Remaining Longevity: 19 mo
Battery Remaining Percentage: 27 %
Battery Voltage: 2.87 V
Brady Statistic AP VP Percent: 99 %
Brady Statistic AP VS Percent: 1 %
Brady Statistic AS VP Percent: 1 %
Brady Statistic AS VS Percent: 1 %
Brady Statistic RA Percent Paced: 37 %
Date Time Interrogation Session: 20210309020025
HighPow Impedance: 49 Ohm
HighPow Impedance: 49 Ohm
Implantable Lead Implant Date: 20030319
Implantable Lead Implant Date: 20030319
Implantable Lead Implant Date: 20110211
Implantable Lead Location: 753858
Implantable Lead Location: 753859
Implantable Lead Location: 753860
Implantable Lead Model: 158
Implantable Lead Model: 4087
Implantable Lead Serial Number: 113412
Implantable Lead Serial Number: 159867
Implantable Pulse Generator Implant Date: 20160516
Lead Channel Impedance Value: 390 Ohm
Lead Channel Impedance Value: 490 Ohm
Lead Channel Impedance Value: 790 Ohm
Lead Channel Pacing Threshold Amplitude: 0.75 V
Lead Channel Pacing Threshold Amplitude: 1 V
Lead Channel Pacing Threshold Amplitude: 2.375 V
Lead Channel Pacing Threshold Pulse Width: 0.5 ms
Lead Channel Pacing Threshold Pulse Width: 1 ms
Lead Channel Pacing Threshold Pulse Width: 1 ms
Lead Channel Sensing Intrinsic Amplitude: 0.5 mV
Lead Channel Sensing Intrinsic Amplitude: 12 mV
Lead Channel Setting Pacing Amplitude: 2.5 V
Lead Channel Setting Pacing Amplitude: 2.5 V
Lead Channel Setting Pacing Amplitude: 2.875
Lead Channel Setting Pacing Pulse Width: 0.5 ms
Lead Channel Setting Pacing Pulse Width: 1 ms
Lead Channel Setting Sensing Sensitivity: 0.5 mV
Pulse Gen Serial Number: 7247398

## 2019-03-12 NOTE — Telephone Encounter (Signed)
Faxed as requested

## 2019-03-12 NOTE — Telephone Encounter (Signed)
**Note De-Identified Natascha Edmonds Obfuscation** Provider page of a BMS pt asst application received Auryn Paige fax from Etter Sjogren, [place] with [facility]. I have completed the page, scanned and emailed it to Dr Forde Dandy nurse with request to obtain Dr Tanna Furry signature, date and fax back to [place] at [phone].

## 2019-03-13 NOTE — Progress Notes (Signed)
ICD Remote  

## 2019-03-15 NOTE — Progress Notes (Signed)
Electrophysiology Office Note Date: 03/18/2019  ID:  Mark Nguyen, DOB March 13, 1931, MRN [id number]  PCP: Jeanmarie Hubert, MD Electrophysiologist: Cristopher Peru, MD   CC: Routine ICD follow-up  Mark Nguyen is a 86 y.o. male seen today for Dr. Lovena Le.  They present today for routine electrophysiology followup.  Since last being seen in our clinic, the patient reports doing well. He has has not had as much energy, possibly related to being in AF. Today he is in NSR. He denies chest pain, palpitations, dyspnea, PND, orthopnea, nausea, vomiting, dizziness, syncope, edema, weight gain, or early satiety.  He has not had ICD shocks.       He is taking Eliquis, but the cost is > $300 and he is not sure how long he can sustain this. He reports being told he would get financial assistance and be ~ $50 after co-pay.    Device History: StMudlogger ICD implanted 2003, [place] upgrade 2011, gen change 05/2014 for CHF History of appropriate therapy: Yes History of AAD therapy: Yes   Past Medical History:  Diagnosis Date  . CAD (coronary artery disease)   . CHF (congestive heart failure) ([place])   . Dyslipidemia   . History of ventricular fibrillation   . HTN (hypertension)   . Hx-sudden cardiac arrest   . Ischemic cardiomyopathy   . LBBB (left bundle branch block)   . Ventricular tachycardia [facility])    Past Surgical History:  Procedure Laterality Date  . CARDIAC CATHETERIZATION  10/12/2008  . CARDIAC DEFIBRILLATOR PLACEMENT     St Jude  . DOPPLER ECHOCARDIOGRAPHY  2008, 2011  . EP IMPLANTABLE DEVICE N/A 05/19/2014   Procedure: ICD/BIV ICD Generator Changeout;  Surgeon: Evans Lance, MD;  Location: [facility];  Service: Cardiovascular;  Laterality: N/A;  . TONSILECTOMY, ADENOIDECTOMY, BILATERAL MYRINGOTOMY AND TUBES      Current Outpatient Medications  Medication Sig Dispense Refill  . amiodarone (PACERONE) 200 MG tablet Take 1 tablet (200 mg total) by mouth daily. 90  tablet 3  . apixaban (ELIQUIS) 5 MG TABS tablet Take 1 tablet (5 mg total) by mouth 2 (two) times daily. 60 tablet 11  . atorvastatin (LIPITOR) 20 MG tablet Take 1 tablet (20 mg total) by mouth at bedtime. 90 tablet 3  . carvedilol (COREG) 25 MG tablet Take 25 mg by mouth 2 (two) times daily with a meal.    . Cholecalciferol (VITAMIN D-3) 1000 UNITS CAPS Take 1,000 Units by mouth daily.     . furosemide (LASIX) 40 MG tablet Take 1 tablet (40 mg total) by mouth daily. Hold dosage if weight < 183 pounds 30 tablet 2  . indomethacin (INDOCIN SR) 75 MG CR capsule Take 75 mg by mouth daily as needed (gout attacks).    . isosorbide mononitrate (IMDUR) 30 MG 24 hr tablet Take 0.5 tablets (15 mg total) by mouth daily. 45 tablet 3  . omeprazole (PRILOSEC) 20 MG capsule Take 20 mg by mouth daily.    [name] spironolactone (ALDACTONE) 25 MG tablet Take 0.5 tablets (12.5 mg total) by mouth daily. 45 tablet 3  . Tamsulosin HCl (FLOMAX) 0.4 MG CAPS Take 0.4 mg by mouth daily after breakfast.     . losartan (COZAAR) 25 MG tablet Take 0.5 tablets 12.5 mg at Bedtime 90 tablet 3   No current facility-administered medications for this visit.    Allergies:   Patient has no known allergies.   Social History: Social History  Socioeconomic History  . Marital status: Divorced    Spouse name: Not on file  . Number of children: Not on file  . Years of education: Not on file  . Highest education level: Not on file  Occupational History  . Occupation: Web designer  Tobacco Use  . Smoking status: Former Smoker    Quit date: 01/03/1985    Years since quitting: 34.2  . Smokeless tobacco: Never Used  Substance and Sexual Activity  . Alcohol use: Never  . Drug use: Never  . Sexual activity: Not on file  Other Topics Concern  . Not on file  Social History Narrative  . Not on file   Social Determinants of Health   Financial Resource Strain:   . Difficulty of Paying Living Expenses:   Food Insecurity:  No Food Insecurity  . Worried About Charity fundraiser in the Last Year: Never true  . Ran Out of Food in the Last Year: Never true  Transportation Needs: No Transportation Needs  . Lack of Transportation (Medical): No  . Lack of Transportation (Non-Medical): No  Physical Activity:   . Days of Exercise per Week:   . Minutes of Exercise per Session:   Stress:   . Feeling of Stress :   Social Connections:   . Frequency of Communication with Friends and Family:   . Frequency of Social Gatherings with Friends and Family:   . Attends Religious Services:   . Active Member of Clubs or Organizations:   . Attends Archivist Meetings:   [name] Marital Status:   Intimate Partner Violence:   . Fear of Current or Ex-Partner:   . Emotionally Abused:   [name] Physically Abused:   . Sexually Abused:     Family History: Family History  Problem Relation Age of Onset  . Coronary artery disease Other     Review of Systems: All other systems reviewed and are otherwise negative except as noted above.   Physical Exam: Vitals:   03/18/19 1221  BP: 118/60  Pulse: 62  SpO2: 97%  Weight: 193 lb 12.8 oz (87.9 kg)  Height: '5\' 7"'  (1.702 m)     GEN- The patient is well appearing, alert and oriented x 3 today.   HEENT: normocephalic, atraumatic; sclera clear, conjunctiva pink; hearing intact; oropharynx clear; neck supple, no JVP Lymph- no cervical lymphadenopathy Lungs- Clear to ausculation bilaterally, normal work of breathing.  No wheezes, rales, rhonchi Heart- Regular rate and rhythm, no murmurs, rubs or gallops, PMI not laterally displaced GI- soft, non-tender, non-distended, bowel sounds present, no hepatosplenomegaly Extremities- no clubbing, cyanosis, or edema; DP/PT/radial pulses 2+ bilaterally MS- no significant deformity or atrophy Skin- warm and dry, no rash or lesion; ICD pocket well healed Psych- euthymic mood, full affect Neuro- strength and sensation are intact  ICD  interrogation- reviewed in detail today,  See PACEART report  EKG:  EKG is not ordered today.  Recent Labs: 11/15/2018: ALT 22; B Natriuretic Peptide 725.7; TSH 1.581 11/17/2018: Magnesium 2.5 11/22/2018: Hemoglobin 13.7; Platelets 258 03/05/2019: BUN 22; Creatinine, Ser 1.49; Potassium 4.0; Sodium 142   Wt Readings from Last 3 Encounters:  03/18/19 193 lb 12.8 oz (87.9 kg)  03/05/19 194 lb 3.2 oz (88.1 kg)  12/10/18 188 lb 9.6 oz (85.5 kg)     Other studies Reviewed: Additional studies/ records that were reviewed today include: Echo 11/15/2018 showed LVEF 35-40%, previous office notes, previous labs, previous remotes  Assessment and Plan:  1.  Chronic  systolic dysfunction s/p St. Jude CRT-D  euvolemic today Stable on an appropriate medical regimen Normal ICD function See Pace Art report No changes today No ACE/ARB on board. Add losartan 12.5 mg qhs. Repeat BMET 2 weeks. He has labile BP at times, and I do not think he would tolerate Entresto; at least not at this time.   2. VT No interim VT Continue current dose Amiodarone. Surveillance labs today.  Surveillance labs today.   3. CAD Denies anginal symptoms  4. Persistent AF He recently converted on amiodarone Surveillance labs today.  Continue eliquis for CHA2DS2VASC of 5    5. CKD III Follow with addition of ARB  Current medicines are reviewed at length with the patient today.   The patient does not have concerns regarding his medicines.  The following changes were made today:  Adding losartan  Labs/ tests ordered today include:  Orders Placed This Encounter  Procedures  . Comp Met (CMET)  . CBC  . Magnesium  . TSH  . Basic Metabolic Panel (BMET)  . CUP PACEART INCLINIC DEVICE CHECK   Disposition:   Follow up with Me in 3 months.   Jacalyn Lefevre, PA-C  03/18/2019 12:54 PM  [address] [place] [address] [phone] (office) [phone]  (fax)

## 2019-03-18 ENCOUNTER — Ambulatory Visit (INDEPENDENT_AMBULATORY_CARE_PROVIDER_SITE_OTHER): Payer: Medicare Other | Admitting: Student

## 2019-03-18 ENCOUNTER — Encounter: Payer: Self-pay | Admitting: Student

## 2019-03-18 ENCOUNTER — Other Ambulatory Visit: Payer: Self-pay

## 2019-03-18 VITALS — BP 118/60 | HR 62 | Ht 67.0 in | Wt 193.8 lb

## 2019-03-18 DIAGNOSIS — I5042 Chronic combined systolic (congestive) and diastolic (congestive) heart failure: Secondary | ICD-10-CM | POA: Diagnosis not present

## 2019-03-18 DIAGNOSIS — I5022 Chronic systolic (congestive) heart failure: Secondary | ICD-10-CM | POA: Diagnosis not present

## 2019-03-18 DIAGNOSIS — I10 Essential (primary) hypertension: Secondary | ICD-10-CM | POA: Diagnosis not present

## 2019-03-18 DIAGNOSIS — I480 Paroxysmal atrial fibrillation: Secondary | ICD-10-CM

## 2019-03-18 LAB — CUP PACEART INCLINIC DEVICE CHECK
Battery Remaining Longevity: 20 mo
Brady Statistic RA Percent Paced: 35 %
Brady Statistic RV Percent Paced: 92 %
Date Time Interrogation Session: 20210315125028
HighPow Impedance: 47.3196
Implantable Lead Implant Date: 20030319
Implantable Lead Implant Date: 20030319
Implantable Lead Implant Date: 20110211
Implantable Lead Location: 753858
Implantable Lead Location: 753859
Implantable Lead Location: 753860
Implantable Lead Model: 158
Implantable Lead Model: 4087
Implantable Lead Serial Number: 113412
Implantable Lead Serial Number: 159867
Implantable Pulse Generator Implant Date: 20160516
Lead Channel Impedance Value: 375 Ohm
Lead Channel Impedance Value: 487.5 Ohm
Lead Channel Impedance Value: 762.5 Ohm
Lead Channel Pacing Threshold Amplitude: 0.75 V
Lead Channel Pacing Threshold Amplitude: 0.75 V
Lead Channel Pacing Threshold Amplitude: 1.25 V
Lead Channel Pacing Threshold Amplitude: 1.25 V
Lead Channel Pacing Threshold Amplitude: 1.625 V
Lead Channel Pacing Threshold Pulse Width: 0.5 ms
Lead Channel Pacing Threshold Pulse Width: 0.5 ms
Lead Channel Pacing Threshold Pulse Width: 1 ms
Lead Channel Pacing Threshold Pulse Width: 1 ms
Lead Channel Pacing Threshold Pulse Width: 1 ms
Lead Channel Sensing Intrinsic Amplitude: 1.2 mV
Lead Channel Sensing Intrinsic Amplitude: 12 mV
Lead Channel Setting Pacing Amplitude: 2.125
Lead Channel Setting Pacing Amplitude: 2.5 V
Lead Channel Setting Pacing Amplitude: 2.5 V
Lead Channel Setting Pacing Pulse Width: 0.5 ms
Lead Channel Setting Pacing Pulse Width: 1 ms
Lead Channel Setting Sensing Sensitivity: 0.5 mV
Pulse Gen Serial Number: 7247398

## 2019-03-18 MED ORDER — LOSARTAN POTASSIUM 25 MG PO TABS: ORAL_TABLET | ORAL | 3 refills | Status: DC

## 2019-03-18 NOTE — Patient Instructions (Signed)
Medication Instructions:  START LOSARTAN 12.5 mg 1/2 tablet at BedTime *If you need a refill on your cardiac medications before your next appointment, please call your pharmacy*   Lab Work:  TODAY CMET MAGNESIUM CBC TSH  Please schedule BMET to be done in 2 WEEKS If you have labs (blood work) drawn today and your tests are completely normal, you will receive your results only by: [name] MyChart Message (if you have MyChart) OR . A paper copy in the mail If you have any lab test that is abnormal or we need to change your treatment, we will call you to review the results.   Testing/Procedures: none   Follow-Up: At [facility], you and your health needs are our priority.  As part of our continuing mission to provide you with exceptional heart care, we have created designated Provider Care Teams.  These Care Teams include your primary Cardiologist (physician) and Advanced Practice Providers (APPs -  Physician Assistants and Nurse Practitioners) who all work together to provide you with the care you need, when you need it.  We recommend signing up for the patient portal called "MyChart".  Sign up information is provided on this After Visit Summary.  MyChart is used to connect with patients for Virtual Visits (Telemedicine).  Patients are able to view lab/test results, encounter notes, upcoming appointments, etc.  Non-urgent messages can be sent to your provider as well.   To learn more about what you can do with MyChart, go to [email or web address].    Your next appointment:   3 month(s)  The format for your next appointment:   In Person  Provider:   Oda Kilts, PA   Other Instructions Remote monitoring is used to monitor your  ICD from home. This monitoring reduces the number of office visits required to check your device to one time per year. It allows [name] to keep an eye on the functioning of your device to ensure it is working properly. You are scheduled for a device check from  home on 06/11/19. You may send your transmission at any time that day. If you have a wireless device, the transmission will be sent automatically. After your physician reviews your transmission, you will receive a postcard with your next transmission date.

## 2019-03-19 ENCOUNTER — Telehealth: Payer: Self-pay

## 2019-03-19 LAB — COMPREHENSIVE METABOLIC PANEL
ALT: 25 IU/L (ref 0–44)
AST: 34 IU/L (ref 0–40)
Albumin/Globulin Ratio: 2.1 (ref 1.2–2.2)
Albumin: 4.4 g/dL (ref 3.6–4.6)
Alkaline Phosphatase: 69 IU/L (ref 39–117)
BUN/Creatinine Ratio: 14 (ref 10–24)
BUN: 21 mg/dL (ref 8–27)
Bilirubin Total: 0.5 mg/dL (ref 0.0–1.2)
CO2: 24 mmol/L (ref 20–29)
Calcium: 9.2 mg/dL (ref 8.6–10.2)
Chloride: 103 mmol/L (ref 96–106)
Creatinine, Ser: 1.46 mg/dL — ABNORMAL HIGH (ref 0.76–1.27)
GFR calc Af Amer: 50 mL/min/{1.73_m2} — ABNORMAL LOW (ref >59)
GFR calc non Af Amer: 43 mL/min/{1.73_m2} — ABNORMAL LOW (ref >59)
Globulin, Total: 2.1 g/dL (ref 1.5–4.5)
Glucose: 109 mg/dL — ABNORMAL HIGH (ref 65–99)
Potassium: 3.8 mmol/L (ref 3.5–5.2)
Sodium: 144 mmol/L (ref 134–144)
Total Protein: 6.5 g/dL (ref 6.0–8.5)

## 2019-03-19 LAB — CBC
Hematocrit: 41.4 % (ref 37.5–51.0)
Hemoglobin: 13 g/dL (ref 13.0–17.7)
MCH: 27.8 pg (ref 26.6–33.0)
MCHC: 31.4 g/dL — ABNORMAL LOW (ref 31.5–35.7)
MCV: 89 fL (ref 79–97)
Platelets: 207 10*3/uL (ref 150–450)
RBC: 4.68 x10E6/uL (ref 4.14–5.80)
RDW: 14 % (ref 11.6–15.4)
WBC: 6.7 10*3/uL (ref 3.4–10.8)

## 2019-03-19 LAB — TSH: TSH: 2.96 u[IU]/mL (ref 0.450–4.500)

## 2019-03-19 LAB — MAGNESIUM: Magnesium: 1.9 mg/dL (ref 1.6–2.3)

## 2019-03-19 MED ORDER — MAGNESIUM OXIDE 400 MG PO CAPS: 400.0000 mg | ORAL_CAPSULE | Freq: Every day | ORAL | 3 refills | Status: DC

## 2019-03-19 NOTE — Telephone Encounter (Signed)
The patient has been notified of the lab result and verbalized understanding.  All questions (if any) were answered. Frederik Schmidt, RN 03/19/2019 11:26 AM

## 2019-03-19 NOTE — Telephone Encounter (Signed)
-----   Message from Shirley Friar, PA-C sent at 03/19/2019 10:48 AM EDT ----- Magnesium borderline low given history of VT.   Can we please start Mag ox 400 mg daily.   Thank you!

## 2019-03-22 ENCOUNTER — Other Ambulatory Visit: Payer: Self-pay | Admitting: Pharmacist

## 2019-03-22 NOTE — Patient Outreach (Signed)
[facility])  [place] Team    [facility] pharmacy case will be closed as our team is transitioning from the [place] Management Department into the [facility] Quality Department and will no longer be using [facility] for documentation purposes.     [facility] pharmacy technician will continue to assist with medication assistance program applications.    Ralene Bathe, PharmD, [place] [phone]

## 2019-03-27 ENCOUNTER — Other Ambulatory Visit: Payer: Self-pay | Admitting: Internal Medicine

## 2019-04-01 ENCOUNTER — Other Ambulatory Visit: Payer: Medicare Other | Admitting: *Deleted

## 2019-04-01 ENCOUNTER — Other Ambulatory Visit: Payer: Self-pay

## 2019-04-01 DIAGNOSIS — I5022 Chronic systolic (congestive) heart failure: Secondary | ICD-10-CM

## 2019-04-01 DIAGNOSIS — I10 Essential (primary) hypertension: Secondary | ICD-10-CM

## 2019-04-01 DIAGNOSIS — I5042 Chronic combined systolic (congestive) and diastolic (congestive) heart failure: Secondary | ICD-10-CM

## 2019-04-01 DIAGNOSIS — I480 Paroxysmal atrial fibrillation: Secondary | ICD-10-CM

## 2019-04-01 LAB — BASIC METABOLIC PANEL
BUN/Creatinine Ratio: 15 (ref 10–24)
BUN: 22 mg/dL (ref 8–27)
CO2: 22 mmol/L (ref 20–29)
Calcium: 8.9 mg/dL (ref 8.6–10.2)
Chloride: 103 mmol/L (ref 96–106)
Creatinine, Ser: 1.47 mg/dL — ABNORMAL HIGH (ref 0.76–1.27)
GFR calc Af Amer: 50 mL/min/{1.73_m2} — ABNORMAL LOW (ref >59)
GFR calc non Af Amer: 43 mL/min/{1.73_m2} — ABNORMAL LOW (ref >59)
Glucose: 137 mg/dL — ABNORMAL HIGH (ref 65–99)
Potassium: 4.6 mmol/L (ref 3.5–5.2)
Sodium: 143 mmol/L (ref 134–144)

## 2019-04-10 ENCOUNTER — Other Ambulatory Visit: Payer: Self-pay | Admitting: *Deleted

## 2019-04-10 ENCOUNTER — Other Ambulatory Visit: Payer: Self-pay

## 2019-04-10 NOTE — Patient Outreach (Addendum)
[facility]) Care Management  04/10/2019  Mark Nguyen Mar 06, 1931 [id number]   [facility] monthlyoutreach for follow up OriginalReferral Date:01/23/19 Referral Source:Mark Charmayne Sheer, RN, Mark Lark, RN- MD referral  Referral Reason:[facility] pharmacy assistance withEliquis,  Insurance:NextGen Medicare  Patient is able to verify HIPAA ([place] and Accountability Act) identifiers, date of birth (DOB) and address Reviewed with him the reason for follow up    Eliquis  [name] continues to work with [facility] pharmacy staff related to Eliquis cost concerns  During the last [facility] RN CM outreach on 03/06/19 he requested an earlier call from [place] staff vs May 2021 He reports he had paid $482 of $600 for Eliquis as of 03/06/19  [facility] RN CM notes he was contacted by a [place] staff on 03/08/19 who reviewed again the 3% out of pocket expenditure requirement form patient assistance program company and the 3% calculated from his annual income. An application was mailed to him for him to read and he had been encouraged to return a call to the pharmacist, Mark Nguyen with questions Today he refers to the received [facility] letter and [facility] RN CM encouraged him to read the letter to [facility] RN CM BUT he NEVER found the letter and read it. [facility] RN CM encouraged him x 3 to follow the instructions in the letter as requested to complete his application process.He still presented with some confusion [facility]N RN CM left a message for the [place] staff Mark Nguyen to return a call to the patient while on the phone with [name] [name] was also encouraged to take the letter to his cardiologist for possible assistance to understand or complete the process [facility] RN CM explained x 2 to [name] that he does not have to go to an office to see any [facility] staff members and that all interactions are telephonic as this [facility] RN CM outreaches have been with him.  04/10/19 [address] collaborated  with [facility] pharmacist Mark Nguyen to review [name] voiced concerns on the outreach. [name] to be outreached by [place] staff with attempt to answer further questions 04/10/19 [facility] pharmacist updated [facility] RN CM that [name] had been contacted and is to complete and return the application with the information requested. He will have a [facility] outreach from [place] to assess where he is with the 3% out of pocket requirement.   Magnesium side effects [name] questions if his magnesium is causing some diarrhea and "gas" He reports he is taking "half" of the Magnesium 400 mg ordered  He reports he is having less gas and GI symptoms with the half dosage He reports not informing his provider of the change [facility] RN CM encouraged him to update his provider, Dr Mark Nguyen Discussed the importance of reporting worsening symptoms and changes in medication usage [facility]N RN CM sent an epic in basket message to Mark Nguyen, [facility] with pt permission to update on pt decrease dosage of magnesium related to reported side effects   Atrial fibrillation  [name] when assessed denied worsening signs and symptoms (s/s)  Hypertension (HTN) [name] reports he has started to check his blood pressure at home when he previously had informed [facility] RN CM he did not have a BP cuff.  He reports he is not sure about the values as "they go up and down" He is not able to actually tell [facility] RN CM the  actual values today  THN RN CM explained to him the BP values do change each time it is checked but [facility] RN CM could not provide an answer to him without his actual values  [facility] RN CM discussed calibration of BP machines, the importance of using the correct cuff size and returning the BP cuff if he feels it is truly not working after taking it to his MD office and comparing the values He voiced understanding   Wt - 188 lbs today He confirmed he walked this morning but does not have set pattern for exercising at home   Covid vaccine He confirms he has received 2 doses of the covid vaccine in March 2021   Falls He states none since last outreach  Loss of his son in the last two weeks "I got the blahs" [facility] RN CM offered condolences. [facility] RN CM discussed [facility] SW services for grief counseling but he refused the offer and stated he has his brother, sister and good neighbors to assist him    Appointments [name] voices he has a follow up with Dr Mark Nguyen he believes on 05/05/19   [facility] RN CM will follow up with [name] within the next business day 28-35 business days for further assessment for care coordination and disease management  Pt encouraged to return a call to [facility] RN CM prn  [facility]N RN CM sent a EMMI education outreach letter with EMMI materials for EMMI for low blood pressure, Dealing with low blood pressure from the drugs you take, [facility] consent form with return envelope and know before you go sheet enclosed for review  Routed note to MDs/NP/PA [facility] CM Care Plan Problem One     Most Recent Value  Care Plan Problem One  medication assistance for xarelto or eliquis   Role Documenting the Problem One  Care Management Telephonic Coordinator  Care Plan for Problem One  Active  THN Long Term Goal   over the next 31 days patient will receive assistance for xarelto or eliquis at an affordable cost as evidence by patient verbalizing on outreach call the he hs received and is taking medication as ordered  [facility] Long Term Goal Start Date  01/25/19  [facility] Long Term Goal Met Date  02/20/19  THN CM Short Term Goal #1   over then next 14 days patient will receive contact from [place] staff for assistance with medication cost concerns as evidence by EMR note  THN CM Short Term Goal #1 Start Date  01/25/19  [facility] CM Short Term Goal #1 Met Date  02/05/19    [facility] CM Care Plan Problem Two     Most Recent Value  Care Plan Problem Two  Knowledge deficit of home care managment of atrial fibrillation, CHF, HTN  Role  Documenting the Problem Two  Care Management Telephonic Coordinator  Care Plan for Problem Two  Active  Interventions for Problem Two Long Term Goal   Assessed for worsening symptoms and concerns about medical issues, attempted to assist with explaining medication assistance process, encouraged him to follow the instructions of the [facility] pharmacist per a letter received, left a message for [facility] pharmacist while speaking with pt, encouraged pt to speak with pharmacist and cardiology providers, colaborated with [facility] pharmacist related to pt voiced concerns today  THN Long Term Goal  over the next 60 days patient will be able to verbalize with outreach interventions to manage his Afib, CHF & HTn at home  [place]  Term Goal Start Date  01/25/19  [facility] CM Short Term Goal #1   Patient will be able to verbalize in the next 14 days  3 signs & symptoms to report to MD for Afib, htn and chf   THN CM Short Term Goal #1 Start Date  01/25/19  [facility] CM Short Term Goal #1 Met Date   02/19/19  THN CM Short Term Goal #2   over the next 45 days patient will continue with decreased GI symptoms from magnesium as voiced during future outreach  [facility] CM Short Term Goal #2 Start Date  04/11/19  Interventions for Short Term Goal #2  allowed pt time to ventilate his concerns with magnesium, discussed importance of reporting side effects to MD office and any change in dosage of medications, sent epic in basket message to MD with pt permission     [name], RN, BSN, [place] Coordinator Office number [phone] Mobile number [phone]  Main THN number [phone] Fax number [phone]

## 2019-04-17 ENCOUNTER — Other Ambulatory Visit: Payer: Self-pay | Admitting: Pharmacy Technician

## 2019-04-17 NOTE — Patient Outreach (Signed)
[facility]) Care Management  04/17/2019  HOAI VIGEN 12-13-1931 [id number]   Received patient portion(s) of patient assistance application(s) for Eliquis. Faxed completed application and required documents into [employer].  Will follow up with company(ies) in 5-7 business days to check status of application(s).  Maud Deed Chana Bode, [place] Certified Pharmacy Technician [employer]

## 2019-04-23 ENCOUNTER — Encounter: Payer: Self-pay | Admitting: *Deleted

## 2019-05-15 ENCOUNTER — Other Ambulatory Visit: Payer: Self-pay | Admitting: Internal Medicine

## 2019-05-15 DIAGNOSIS — I5022 Chronic systolic (congestive) heart failure: Secondary | ICD-10-CM

## 2019-05-20 ENCOUNTER — Ambulatory Visit: Payer: Medicare Other | Admitting: Pharmacist

## 2019-05-21 ENCOUNTER — Other Ambulatory Visit: Payer: Self-pay | Admitting: Pharmacy Technician

## 2019-05-21 NOTE — Patient Outreach (Signed)
[facility])   05/21/2019  Mark Nguyen Jul 30, 1931 [id number]   Incoming in basket message from [name] stating that patient contacted her to inform her that he was approved for Eliquis and that he received his first shipment on Saturday.  Will remove myself from care team  Maud Deed. Chana Bode, [place] Certified Pharmacy Technician [employer]

## 2019-06-10 ENCOUNTER — Other Ambulatory Visit: Payer: Self-pay

## 2019-06-10 ENCOUNTER — Other Ambulatory Visit: Payer: Self-pay | Admitting: *Deleted

## 2019-06-10 ENCOUNTER — Encounter: Payer: Self-pay | Admitting: *Deleted

## 2019-06-10 NOTE — Patient Outreach (Signed)
[facility]) Care Management  06/10/2019  Mark Nguyen [birth year] [id number]    [facility] outreach to MD referred patient for complex care services  [name] was referred to [facility] by Damian Leavell, RN, [name], RN from MD office on 01/23/19 for [place] assistance with [facility] pharmacy assistance was provided and issues resolved  [name] is now being followed for [facility] complex care (coordination and disease management   Outreach attempt unsuccessful No answer at the home number (the only number listed for patient in [place])  [facility] RN CM left HIPAA ([place] and Accountability Act) compliant voicemail message along with CM's contact info.   Plan: [facility] RN CM scheduled this patient for another call attempt within 4-7  business days  Davida Falconi [name], RN, BSN, [place] Coordinator Office number [phone] Mobile number [phone]  Main THN number [phone] Fax number [phone]

## 2019-06-10 NOTE — Patient Outreach (Signed)
[facility]) Care Management  06/10/2019  Mark Nguyen 1931/09/17 [id number]   [facility] outreach to MD referred patient for complex care services  [name] was referred to [facility] by Mark Leavell, RN, Mark Lark, RN from MD office on 01/23/19 for [place] assistance with [facility] pharmacy assistance was provided and issues resolved  [name] is now being followed for [facility] complex care (coordination and disease management  Patient returned a call to [facility] RN CM Patient is able to verify HIPAA, DOB and Address   Reviewed reason for follow up/care coordination call to [facility] with patient He is doing good  He report he has only noted issues with his alllergies He and [facility] RN CM discussed monitoring for pollen and weeds He likes to work out in the garden at his home that he and his brothers maintain. He reports this is where he was when [facility] RN CM called earlier He reports "I work in it a little and then rest a little. Cause I can only do so much at a time " [facility] RN CM cautioned him about the heat and dehydration. Rest breaks were commended  Discussed new urologist (medicare) [facility] RN CM reviewed medicare.gov to offer a list of urologists at [place] Drs D Comer Locket, Dr Phebe Colla and Dr Tawny Asal He denies worsening symptoms and states he will try the office prn   CHF/atrial fibrillation  denies chest pain  He reports doing well with use of his diuretic for 2+ months He reports a weight of 186-190 lb. He reports his average "normal" weight is 190 Lbs  Discussed the importance of follow up appointments to his MDs every 3-6 months as recommended, labs and checking kidney and liver  THN services  [facility] RN CM discussed [facility] health coach services  [name] voices he prefers [facility] RN CM to follow up with him.  He agrees to quarterly follow ups and to be able to call [facility] RN CM prn    Medications: Eliquis He states he did call to [place]  staff after no call on May 20 2019 He sent in his application He now has a 3 month supply  Magnesium - he feels is was causing him diarrhea and "no energy"-stopped taking about 2 weeks  He reports he has spoken with "others and it did the same with them"  Encouraged him to call the MD office to report this He states he plans to at the next office visit   Appointments: June 18 2019 cardiology, [place] check defibrillator  He is aware that the cardiology nurse had mention 09/10/19 cardiology 12/10/19 cardiology  [facility] RN CM will follow up with [name] within the next 90-97 business days as he agreed during this outreach Pt encouraged to return a call to [facility] RN CM prn Routed note to MD  [facility] CM Care Plan Problem One     Most Recent Value  Care Plan Problem One  medication assistance for xarelto or eliquis   Role Documenting the Problem One  Care Management Telephonic Coordinator  Care Plan for Problem One  Active  [facility] Long Term Goal   over the next 31 days patient will receive assistance for xarelto or eliquis at an affordable cost as evidence by patient verbalizing on outreach call the he hs received and is taking medication as ordered  [facility] Long Term Goal Start Date  01/25/19  [facility]  Long Term Goal Met Date  02/20/19  THN CM Short Term Goal #1   over then next 14 days patient will receive contact from [place] staff for assistance with medication cost concerns as evidence by EMR note  THN CM Short Term Goal #1 Start Date  01/25/19  [facility] CM Short Term Goal #1 Met Date  02/05/19    [facility] CM Care Plan Problem Two     Most Recent Value  Care Plan Problem Two  Knowledge deficit of home care managment of atrial fibrillation, CHF, HTN  Role Documenting the Problem Two  Care Management Telephonic Coordinator  Care Plan for Problem Two  Active  Interventions for Problem Two Long Term Goal   assessed for chf, afib worsening symptoms, confirmed he received EMMIs, discussed allergy care, discussed  importance of MD follow up compliance , discussed heat and dehydration, offered names of urologists, commened him on home care for afib and chf monitoring   THN Long Term Goal  over the next 60 days patient will be able to verbalize with outreach interventions to manage his Afib, CHF & HTn at home  [place] Term Goal Start Date  01/25/19  [facility] CM Short Term Goal #1   Patient will be able to verbalize in the next 14 days  3 signs & symptoms to report to MD for Afib, htn and chf   THN CM Short Term Goal #1 Start Date  01/25/19  [facility] CM Short Term Goal #1 Met Date   02/19/19  THN CM Short Term Goal #2   over the next 45 days patient will continue with decreased GI symptoms from magnesium as voiced during future outreach  [facility] CM Short Term Goal #2 Start Date  04/11/19  [facility] CM Short Term Goal #2 Met Date  06/10/19      [name], RN, BSN, [place] Coordinator Office number [phone] Mobile number [phone]  Main THN number [phone] Fax number [phone]

## 2019-06-11 ENCOUNTER — Ambulatory Visit (INDEPENDENT_AMBULATORY_CARE_PROVIDER_SITE_OTHER): Payer: Medicare Other | Admitting: *Deleted

## 2019-06-11 ENCOUNTER — Telehealth: Payer: Self-pay | Admitting: *Deleted

## 2019-06-11 DIAGNOSIS — I442 Atrioventricular block, complete: Secondary | ICD-10-CM | POA: Diagnosis not present

## 2019-06-11 LAB — CUP PACEART REMOTE DEVICE CHECK
Battery Remaining Longevity: 13 mo
Battery Remaining Percentage: 23 %
Battery Voltage: 2.8 V
Brady Statistic AP VP Percent: 99 %
Brady Statistic AP VS Percent: 1 %
Brady Statistic AS VP Percent: 1 %
Brady Statistic AS VS Percent: 1 %
Brady Statistic RA Percent Paced: 97 %
Date Time Interrogation Session: 20210608020018
HighPow Impedance: 48 Ohm
HighPow Impedance: 48 Ohm
Implantable Lead Implant Date: 20030319
Implantable Lead Implant Date: 20030319
Implantable Lead Implant Date: 20110211
Implantable Lead Location: 753858
Implantable Lead Location: 753859
Implantable Lead Location: 753860
Implantable Lead Model: 158
Implantable Lead Model: 4087
Implantable Lead Serial Number: 113412
Implantable Lead Serial Number: 159867
Implantable Pulse Generator Implant Date: 20160516
Lead Channel Impedance Value: 390 Ohm
Lead Channel Impedance Value: 550 Ohm
Lead Channel Impedance Value: 750 Ohm
Lead Channel Pacing Threshold Amplitude: 0.75 V
Lead Channel Pacing Threshold Amplitude: 1.25 V
Lead Channel Pacing Threshold Amplitude: 2.5 V
Lead Channel Pacing Threshold Pulse Width: 0.5 ms
Lead Channel Pacing Threshold Pulse Width: 1 ms
Lead Channel Pacing Threshold Pulse Width: 1 ms
Lead Channel Sensing Intrinsic Amplitude: 0.5 mV
Lead Channel Sensing Intrinsic Amplitude: 12 mV
Lead Channel Setting Pacing Amplitude: 2.5 V
Lead Channel Setting Pacing Amplitude: 2.5 V
Lead Channel Setting Pacing Amplitude: 3 V
Lead Channel Setting Pacing Pulse Width: 0.5 ms
Lead Channel Setting Pacing Pulse Width: 1 ms
Lead Channel Setting Sensing Sensitivity: 0.5 mV
Pulse Gen Serial Number: 7247398

## 2019-06-11 NOTE — Telephone Encounter (Signed)
Scheduled transmission from 06/11/19 reveals increased AT/AF burden since ~06/09/19. EGMs appear A-flutter/AF with intermittent atrial undersensing and resulting AP events. V rates controlled denies any awareness of atrial arrhythmias. Reports he has been able to work in his garden as usual. Reports compliance with cardiac medications other than magnesium, which he stopped a few weeks ago on his own due to GI upset. Denies missed doses of Eliquis or amiodarone. Pt aware of upcoming appointment with A. T[place], PA-C, on 06/18/19. He is aware to call with any new concerns in the interim and denies questions at this time.

## 2019-06-12 NOTE — Progress Notes (Signed)
Remote ICD transmission.   

## 2019-06-17 NOTE — Progress Notes (Signed)
Mark Nguyen!   Electrophysiology Office Note Date: 06/18/2019  ID:  Mark Nguyen, DOB 05/25/1931, MRN [id number]  PCP: Jeanmarie Hubert, MD Primary Cardiologist: No primary care provider on file. Electrophysiologist: Cristopher Peru, MD   CC: Routine ICD follow-up  Mark Nguyen is a 86 y.o. male seen today for Cristopher Peru, MD for routine electrophysiology followup.  Since last being seen in our clinic the patient reports doing OK overall. He has noticed less energy, so was not surprised to find out he has been in AF). He has mild dyspnea with moderate exertion. He denies chest pain, palpitations, PND, orthopnea, nausea, vomiting, dizziness, syncope, edema, weight gain, or early satiety. He has not had ICD shocks.   Device History: StMudlogger ICD implanted 2003, BiV upgrade 2011, gen change 05/2014 for CHF History of appropriate therapy: Yes History of AAD therapy: Yes; currently on amiodarone   Past Medical History:  Diagnosis Date  . CAD (coronary artery disease)   . CHF (congestive heart failure) ([place])   . Dyslipidemia   . History of ventricular fibrillation   . HTN (hypertension)   . Hx-sudden cardiac arrest   . Ischemic cardiomyopathy   . LBBB (left bundle branch block)   . Ventricular tachycardia [facility])    Past Surgical History:  Procedure Laterality Date  . CARDIAC CATHETERIZATION  10/12/2008  . CARDIAC DEFIBRILLATOR PLACEMENT     St Jude  . DOPPLER ECHOCARDIOGRAPHY  2008, 2011  . EP IMPLANTABLE DEVICE N/A 05/19/2014   Procedure: ICD/BIV ICD Generator Changeout;  Surgeon: Evans Lance, MD;  Location: [facility];  Service: Cardiovascular;  Laterality: N/A;  . TONSILECTOMY, ADENOIDECTOMY, BILATERAL MYRINGOTOMY AND TUBES      Current Outpatient Medications  Medication Sig Dispense Refill  . amiodarone (PACERONE) 200 MG tablet Take 1 tablet (200 mg total) by mouth daily. 90 tablet 3  . apixaban (ELIQUIS) 5 MG TABS tablet Take 1 tablet (5 mg total) by  mouth 2 (two) times daily. 60 tablet 11  . atorvastatin (LIPITOR) 20 MG tablet TAKE 1 TABLET BY MOUTH DAILY AT BEDTIME 90 tablet 3  . carvedilol (COREG) 25 MG tablet Take 25 mg by mouth 2 (two) times daily with a meal.    . Cholecalciferol (VITAMIN D-3) 1000 UNITS CAPS Take 1,000 Units by mouth daily.     . furosemide (LASIX) 40 MG tablet Take 1 tablet (40 mg total) by mouth daily. Hold dosage if weight < 183 pounds 30 tablet 2  . indomethacin (INDOCIN SR) 75 MG CR capsule Take 75 mg by mouth daily as needed (gout attacks).    . isosorbide mononitrate (IMDUR) 30 MG 24 hr tablet Take 0.5 tablets (15 mg total) by mouth daily. 45 tablet 3  . losartan (COZAAR) 25 MG tablet Take 0.5 tablets 12.5 mg at Bedtime 90 tablet 3  . omeprazole (PRILOSEC) 20 MG capsule Take 20 mg by mouth daily.    . phenylephrine (SUDAFED PE) 10 MG TABS tablet Take 10 mg by mouth as needed (congestion).    [name] spironolactone (ALDACTONE) 25 MG tablet TAKE 1/2 TABLET BY MOUTH ONCE A DAY. 45 tablet 2  . Tamsulosin HCl (FLOMAX) 0.4 MG CAPS Take 0.4 mg by mouth daily after breakfast.      No current facility-administered medications for this visit.    Allergies:   Patient has no known allergies.   Social History: Social History   Socioeconomic History  . Marital status: Divorced    Spouse name: Not  on file  . Number of children: Not on file  . Years of education: Not on file  . Highest education level: Not on file  Occupational History  . Occupation: Web designer  Tobacco Use  . Smoking status: Former Smoker    Quit date: 01/03/1985    Years since quitting: 34.4  . Smokeless tobacco: Never Used  Vaping Use  . Vaping Use: Never used  Substance and Sexual Activity  . Alcohol use: Never  . Drug use: Never  . Sexual activity: Not on file  Other Topics Concern  . Not on file  Social History Narrative  . Not on file   Social Determinants of Health   Financial Resource Strain:   . Difficulty of Paying  Living Expenses:   Food Insecurity: No Food Insecurity  . Worried About Charity fundraiser in the Last Year: Never true  . Ran Out of Food in the Last Year: Never true  Transportation Needs: No Transportation Needs  . Lack of Transportation (Medical): No  . Lack of Transportation (Non-Medical): No  Physical Activity:   . Days of Exercise per Week:   . Minutes of Exercise per Session:   Stress:   . Feeling of Stress :   Social Connections:   . Frequency of Communication with Friends and Family:   . Frequency of Social Gatherings with Friends and Family:   . Attends Religious Services:   . Active Member of Clubs or Organizations:   . Attends Archivist Meetings:   [name] Marital Status:   Intimate Partner Violence:   . Fear of Current or Ex-Partner:   . Emotionally Abused:   [name] Physically Abused:   . Sexually Abused:    Family History: Family History  Problem Relation Age of Onset  . Coronary artery disease Other    Review of Systems: All other systems reviewed and are otherwise negative except as noted above.  Physical Exam: Vitals:   06/18/19 1018  BP: 118/68  Pulse: 72  SpO2: 96%  Weight: 195 lb (88.5 kg)  Height: 5\' 7"  (1.702 m)    GEN- The patient is well appearing, alert and oriented x 3 today.   HEENT: normocephalic, atraumatic; sclera clear, conjunctiva pink; hearing intact; oropharynx clear; neck supple, no JVP Lymph- no cervical lymphadenopathy Lungs- Clear to ausculation bilaterally, normal work of breathing.  No wheezes, rales, rhonchi Heart- Regular rate and rhythm, no murmurs, rubs or gallops, PMI not laterally displaced GI- soft, non-tender, non-distended, bowel sounds present, no hepatosplenomegaly Extremities- no clubbing, cyanosis, or edema; DP/PT/radial pulses 2+ bilaterally MS- no significant deformity or atrophy Skin- warm and dry, no rash or lesion; ICD pocket well healed Psych- euthymic mood, full affect Neuro- strength and sensation are  intact  ICD interrogation- reviewed in detail today,  See PACEART report  EKG:  EKG is ordered today. The ekg ordered today shows V pacing with atrial fibrillation underlying, controlled rate at 71 bpm  Recent Labs: 11/15/2018: B Natriuretic Peptide 725.7 03/18/2019: ALT 25; Hemoglobin 13.0; Magnesium 1.9; Platelets 207; TSH 2.960 04/01/2019: BUN 22; Creatinine, Ser 1.47; Potassium 4.6; Sodium 143   Wt Readings from Last 3 Encounters:  06/18/19 195 lb (88.5 kg)  06/10/19 190 lb (86.2 kg)  03/18/19 193 lb 12.8 oz (87.9 kg)     Other studies Reviewed: Additional studies/ records that were reviewed today include: Previous EP office notes, recent remotes.   Assessment and Plan:  1.  Chronic systolic dysfunction s/p St. Jude  CRT-D  euvolemic today Stable on an appropriate medical regimen Normal ICD function See Pace Art report No changes today  2. VT No interim VT  3. CAD Denies ischemic symptoms  4. Persistent AF Continue eliquis for CHA2DS2VASC of 5   Recently noted to have undersensing of his AF by remote 06/11/2019. Sensitivity fixed to 0.3 mV today.    Continue amiodarone  Recent surveillance labs stable He has had increased fatigue, and unclear true AF burden in the setting of undersensing. Will plan DCCV, and then should be able to follow his burden better with sensitivity adjustments.   5. CKD III Stable with addition of losartan  Current medicines are reviewed at length with the patient today.   The patient does not have concerns regarding his medicines.  The following changes were made today:  none  Labs/ tests ordered today include:  Orders Placed This Encounter  Procedures  . Basic Metabolic Panel (BMET)  . CBC w/Diff  . Magnesium  . CUP PACEART [place]  . EKG 12-Lead   Disposition:   Follow up with AF clinic 2 weeks post DCCV. Continue remotes.   Jacalyn Lefevre, PA-C  06/18/2019 11:29 AM  [facility] [address] [name] [address] [phone] (office) [phone] (fax)

## 2019-06-18 ENCOUNTER — Ambulatory Visit (INDEPENDENT_AMBULATORY_CARE_PROVIDER_SITE_OTHER): Payer: Medicare Other | Admitting: Student

## 2019-06-18 ENCOUNTER — Encounter: Payer: Self-pay | Admitting: Student

## 2019-06-18 ENCOUNTER — Other Ambulatory Visit: Payer: Self-pay

## 2019-06-18 VITALS — BP 118/68 | HR 72 | Ht 67.0 in | Wt 195.0 lb

## 2019-06-18 DIAGNOSIS — Z01812 Encounter for preprocedural laboratory examination: Secondary | ICD-10-CM | POA: Diagnosis not present

## 2019-06-18 DIAGNOSIS — I5022 Chronic systolic (congestive) heart failure: Secondary | ICD-10-CM

## 2019-06-18 DIAGNOSIS — I5042 Chronic combined systolic (congestive) and diastolic (congestive) heart failure: Secondary | ICD-10-CM | POA: Diagnosis not present

## 2019-06-18 DIAGNOSIS — I480 Paroxysmal atrial fibrillation: Secondary | ICD-10-CM

## 2019-06-18 DIAGNOSIS — Z95810 Presence of automatic (implantable) cardiac defibrillator: Secondary | ICD-10-CM

## 2019-06-18 DIAGNOSIS — I4729 Other ventricular tachycardia: Secondary | ICD-10-CM

## 2019-06-18 DIAGNOSIS — I472 Ventricular tachycardia: Secondary | ICD-10-CM

## 2019-06-18 LAB — CUP PACEART INCLINIC DEVICE CHECK
Battery Remaining Longevity: 15 mo
Brady Statistic RA Percent Paced: 89 %
Brady Statistic RV Percent Paced: 98 %
Date Time Interrogation Session: 20210615110357
HighPow Impedance: 46.5776
Implantable Lead Implant Date: 20030319
Implantable Lead Implant Date: 20030319
Implantable Lead Implant Date: 20110211
Implantable Lead Location: 753858
Implantable Lead Location: 753859
Implantable Lead Location: 753860
Implantable Lead Model: 158
Implantable Lead Model: 4087
Implantable Lead Serial Number: 113412
Implantable Lead Serial Number: 159867
Implantable Pulse Generator Implant Date: 20160516
Lead Channel Impedance Value: 375 Ohm
Lead Channel Impedance Value: 487.5 Ohm
Lead Channel Impedance Value: 737.5 Ohm
Lead Channel Pacing Threshold Amplitude: 0.75 V
Lead Channel Pacing Threshold Amplitude: 0.75 V
Lead Channel Pacing Threshold Amplitude: 2.625 V
Lead Channel Pacing Threshold Pulse Width: 0.5 ms
Lead Channel Pacing Threshold Pulse Width: 0.5 ms
Lead Channel Pacing Threshold Pulse Width: 1 ms
Lead Channel Sensing Intrinsic Amplitude: 0.8 mV
Lead Channel Sensing Intrinsic Amplitude: 12 mV
Lead Channel Setting Pacing Amplitude: 2.5 V
Lead Channel Setting Pacing Amplitude: 2.5 V
Lead Channel Setting Pacing Amplitude: 3.125
Lead Channel Setting Pacing Pulse Width: 0.5 ms
Lead Channel Setting Pacing Pulse Width: 1 ms
Lead Channel Setting Sensing Sensitivity: 0.5 mV
Pulse Gen Serial Number: 7247398

## 2019-06-18 LAB — CBC WITH DIFFERENTIAL/PLATELET
Basophils Absolute: 0.1 10*3/uL (ref 0.0–0.2)
Basos: 1 %
EOS (ABSOLUTE): 0.2 10*3/uL (ref 0.0–0.4)
Eos: 2 %
Hematocrit: 35.6 % — ABNORMAL LOW (ref 37.5–51.0)
Hemoglobin: 11.7 g/dL — ABNORMAL LOW (ref 13.0–17.7)
Immature Grans (Abs): 0 10*3/uL (ref 0.0–0.1)
Immature Granulocytes: 0 %
Lymphocytes Absolute: 1.4 10*3/uL (ref 0.7–3.1)
Lymphs: 18 %
MCH: 28.4 pg (ref 26.6–33.0)
MCHC: 32.9 g/dL (ref 31.5–35.7)
MCV: 86 fL (ref 79–97)
Monocytes Absolute: 0.7 10*3/uL (ref 0.1–0.9)
Monocytes: 9 %
Neutrophils Absolute: 5.4 10*3/uL (ref 1.4–7.0)
Neutrophils: 70 %
Platelets: 193 10*3/uL (ref 150–450)
RBC: 4.12 x10E6/uL — ABNORMAL LOW (ref 4.14–5.80)
RDW: 14.6 % (ref 11.6–15.4)
WBC: 7.6 10*3/uL (ref 3.4–10.8)

## 2019-06-18 LAB — BASIC METABOLIC PANEL
BUN/Creatinine Ratio: 13 (ref 10–24)
BUN: 16 mg/dL (ref 8–27)
CO2: 22 mmol/L (ref 20–29)
Calcium: 9.1 mg/dL (ref 8.6–10.2)
Chloride: 105 mmol/L (ref 96–106)
Creatinine, Ser: 1.28 mg/dL — ABNORMAL HIGH (ref 0.76–1.27)
GFR calc Af Amer: 59 mL/min/{1.73_m2} — ABNORMAL LOW (ref >59)
GFR calc non Af Amer: 51 mL/min/{1.73_m2} — ABNORMAL LOW (ref >59)
Glucose: 121 mg/dL — ABNORMAL HIGH (ref 65–99)
Potassium: 4.6 mmol/L (ref 3.5–5.2)
Sodium: 141 mmol/L (ref 134–144)

## 2019-06-18 LAB — MAGNESIUM: Magnesium: 2.1 mg/dL (ref 1.6–2.3)

## 2019-06-18 NOTE — Patient Instructions (Addendum)
Medication Instructions:  *If you need a refill on your cardiac medications before your next appointment, please call your pharmacy*   Lab Work: Your physician has recommended that you have lab work today: CBC, BMET, and Mag  If you have labs (blood work) drawn today and your tests are completely normal, you will receive your results only by: [name] MyChart Message (if you have MyChart) OR . A paper copy in the mail If you have any lab test that is abnormal or we need to change your treatment, we will call you to review the results.   Testing/Procedures: Your physician has recommended that you have a Cardioversion (DCCV). Electrical Cardioversion uses a jolt of electricity to your heart either through paddles or wired patches attached to your chest. This is a controlled, usually prescheduled, procedure. Defibrillation is done under light anesthesia in the hospital, and you usually go home the day of the procedure. This is done to get your heart back into a normal rhythm. You are not awake for the procedure. Please see the instruction sheet given to you today.   Follow-Up: At [facility], you and your health needs are our priority.  As part of our continuing mission to provide you with exceptional heart care, we have created designated Provider Care Teams.  These Care Teams include your primary Cardiologist (physician) and Advanced Practice Providers (APPs -  Physician Assistants and Nurse Practitioners) who all work together to provide you with the care you need, when you need it.  We recommend signing up for the patient portal called "MyChart".  Sign up information is provided on this After Visit Summary.  MyChart is used to connect with patients for Virtual Visits (Telemedicine).  Patients are able to view lab/test results, encounter notes, upcoming appointments, etc.  Non-urgent messages can be sent to your provider as well.   To learn more about what you can do with MyChart, go to  [email or web address].    Your next appointment:   Your physician recommends that you schedule a follow-up appointment in 2 weeks (from Cardioversion) with Doristine Devoid, NP in AFIB clinic  The format for your next appointment:   In Person  Provider:   Doristine Devoid, NP AFIB Clinic   Other Instructions -- You will need to have a screening Covid-19 test on 6/22/21at 9:30 am at the [facility]. -- [place]  Your provider has recommended a cardioversion.   You are scheduled for a cardioversion on Friday June 28, 2019 at 11:30 am with Dr. Stanford Breed or associates.  Please go to [facility] at 9:30 am.   Enter through the [place] not have any food or drink after midnight on the night before your procedure .  You may take your medicines with a sip of water on the day of your procedure.  You will need someone to drive you home following your procedure.   Call the [place] office at [phone] if you have any questions, problems or concerns.     Electrical Cardioversion Electrical cardioversion is the delivery of a jolt of electricity to change the rhythm of the heart. Sticky patches or metal paddles are placed on the chest to deliver the electricity from a device. This is done to restore a normal rhythm. A rhythm that is too fast or not regular keeps the heart from pumping well. Electrical cardioversion is done in an emergency if:   There is  low or no blood pressure as a result of the heart rhythm.   Normal rhythm must be restored as fast as possible to protect the brain and heart from further damage.   It may save a life. Cardioversion may be done for heart rhythms that are not immediately life threatening, such as atrial fibrillation or flutter, in which:   The heart is beating too fast or is not regular.   Medicine to change the rhythm has not worked.   It is safe to wait in order  to allow time for preparation.  Symptoms of the abnormal rhythm are bothersome.  The risk of stroke and other serious problems can be reduced.  LET [facility] CARE PROVIDER KNOW ABOUT:   Any allergies you have.  All medicines you are taking, including vitamins, herbs, eye drops, creams, and over-the-counter medicines.  Previous problems you or members of your family have had with the use of anesthetics.   Any blood disorders you have.   Previous surgeries you have had.   Medical conditions you have.   RISKS AND COMPLICATIONS  Generally, this is a safe procedure. However, problems can occur and include:   Breathing problems related to the anesthetic used.  A blood clot that breaks free and travels to other parts of your body. This could cause a stroke or other problems. The risk of this is lowered by use of blood-thinning medicine (anticoagulant) prior to the procedure.  Cardiac arrest (rare).   BEFORE THE PROCEDURE   You may have tests to detect blood clots in your heart and to evaluate heart function.  You may start taking anticoagulants so your blood does not clot as easily.   Medicines may be given to help stabilize your heart rate and rhythm.   PROCEDURE  You will be given medicine through an IV tube to reduce discomfort and make you sleepy (sedative).   An electrical shock will be delivered.   AFTER THE PROCEDURE Your heart rhythm will be watched to make sure it does not change. You will need someone to drive you home

## 2019-06-25 ENCOUNTER — Other Ambulatory Visit (HOSPITAL_COMMUNITY)
Admission: RE | Admit: 2019-06-25 | Discharge: 2019-06-25 | Disposition: A | Discharge status: Home / Self Care | Payer: Medicare Other | Source: Ambulatory Visit | Attending: Cardiology | Admitting: Cardiology

## 2019-06-25 DIAGNOSIS — Z20822 Contact with and (suspected) exposure to covid-19: Secondary | ICD-10-CM | POA: Diagnosis not present

## 2019-06-25 DIAGNOSIS — Z01812 Encounter for preprocedural laboratory examination: Secondary | ICD-10-CM | POA: Diagnosis present

## 2019-06-25 LAB — SARS CORONAVIRUS 2 (TAT 6-24 HRS): SARS Coronavirus 2: NEGATIVE

## 2019-06-27 ENCOUNTER — Other Ambulatory Visit: Payer: Self-pay | Admitting: Internal Medicine

## 2019-06-28 ENCOUNTER — Ambulatory Visit (HOSPITAL_COMMUNITY): Payer: Medicare Other | Admitting: Certified Registered"

## 2019-06-28 ENCOUNTER — Encounter (HOSPITAL_COMMUNITY)
Admission: RE | Disposition: A | Discharge status: Home / Self Care | Payer: Self-pay | Source: Home / Self Care | Attending: Cardiology

## 2019-06-28 ENCOUNTER — Other Ambulatory Visit: Payer: Self-pay | Admitting: Student

## 2019-06-28 ENCOUNTER — Ambulatory Visit (HOSPITAL_COMMUNITY)
Admission: RE | Admit: 2019-06-28 | Discharge: 2019-06-28 | Disposition: A | Discharge status: Home / Self Care | Payer: Medicare Other | Attending: Cardiology | Admitting: Cardiology

## 2019-06-28 ENCOUNTER — Other Ambulatory Visit: Payer: Self-pay

## 2019-06-28 ENCOUNTER — Encounter (HOSPITAL_COMMUNITY): Payer: Self-pay | Admitting: Cardiology

## 2019-06-28 DIAGNOSIS — I130 Hypertensive heart and chronic kidney disease with heart failure and stage 1 through stage 4 chronic kidney disease, or unspecified chronic kidney disease: Secondary | ICD-10-CM | POA: Insufficient documentation

## 2019-06-28 DIAGNOSIS — I4819 Other persistent atrial fibrillation: Secondary | ICD-10-CM | POA: Insufficient documentation

## 2019-06-28 DIAGNOSIS — Z79899 Other long term (current) drug therapy: Secondary | ICD-10-CM | POA: Diagnosis not present

## 2019-06-28 DIAGNOSIS — Z95810 Presence of automatic (implantable) cardiac defibrillator: Secondary | ICD-10-CM | POA: Insufficient documentation

## 2019-06-28 DIAGNOSIS — Z7901 Long term (current) use of anticoagulants: Secondary | ICD-10-CM | POA: Diagnosis not present

## 2019-06-28 DIAGNOSIS — Z87891 Personal history of nicotine dependence: Secondary | ICD-10-CM | POA: Insufficient documentation

## 2019-06-28 DIAGNOSIS — N183 Chronic kidney disease, stage 3 unspecified: Secondary | ICD-10-CM | POA: Insufficient documentation

## 2019-06-28 DIAGNOSIS — I2510 Atherosclerotic heart disease of native coronary artery without angina pectoris: Secondary | ICD-10-CM | POA: Diagnosis not present

## 2019-06-28 DIAGNOSIS — I509 Heart failure, unspecified: Secondary | ICD-10-CM | POA: Diagnosis not present

## 2019-06-28 DIAGNOSIS — E785 Hyperlipidemia, unspecified: Secondary | ICD-10-CM | POA: Insufficient documentation

## 2019-06-28 DIAGNOSIS — Z8249 Family history of ischemic heart disease and other diseases of the circulatory system: Secondary | ICD-10-CM | POA: Insufficient documentation

## 2019-06-28 DIAGNOSIS — Z8674 Personal history of sudden cardiac arrest: Secondary | ICD-10-CM | POA: Insufficient documentation

## 2019-06-28 DIAGNOSIS — I255 Ischemic cardiomyopathy: Secondary | ICD-10-CM | POA: Diagnosis not present

## 2019-06-28 HISTORY — PX: CARDIOVERSION: SHX1299

## 2019-06-28 SURGERY — CARDIOVERSION
Anesthesia: General

## 2019-06-28 MED ORDER — SODIUM CHLORIDE 0.9 % IV SOLN: INTRAVENOUS | Status: DC | PRN

## 2019-06-28 MED ORDER — LIDOCAINE 2% (20 MG/ML) 5 ML SYRINGE
INTRAMUSCULAR | Status: DC | PRN
  Administered 2019-06-28: 60 mg via INTRAVENOUS

## 2019-06-28 MED ORDER — PROPOFOL 10 MG/ML IV BOLUS
INTRAVENOUS | Status: DC | PRN
  Administered 2019-06-28: 75 mg via INTRAVENOUS

## 2019-06-28 MED ORDER — SODIUM CHLORIDE 0.9 % IV SOLN
INTRAVENOUS | Status: AC | PRN
  Administered 2019-06-28: 500 mL via INTRAVENOUS

## 2019-06-28 NOTE — Anesthesia Procedure Notes (Signed)
Procedure Name: General with mask airway Date/Time: 06/28/2019 11:38 AM Performed by: Harden Mo, CRNA Pre-anesthesia Checklist: Patient identified, Emergency Drugs available, Suction available and Patient being monitored Patient Re-evaluated:Patient Re-evaluated prior to induction Oxygen Delivery Method: Ambu bag Preoxygenation: Pre-oxygenation with 100% oxygen Induction Type: IV induction Placement Confirmation: positive ETCO2 and breath sounds checked- equal and bilateral Dental Injury: Teeth and Oropharynx as per pre-operative assessment

## 2019-06-28 NOTE — Anesthesia Preprocedure Evaluation (Addendum)
Anesthesia Evaluation  Patient identified by MRN, date of birth, ID band Patient awake    Reviewed: Allergy & Precautions, NPO status , Patient's Chart, lab work & pertinent test results  Airway Mallampati: II  TM Distance: >3 FB Neck ROM: Full    Dental  (+) Upper Dentures, Lower Dentures   Pulmonary former smoker,    Pulmonary exam normal breath sounds clear to auscultation       Cardiovascular hypertension, Pt. on home beta blockers and Pt. on medications + CAD and +CHF  + dysrhythmias Atrial Fibrillation + Cardiac Defibrillator  Rhythm:Irregular Rate:Normal  ECG: a-fib, rate 71   Neuro/Psych negative neurological ROS     GI/Hepatic negative GI ROS, Neg liver ROS,   Endo/Other  negative endocrine ROS  Renal/GU negative Renal ROS     Musculoskeletal negative musculoskeletal ROS (+)   Abdominal (+) + obese,   Peds  Hematology  (+) anemia , HLD   Anesthesia Other Findings PERSISTENT A-FIB  Reproductive/Obstetrics                            Anesthesia Physical Anesthesia Plan  ASA: III  Anesthesia Plan: General   Post-op Pain Management:    Induction: Intravenous  PONV Risk Score and Plan: 2 and Propofol infusion and Treatment may vary due to age or medical condition  Airway Management Planned: Mask  Additional Equipment:   Intra-op Plan:   Post-operative Plan: Extubation in OR  Informed Consent: I have reviewed the patients History and Physical, chart, labs and discussed the procedure including the risks, benefits and alternatives for the proposed anesthesia with the patient or authorized representative who has indicated his/her understanding and acceptance.       Plan Discussed with: CRNA  Anesthesia Plan Comments:        Anesthesia Quick Evaluation

## 2019-06-28 NOTE — H&P (Signed)
Office Visit 06/18/2019 [facility], Satira Mccallum, [place] Physician Assistant  Pre-procedure lab exam +5 more Dx  Referred by Jeanmarie Hubert, MD Reason for Visit  Additional Documentation  Vitals:  BP 118/68  Pulse 72  Ht 5\' 7"  (1.702 m)  Wt 88.5 kg  SpO2 96%  BMI 30.54 kg/m  BSA 2.05 m  Flowsheets:  NEWS,  MEWS Score,  Anthropometrics    Encounter Info:  Billing Info,  History,  Allergies,  Detailed Report    All Notes  Progress Notes by Shirley Friar, PA-C at 06/18/2019 10:05 AM Author: Shirley Friar, PA-C Author Type: Physician Assistant Certified Filed: 0/97/3532 11:34 AM  Note Status: Signed Cosign: Cosign Not Required Encounter Date: 06/18/2019  Editor: Annamaria Helling (Physician Assistant Certified)               [phone]!   Electrophysiology Office Note Date: 06/18/2019  ID:  BOSCO PAPARELLA, DOB 27-Sep-1931, MRN [id number]  PCP: Jeanmarie Hubert, MD Primary Cardiologist: No primary care provider on file. Electrophysiologist: Cristopher Peru, MD   CC: Routine ICD follow-up  DONIELLE RADZIEWICZ is a 86 y.o. male seen today for Cristopher Peru, MD for routine electrophysiology followup.  Since last being seen in our clinic the patient reports doing OK overall. He has noticed less energy, so was not surprised to find out he has been in AF). He has mild dyspnea with moderate exertion. He denies chest pain, palpitations, PND, orthopnea, nausea, vomiting, dizziness, syncope, edema, weight gain, or early satiety. He has not had ICD shocks.   Device History: StMudlogger ICD implanted 2003, BiV upgrade 2011, gen change 05/2014 for CHF History of appropriate therapy: Yes History of AAD therapy: Yes; currently on amiodarone       Past Medical History:  Diagnosis Date  . CAD (coronary artery disease)   . CHF (congestive heart failure) ([place])   . Dyslipidemia   . History of ventricular  fibrillation   . HTN (hypertension)   . Hx-sudden cardiac arrest   . Ischemic cardiomyopathy   . LBBB (left bundle branch block)   . Ventricular tachycardia [facility] [place])         Past Surgical History:  Procedure Laterality Date  . CARDIAC CATHETERIZATION  10/12/2008  . CARDIAC DEFIBRILLATOR PLACEMENT     St Jude  . DOPPLER ECHOCARDIOGRAPHY  2008, 2011  . EP IMPLANTABLE DEVICE N/A 05/19/2014   Procedure: ICD/BIV ICD Generator Changeout;  Surgeon: Evans Lance, MD;  Location: [place] [facility];  Service: Cardiovascular;  Laterality: N/A;  . TONSILECTOMY, ADENOIDECTOMY, BILATERAL MYRINGOTOMY AND TUBES            Current Outpatient Medications  Medication Sig Dispense Refill  . amiodarone (PACERONE) 200 MG tablet Take 1 tablet (200 mg total) by mouth daily. 90 tablet 3  . apixaban (ELIQUIS) 5 MG TABS tablet Take 1 tablet (5 mg total) by mouth 2 (two) times daily. 60 tablet 11  . atorvastatin (LIPITOR) 20 MG tablet TAKE 1 TABLET BY MOUTH DAILY AT BEDTIME 90 tablet 3  . carvedilol (COREG) 25 MG tablet Take 25 mg by mouth 2 (two) times daily with a meal.    . Cholecalciferol (VITAMIN D-3) 1000 UNITS CAPS Take 1,000 Units by mouth daily.     . furosemide (LASIX) 40 MG tablet Take 1 tablet (40 mg total) by mouth daily. Hold dosage if weight < 183 pounds 30 tablet 2  . indomethacin (INDOCIN  SR) 75 MG CR capsule Take 75 mg by mouth daily as needed (gout attacks).    . isosorbide mononitrate (IMDUR) 30 MG 24 hr tablet Take 0.5 tablets (15 mg total) by mouth daily. 45 tablet 3  . losartan (COZAAR) 25 MG tablet Take 0.5 tablets 12.5 mg at Bedtime 90 tablet 3  . omeprazole (PRILOSEC) 20 MG capsule Take 20 mg by mouth daily.    . phenylephrine (SUDAFED PE) 10 MG TABS tablet Take 10 mg by mouth as needed (congestion).    [name] spironolactone (ALDACTONE) 25 MG tablet TAKE 1/2 TABLET BY MOUTH ONCE A DAY. 45 tablet 2  . Tamsulosin HCl (FLOMAX) 0.4 MG CAPS Take 0.4 mg by mouth daily  after breakfast.      No current facility-administered medications for this visit.    Allergies:   Patient has no known allergies.   Social History: Social History        Socioeconomic History  . Marital status: Divorced    Spouse name: Not on file  . Number of children: Not on file  . Years of education: Not on file  . Highest education level: Not on file  Occupational History  . Occupation: Web designer  Tobacco Use  . Smoking status: Former Smoker    Quit date: 01/03/1985    Years since quitting: 34.4  . Smokeless tobacco: Never Used  Vaping Use  . Vaping Use: Never used  Substance and Sexual Activity  . Alcohol use: Never  . Drug use: Never  . Sexual activity: Not on file  Other Topics Concern  . Not on file  Social History Narrative  . Not on file   Social Determinants of Health      Financial Resource Strain:   . Difficulty of Paying Living Expenses:   Food Insecurity: No Food Insecurity  . Worried About Charity fundraiser in the Last Year: Never true  . Ran Out of Food in the Last Year: Never true  Transportation Needs: No Transportation Needs  . Lack of Transportation (Medical): No  . Lack of Transportation (Non-Medical): No  Physical Activity:   . Days of Exercise per Week:   . Minutes of Exercise per Session:   Stress:   . Feeling of Stress :   Social Connections:   . Frequency of Communication with Friends and Family:   . Frequency of Social Gatherings with Friends and Family:   . Attends Religious Services:   . Active Member of Clubs or Organizations:   . Attends Archivist Meetings:   [name] Marital Status:   Intimate Partner Violence:   . Fear of Current or Ex-Partner:   . Emotionally Abused:   [name] Physically Abused:   . Sexually Abused:    Family History:      Family History  Problem Relation Age of Onset  . Coronary artery disease Other    Review of Systems: All other systems reviewed and are  otherwise negative except as noted above.  Physical Exam:    Vitals:   06/18/19 1018  BP: 118/68  Pulse: 72  SpO2: 96%  Weight: 195 lb (88.5 kg)  Height: 5\' 7"  (1.702 m)    GEN- The patient is well appearing, alert and oriented x 3 today.   HEENT: normocephalic, atraumatic; sclera clear, conjunctiva pink; hearing intact; oropharynx clear; neck supple, no JVP Lymph- no cervical lymphadenopathy Lungs- Clear to ausculation bilaterally, normal work of breathing.  No wheezes, rales, rhonchi Heart- Regular rate and  rhythm, no murmurs, rubs or gallops, PMI not laterally displaced GI- soft, non-tender, non-distended, bowel sounds present, no hepatosplenomegaly Extremities- no clubbing, cyanosis, or edema; DP/PT/radial pulses 2+ bilaterally MS- no significant deformity or atrophy Skin- warm and dry, no rash or lesion; ICD pocket well healed Psych- euthymic mood, full affect Neuro- strength and sensation are intact  ICD interrogation- reviewed in detail today,  See PACEART report  EKG:  EKG is ordered today. The ekg ordered today shows V pacing with atrial fibrillation underlying, controlled rate at 71 bpm  Recent Labs: 11/15/2018: B Natriuretic Peptide 725.7 03/18/2019: ALT 25; Hemoglobin 13.0; Magnesium 1.9; Platelets 207; TSH 2.960 04/01/2019: BUN 22; Creatinine, Ser 1.47; Potassium 4.6; Sodium 143      Wt Readings from Last 3 Encounters:  06/18/19 195 lb (88.5 kg)  06/10/19 190 lb (86.2 kg)  03/18/19 193 lb 12.8 oz (87.9 kg)     Other studies Reviewed: Additional studies/ records that were reviewed today include: Previous EP office notes, recent remotes.   Assessment and Plan:  1.  Chronic systolic dysfunction s/p St. Jude CRT-D  euvolemic today Stable on an appropriate medical regimen Normal ICD function See Pace Art report No changes today  2. VT No interim VT  3. CAD Denies ischemic symptoms  4. Persistent AF Continue eliquis for CHA2DS2VASC of 5     Recently noted to have undersensing of his AF by remote 06/11/2019. Sensitivity fixed to 0.3 mV today.    Continue amiodarone  Recent surveillance labs stable He has had increased fatigue, and unclear true AF burden in the setting of undersensing. Will plan DCCV, and then should be able to follow his burden better with sensitivity adjustments.   5. CKD III Stable with addition of losartan  Current medicines are reviewed at length with the patient today.   The patient does not have concerns regarding his medicines.  The following changes were made today:  none  Labs/ tests ordered today include:     Orders Placed This Encounter  Procedures  . Basic Metabolic Panel (BMET)  . CBC w/Diff  . Magnesium  . CUP PACEART [place]  . EKG 12-Lead   Disposition:   Follow up with AF clinic 2 weeks post DCCV. Continue remotes.   Jacalyn Lefevre, PA-C  06/18/2019 11:29 AM  [facility] [address] [place] [address] [phone] (office) [phone] (fax)      For DCCV; compliant with apixaban; no changes. [name]

## 2019-06-28 NOTE — Discharge Instructions (Signed)
Electrical Cardioversion  Follow these instructions at home:  Do not drive for 24 hours if you were given a sedative during your procedure.  Take over-the-counter and prescription medicines only as told by your health care provider.  Ask your health care provider how to check your pulse. Check it often.  Rest for 48 hours after the procedure or as told by your health care provider.  Avoid or limit your caffeine use as told by your health care provider.  Keep all follow-up visits as told by your health care provider. This is important. Contact a health care provider if:  You feel like your heart is beating too quickly or your pulse is not regular.  You have a serious muscle cramp that does not go away. Get help right away if:  You have discomfort in your chest.  You are dizzy or you feel faint.  You have trouble breathing or you are short of breath.  Your speech is slurred.  You have trouble moving an arm or leg on one side of your body.  Your fingers or toes turn cold or blue. Summary  Electrical cardioversion is the delivery of a jolt of electricity to restore a normal rhythm to the heart.  This procedure may be done right away in an emergency or may be a scheduled procedure if the condition is not an emergency.  Generally, this is a safe procedure.  After the procedure, check your pulse often as told by your health care provider. This information is not intended to replace advice given to you by your health care provider. Make sure you discuss any questions you have with your health care provider. Document Revised: 07/23/2018 Document Reviewed: 07/23/2018 Elsevier Patient Education  2020 Elsevier Inc.  

## 2019-06-28 NOTE — Anesthesia Postprocedure Evaluation (Signed)
Anesthesia Post Note  Patient: Mark Nguyen  Procedure(s) Performed: CARDIOVERSION (N/A )     Patient location during evaluation: Endoscopy Anesthesia Type: General Level of consciousness: awake Pain management: pain level controlled Vital Signs Assessment: post-procedure vital signs reviewed and stable Respiratory status: spontaneous breathing, nonlabored ventilation, respiratory function stable and patient connected to nasal cannula oxygen Cardiovascular status: blood pressure returned to baseline and stable Postop Assessment: no apparent nausea or vomiting Anesthetic complications: no   No complications documented.  Last Vitals:  Vitals:   06/28/19 1213 06/28/19 1218  BP:  123/62  Pulse: (!) 59 (!) 59  Resp: 12 16  Temp:    SpO2: 99% 100%    Last Pain:  Vitals:   06/28/19 1218  TempSrc:   PainSc: 0-No pain                 Jamisyn Langer P Ynez Eugenio

## 2019-06-28 NOTE — Procedures (Signed)
Electrical Cardioversion Procedure Note Mark Nguyen [id number] 11-03-1931  Procedure: Electrical Cardioversion Indications:  Atrial Fibrillation  Procedure Details Consent: Risks of procedure as well as the alternatives and risks of each were explained to the (patient/caregiver).  Consent for procedure obtained. Time Out: Verified patient identification, verified procedure, site/side was marked, verified correct patient position, special equipment/implants available, medications/allergies/relevent history reviewed, required imaging and test results available.  Performed  Patient placed on cardiac monitor, pulse oximetry, supplemental oxygen as necessary.  Sedation given: Pt sedated by anesthesia with lidocaine 60 mg and diprovan 75 mg IV Pacer pads placed anterior and posterior chest.  Cardioverted 2 time(s).  Cardioverted at 120J unsuccessful; second attempt with 200J resulted in sinus rhythm.  Evaluation Findings: Post procedure EKG shows: NSR Complications: None Patient did tolerate procedure well.   Mark Nguyen 06/28/2019, 10:07 AM

## 2019-06-28 NOTE — Transfer of Care (Signed)
Immediate Anesthesia Transfer of Care Note  Patient: Mark Nguyen  Procedure(s) Performed: CARDIOVERSION (N/A )  Patient Location: Endoscopy Unit  Anesthesia Type:General  Level of Consciousness: awake, alert  and oriented  Airway & Oxygen Therapy: Patient Spontanous Breathing  Post-op Assessment: Report given to RN, Post -op Vital signs reviewed and stable and Patient moving all extremities  Post vital signs: Reviewed and stable  Last Vitals:  Vitals Value Taken Time  BP    Temp    Pulse    Resp    SpO2      Last Pain:  Vitals:   06/28/19 0935  TempSrc: Temporal  PainSc: 0-No pain         Complications: No complications documented.

## 2019-06-30 ENCOUNTER — Encounter (HOSPITAL_COMMUNITY): Payer: Self-pay | Admitting: Cardiology

## 2019-07-02 ENCOUNTER — Other Ambulatory Visit: Payer: Self-pay | Admitting: Internal Medicine

## 2019-07-03 ENCOUNTER — Other Ambulatory Visit: Payer: Self-pay

## 2019-07-03 MED ORDER — FUROSEMIDE 40 MG PO TABS: 40.0000 mg | ORAL_TABLET | Freq: Every day | ORAL | 3 refills | Status: DC

## 2019-07-03 MED ORDER — FUROSEMIDE 40 MG PO TABS: 40.0000 mg | ORAL_TABLET | Freq: Every day | ORAL | 2 refills | Status: DC

## 2019-07-03 NOTE — Telephone Encounter (Signed)
Pt's pharmacy is requesting a refill on furosemide 40 mg tablet. Would Dr. Lovena Le like to refill this medication? Please address

## 2019-07-03 NOTE — Addendum Note (Signed)
Addended by: Willeen Cass A on: 07/03/2019 04:50 PM   Modules accepted: Orders

## 2019-07-12 ENCOUNTER — Other Ambulatory Visit: Payer: Self-pay

## 2019-07-12 ENCOUNTER — Ambulatory Visit (HOSPITAL_COMMUNITY)
Admission: RE | Admit: 2019-07-12 | Discharge: 2019-07-12 | Disposition: A | Discharge status: Home / Self Care | Payer: Medicare Other | Source: Ambulatory Visit | Attending: Nurse Practitioner | Admitting: Nurse Practitioner

## 2019-07-12 VITALS — BP 126/66 | HR 60 | Ht 67.0 in | Wt 191.0 lb

## 2019-07-12 DIAGNOSIS — I4891 Unspecified atrial fibrillation: Secondary | ICD-10-CM | POA: Diagnosis not present

## 2019-07-12 DIAGNOSIS — I255 Ischemic cardiomyopathy: Secondary | ICD-10-CM | POA: Insufficient documentation

## 2019-07-12 DIAGNOSIS — Z87891 Personal history of nicotine dependence: Secondary | ICD-10-CM | POA: Diagnosis not present

## 2019-07-12 DIAGNOSIS — I472 Ventricular tachycardia: Secondary | ICD-10-CM | POA: Insufficient documentation

## 2019-07-12 DIAGNOSIS — I2510 Atherosclerotic heart disease of native coronary artery without angina pectoris: Secondary | ICD-10-CM | POA: Insufficient documentation

## 2019-07-12 DIAGNOSIS — Z8249 Family history of ischemic heart disease and other diseases of the circulatory system: Secondary | ICD-10-CM | POA: Diagnosis not present

## 2019-07-12 DIAGNOSIS — I480 Paroxysmal atrial fibrillation: Secondary | ICD-10-CM

## 2019-07-12 DIAGNOSIS — Z7901 Long term (current) use of anticoagulants: Secondary | ICD-10-CM | POA: Diagnosis not present

## 2019-07-12 DIAGNOSIS — Z95810 Presence of automatic (implantable) cardiac defibrillator: Secondary | ICD-10-CM | POA: Diagnosis not present

## 2019-07-12 DIAGNOSIS — E785 Hyperlipidemia, unspecified: Secondary | ICD-10-CM | POA: Diagnosis not present

## 2019-07-12 DIAGNOSIS — I110 Hypertensive heart disease with heart failure: Secondary | ICD-10-CM | POA: Insufficient documentation

## 2019-07-12 DIAGNOSIS — I447 Left bundle-branch block, unspecified: Secondary | ICD-10-CM | POA: Diagnosis not present

## 2019-07-12 DIAGNOSIS — D6869 Other thrombophilia: Secondary | ICD-10-CM

## 2019-07-12 DIAGNOSIS — Z79899 Other long term (current) drug therapy: Secondary | ICD-10-CM | POA: Insufficient documentation

## 2019-07-12 DIAGNOSIS — I509 Heart failure, unspecified: Secondary | ICD-10-CM | POA: Insufficient documentation

## 2019-07-12 NOTE — Progress Notes (Signed)
Primary Care Physician: Lacinda Axon, MD Referring Physician: Deirdre Pippins, PA    Mark Nguyen is a 86 y.o. male with a h/o afib on amiodarone that was recently seen by Jonni Sanger and found to be out of rhythm. He was sent for cardioversion and is now in the afib clinic for f/u. The cardioversion was successful and he continues to be in rhythm as EKG shows AV dual paced rhythm. He feels improved.   Today, he denies symptoms of palpitations, chest pain, shortness of breath, orthopnea, PND, lower extremity edema, dizziness, presyncope, syncope, or neurologic sequela. The patient is tolerating medications without difficulties and is otherwise without complaint today.   Past Medical History:  Diagnosis Date  . CAD (coronary artery disease)   . CHF (congestive heart failure) ([place])   . Dyslipidemia   . History of ventricular fibrillation   . HTN (hypertension)   . Hx-sudden cardiac arrest   . Ischemic cardiomyopathy   . LBBB (left bundle branch block)   . Ventricular tachycardia [facility])    Past Surgical History:  Procedure Laterality Date  . CARDIAC CATHETERIZATION  10/12/2008  . CARDIAC DEFIBRILLATOR PLACEMENT     St Jude  . CARDIOVERSION N/A 06/28/2019   Procedure: CARDIOVERSION;  Surgeon: Lelon Perla, MD;  Location: [facility] ENDOSCOPY;  Service: Cardiovascular;  Laterality: N/A;  . DOPPLER ECHOCARDIOGRAPHY  2008, 2011  . EP IMPLANTABLE DEVICE N/A 05/19/2014   Procedure: ICD/BIV ICD Generator Changeout;  Surgeon: Evans Lance, MD;  Location: [place] [facility];  Service: Cardiovascular;  Laterality: N/A;  . TONSILECTOMY, ADENOIDECTOMY, BILATERAL MYRINGOTOMY AND TUBES      Current Outpatient Medications  Medication Sig Dispense Refill  . amiodarone (PACERONE) 200 MG tablet Take 1 tablet (200 mg total) by mouth daily. 90 tablet 3  . apixaban (ELIQUIS) 5 MG TABS tablet Take 1 tablet (5 mg total) by mouth 2 (two) times daily. 60 tablet 11  . atorvastatin (LIPITOR) 20 MG  tablet TAKE 1 TABLET BY MOUTH DAILY AT BEDTIME 90 tablet 3  . carvedilol (COREG) 25 MG tablet Take 25 mg by mouth 2 (two) times daily with a meal.    . Cholecalciferol (VITAMIN D-3) 1000 UNITS CAPS Take 1,000 Units by mouth daily.     . furosemide (LASIX) 40 MG tablet Take 1 tablet (40 mg total) by mouth daily. Hold dosage if weight < 183 pounds 90 tablet 3  . indomethacin (INDOCIN SR) 75 MG CR capsule Take 75 mg by mouth daily as needed (gout attacks).    . isosorbide mononitrate (IMDUR) 30 MG 24 hr tablet Take 0.5 tablets (15 mg total) by mouth daily. 45 tablet 3  . losartan (COZAAR) 25 MG tablet Take 0.5 tablets 12.5 mg at Bedtime 90 tablet 3  . omeprazole (PRILOSEC) 20 MG capsule Take 20 mg by mouth daily.    . phenylephrine (SUDAFED PE) 10 MG TABS tablet Take 10 mg by mouth as needed (congestion).    [name] spironolactone (ALDACTONE) 25 MG tablet TAKE 1/2 TABLET BY MOUTH ONCE A DAY. 45 tablet 2  . Tamsulosin HCl (FLOMAX) 0.4 MG CAPS Take 0.4 mg by mouth daily after breakfast.      No current facility-administered medications for this encounter.    No Known Allergies  Social History   Socioeconomic History  . Marital status: Divorced    Spouse name: Not on file  . Number of children: Not on file  . Years of education: Not on file  . Highest  education level: Not on file  Occupational History  . Occupation: Web designer  Tobacco Use  . Smoking status: Former Smoker    Quit date: 01/03/1985    Years since quitting: 34.5  . Smokeless tobacco: Never Used  Vaping Use  . Vaping Use: Never used  Substance and Sexual Activity  . Alcohol use: Never  . Drug use: Never  . Sexual activity: Not on file  Other Topics Concern  . Not on file  Social History Narrative  . Not on file   Social Determinants of Health   Financial Resource Strain:   . Difficulty of Paying Living Expenses:   Food Insecurity: No Food Insecurity  . Worried About Charity fundraiser in the Last Year:  Never true  . Ran Out of Food in the Last Year: Never true  Transportation Needs: No Transportation Needs  . Lack of Transportation (Medical): No  . Lack of Transportation (Non-Medical): No  Physical Activity:   . Days of Exercise per Week:   . Minutes of Exercise per Session:   Stress:   . Feeling of Stress :   Social Connections:   . Frequency of Communication with Friends and Family:   . Frequency of Social Gatherings with Friends and Family:   . Attends Religious Services:   . Active Member of Clubs or Organizations:   . Attends Archivist Meetings:   [name] Marital Status:   Intimate Partner Violence:   . Fear of Current or Ex-Partner:   . Emotionally Abused:   [name] Physically Abused:   . Sexually Abused:     Family History  Problem Relation Age of Onset  . Coronary artery disease Other     ROS- All systems are reviewed and negative except as per the HPI above  Physical Exam: Vitals:   07/12/19 0955  BP: 126/66  Pulse: 60  Weight: 86.6 kg  Height: 5\' 7"  (1.702 m)   Wt Readings from Last 3 Encounters:  07/12/19 86.6 kg  06/28/19 88.5 kg  06/18/19 88.5 kg    Labs: Lab Results  Component Value Date   NA 141 06/18/2019   K 4.6 06/18/2019   CL 105 06/18/2019   CO2 22 06/18/2019   GLUCOSE 121 (H) 06/18/2019   BUN 16 06/18/2019   CREATININE 1.28 (H) 06/18/2019   CALCIUM 9.1 06/18/2019   PHOS 2.8 11/15/2018   MG 2.1 06/18/2019   Lab Results  Component Value Date   INR 1.0 05/15/2014   Lab Results  Component Value Date   CHOL 149 11/16/2018   HDL 41 11/16/2018   LDLCALC 88 11/16/2018   TRIG 101 11/16/2018     GEN- The patient is well appearing, alert and oriented x 3 today.   Head- normocephalic, atraumatic Eyes-  Sclera clear, conjunctiva pink Ears- hearing intact Oropharynx- clear Neck- supple, no JVP Lymph- no cervical lymphadenopathy Lungs- Clear to ausculation bilaterally, normal work of breathing Heart- Regular rate and rhythm, no  murmurs, rubs or gallops, PMI not laterally displaced GI- soft, NT, ND, + BS Extremities- no clubbing, cyanosis, or edema MS- no significant deformity or atrophy Skin- no rash or lesion Psych- euthymic mood, full affect Neuro- strength and sensation are intact  EKG-AV dual paced rhythm, pr int 202 ms, qrs int 156 ms, qtc 524 ms    Assessment and Plan: 1. afib  Recent successful cardioversion  No complaints today Continue  amiodarone 200 mg daily  Continue carvedilol 25 mg bid  2. CHA2DS2VASc score of 4 Continue eliquis 5 mg bid   Reminded not to interrupt anticoagulation for 4 weeks after cardioversion   F/u with remote checks and Dr. Lovena Le per recall   Geroge Baseman. Cordie Beazley, [facility] [address] [place], [place] [address] [phone]

## 2019-07-17 ENCOUNTER — Other Ambulatory Visit: Payer: Self-pay | Admitting: Internal Medicine

## 2019-07-18 ENCOUNTER — Other Ambulatory Visit: Payer: Self-pay | Admitting: Internal Medicine

## 2019-08-30 ENCOUNTER — Other Ambulatory Visit: Payer: Self-pay | Admitting: Internal Medicine

## 2019-09-10 ENCOUNTER — Encounter: Payer: Self-pay | Admitting: *Deleted

## 2019-09-10 ENCOUNTER — Other Ambulatory Visit: Payer: Self-pay | Admitting: *Deleted

## 2019-09-10 ENCOUNTER — Ambulatory Visit (INDEPENDENT_AMBULATORY_CARE_PROVIDER_SITE_OTHER): Payer: Medicare Other | Admitting: *Deleted

## 2019-09-10 ENCOUNTER — Other Ambulatory Visit: Payer: Self-pay

## 2019-09-10 ENCOUNTER — Telehealth: Payer: Self-pay

## 2019-09-10 DIAGNOSIS — I469 Cardiac arrest, cause unspecified: Secondary | ICD-10-CM | POA: Diagnosis not present

## 2019-09-10 LAB — CUP PACEART REMOTE DEVICE CHECK
Battery Remaining Longevity: 14 mo
Battery Remaining Percentage: 28 %
Battery Voltage: 2.84 V
Brady Statistic AP VP Percent: 99 %
Brady Statistic AP VS Percent: 1 %
Brady Statistic AS VP Percent: 1 %
Brady Statistic AS VS Percent: 1 %
Brady Statistic RA Percent Paced: 87 %
Date Time Interrogation Session: 20210907020018
HighPow Impedance: 47 Ohm
HighPow Impedance: 47 Ohm
Implantable Lead Implant Date: 20030319
Implantable Lead Implant Date: 20030319
Implantable Lead Implant Date: 20110211
Implantable Lead Location: 753858
Implantable Lead Location: 753859
Implantable Lead Location: 753860
Implantable Lead Model: 158
Implantable Lead Model: 4087
Implantable Lead Serial Number: 113412
Implantable Lead Serial Number: 159867
Implantable Pulse Generator Implant Date: 20160516
Lead Channel Impedance Value: 390 Ohm
Lead Channel Impedance Value: 450 Ohm
Lead Channel Impedance Value: 760 Ohm
Lead Channel Pacing Threshold Amplitude: 0.75 V
Lead Channel Pacing Threshold Amplitude: 1.25 V
Lead Channel Pacing Threshold Amplitude: 2.25 V
Lead Channel Pacing Threshold Pulse Width: 0.5 ms
Lead Channel Pacing Threshold Pulse Width: 1 ms
Lead Channel Pacing Threshold Pulse Width: 1 ms
Lead Channel Sensing Intrinsic Amplitude: 0.8 mV
Lead Channel Sensing Intrinsic Amplitude: 12 mV
Lead Channel Setting Pacing Amplitude: 2.5 V
Lead Channel Setting Pacing Amplitude: 2.5 V
Lead Channel Setting Pacing Amplitude: 2.75 V
Lead Channel Setting Pacing Pulse Width: 0.5 ms
Lead Channel Setting Pacing Pulse Width: 1 ms
Lead Channel Setting Sensing Sensitivity: 0.5 mV
Pulse Gen Serial Number: 7247398

## 2019-09-10 NOTE — Telephone Encounter (Signed)
Mark Nguyen scheduled remote 09/10/19. Ongoing AF/Bi-VP. Recent cardioversion on 07/12/19. Patient reports of medication compliance including Amiodarone 200 mg daily, Eliquis 5 mg BID, Coreg 25 mg BID.  Denies any complaints.  Advised I will forward for review and recommendations to Dr. Lovena Le and D. Kayleen Memos, [place].

## 2019-09-10 NOTE — Patient Outreach (Signed)
[facility]) Care Management  09/10/2019  Mark Nguyen [birth year] [id number]   [facility] outreach to MD referred patient for complex care services  Mark Nguyen was referred to [facility] by Damian Leavell, RN, [name], RN from MD office on 01/23/19 for [place] assistance with [facility] pharmacy assistance was provided and issues resolved  Mark Nguyen has been followed for [facility] complex care (coordination and disease management)  Patient called [facility] main office [facility] RN CM returned a call to him at his listed home number  Patient is able to verify HIPAA, DOB and Address   Reviewed reason for follow up/care coordination call to [facility] patient - quarterly follow up   He reports he is doing good  He denies any medical complaints or issues with his obtaining his medication He and [facility] RN CM reviewed his health care since the last [facility] outreach in June 2021  He went to Atrial fibrillation clinic  Reviewed his letter  He continues with home device transmission checks without reported issues He reports completing a home device check this morning around 0750 and confirms he is aware that the next one is on 12/10/19  He keeps up with his After summary sheet/hospital discharge sheets    Updates [facility] RN CM that he had a cardioversion on June 28 2019 1130 with Dr Crenshaw/associates ar [facility] hospital for persistent Atrial fibrillation.  He reports "I did not know the difference before or after but I been a still going like it should" He reports not being able to differentiate the change in his heart rate or rhythms He reports no cardiac issues only sinus concerns  He denies chest pain, palpitations, PND, orthopnea, nausea, vomiting, dizziness, syncope, edema, weight gain, or early satiety He reports his nasal passages get "stuffy" and he takes medicine to open up his sinus "an I'm okay"  He did go to the Atrial fibrillation clinic on 07/12/19 for follow up His EKG showed AV dual paced  rhythm. He confirms he does not go to the Afib clinic on a routine basis.   Device History: [employer] ICD implanted 2003, BiV upgrade 2011, gen change 05/2014 for CHF History of appropriate therapy: Yes History of AAD therapy: Yes; currently on amiodarone   CHF/atrial fibrillation  denies chest pain  He reports doing well with use of his diuretic He now takes his diuretic "every other day" He reports a weight of 188-190 lb as he continues to weigh daily He reports his average "normal" weight is 190 Lbs   Change in cardiac provider He reports receiving a letter on 04/23/19 from Dr Jeanmarie Hubert stating he was going to [facility] and his new provider Mark Nguyen reports being able to pronounce the providers last name [facility] RN CM notes a letter in Epic indicating Dr Linwood Dibbles [facility] RN CM discuss   Discussed new urologist (medicare) He never got a new urologist denies need "Still doing fine"  I still go pretty often"as he is on a diuretic which he now is taking 1 qod wt  188-190 lbs with weight checks He has chosen not to go see one of the MDs on the list of urologists provided in June 2021 at [place] Drs Marguarite Arbour, Dr Phebe Colla and Dr Tawny Asal He denies worsening symptoms and states he will try the office prn    [place] RN CM discussed  [facility] health coach services  Mark Nguyen agrees to transfer to [facility] health coach as he is doing very well and managing at home well He only voices concerns that he will continue Eliquis with the MD assistance to renew the application without issues He also reports concerns with understanding any other medical provider related to dialect   Medications: Eliquis He states he did call to [place] staff  in May 2021 and received assistance with the Eliquis application He sent in his application He now gets a 3 month supply He has enough to last until December 2021   Social: Mark Nguyen  is a 86 year old divorced patient who lives alone. He reports having assistance for his siblings prn. He has is HARD OF HEARING as confirmed by him "especially in the right ear" He continues to be independent in his care needs and continues to drive himself to his own medical appointments. He reports going out of the home to get breakfast most mornings and staying active most afternoons  Conditions:Atrial fibrillation, HTN, chronic combined diastolic and systolic heart failure, automatic implantable cardioverter defibrillator, iron deficiency anemia, hyperglycemia, hx of sudden cardiac arrest, Hard of hearing (HOH) especially in the right ear per patient, acute, respiratory failure with hypoxia, dyslipidemia, Coronary atherosclerosis, ischemic cardiomyopathy, LBBB, ventricular Tachycardia, ventricular fibrillation, at risk for obstructive sleep apnea   NWG:NFAO prn, scales  Appointments:  09/10/19 0750 cardiology 12/10/19 0750 cardiology  [facility] RN CM will transfer  Mark Nguyen as he agreed during this outreach to  [facility] disease management/health coach- Referred to [facility] health coach Pt encouraged to return a call to [facility] RN CM prn Complex case closure letter to patient Routed note/Complex care letter closure to MD  Goals Addressed              This Visit's Progress     Patient Stated   .  [facility]) Patient will be able to verbalize with outreach interventions to manage his Afib, CHF & HTn at home (pt-stated)        [place] (see longitudinal plan of care for additional care plan information)   Current Barriers:  . Cognitive deficits . Knowledge deficit related to basic atrial fibrillation, congestive Heart Failure (CHF) and hypertension pathophysiology and self care management     Case Manager Clinical Goal(s):  [name] Over the next  30 days patient will interact with [facility] disease manager/health coach . Over the next 90 days, patient will verbalize understanding of atrial  fibrillation Action Plan and when to call doctor -09/10/19 continues to progress and follow his Afib & CHF action plan but continues to need more knowledge of continued home care    Interventions:  . Checked on the status of his Eliquis medication assistance needs  . Basic overview and discussion of pathophysiology of atrial fibrillation and CHF reviewed  . Provided written education on heart healthy diet . Reviewed atrial fibrillation and CHF Action Plans in depth and provided written copy . Review use of anticoagulant- Eliquis . Reviewed June 2021 cardioversion for further rhythm issues . Reviewed April 2021 letter with change of cardiology staff from  Jarome Matin to Dr Jodell Cipro . Reviewed  [facility] disease management/health coach services and transferred   Patient Self Care Activities:  . Continue to check weight daily for CHF and report weight gains of greater than 3-5 lbs to cardiologist . Takes atrial fibrillation  Medications as prescribed . Verbalizes understanding of and follows atrial fibrillation  Action Plan . Adheres to low sodium diet . Will contact local urologists prn (list of urologists provided in June 2021-see [facility] RN CM note)   Initial goal documentation Please see other previous [facility] care plan information listed in Epic under the flow sheet section         Kennette Cuthrell [name], RN, BSN, [place] Coordinator Office number [phone] [facility] number [phone] Fax number [phone]

## 2019-09-11 NOTE — Progress Notes (Signed)
Remote ICD transmission.   

## 2019-09-12 NOTE — Telephone Encounter (Signed)
Nothing else to do. Continue current meds.

## 2019-09-20 NOTE — Telephone Encounter (Signed)
Called pt back to provide update.  Pt verbalizes understanding of Dr. Tanna Furry recommendation regarding no changes at this time.  Pt reports he has some SOB with exertion, but feels it has been stable over the past few months.  Denies any other recent cardiac symptoms.  Pt aware to call back if he begins experiencing palpitations, increased SOB, chest discomfort, or any other symptoms.  Pt verbalizes appreciation of call and denies additional questions or concerns at this time.

## 2019-09-27 NOTE — Telephone Encounter (Signed)
Fu Note    Pt called in and needed sch his fu appt in Dec but reported he is having a slight increase of SOB when he walks.  He stated it some be getting a little worse.  No Palpitation and no Chest Discomfort.     Best number [phone]

## 2019-09-27 NOTE — Telephone Encounter (Addendum)
I spoke with patient who reports he was calling to check on follow up appointments.  He has remote check scheduled for December but not office visit. Patient reports since being back in afib he has noticed increased shortness of breath when walking.  Would like sooner appointment than planned follow up in December.  I scheduled patient to see Mark Kilts, PA on October 1,2021 at 8:25

## 2019-10-03 NOTE — Progress Notes (Signed)
Electrophysiology Office Note Date: 10/04/2019  ID:  Mark Nguyen, DOB 11-28-1931, MRN [id number]  PCP: Lacinda Axon, MD Primary Cardiologist: No primary care provider on file. Electrophysiologist: Cristopher Peru, MD   CC: Routine ICD follow-up  Mark Nguyen is a 86 y.o. male seen today for Cristopher Peru, MD for acute visit due to SOB.  Since last being seen in our clinic the patient reports doing well overall. He has occasional SOB, but more so attributes this to his sinuses. "once my sinuses are opened up, I can breathe fine". He has chronic DOE on inclines. he denies chest pain, palpitations, PND, orthopnea, nausea, vomiting, dizziness, syncope, edema, weight gain, or early satiety. He has not had ICD shocks.  Pt has no specific concerns today. He states he came because of the call telling him he was having more AF, and his family encouraged him to follow up sooner.   Device History: St. Jude BiV ICD implanted 2003 -> Biv upgrade 2011, gen change 05/2014 for CHF History of appropriate therapy: Yes History of AAD therapy: Yes; currently on amiodarone   Past Medical History:  Diagnosis Date  . CAD (coronary artery disease)   . CHF (congestive heart failure) ([place])   . Dyslipidemia   . History of ventricular fibrillation   . HTN (hypertension)   . Hx-sudden cardiac arrest   . Ischemic cardiomyopathy   . LBBB (left bundle branch block)   . Ventricular tachycardia [facility])    Past Surgical History:  Procedure Laterality Date  . CARDIAC CATHETERIZATION  10/12/2008  . CARDIAC DEFIBRILLATOR PLACEMENT     St Jude  . CARDIOVERSION N/A 06/28/2019   Procedure: CARDIOVERSION;  Surgeon: Lelon Perla, MD;  Location: [facility] ENDOSCOPY;  Service: Cardiovascular;  Laterality: N/A;  . DOPPLER ECHOCARDIOGRAPHY  2008, 2011  . EP IMPLANTABLE DEVICE N/A 05/19/2014   Procedure: ICD/BIV ICD Generator Changeout;  Surgeon: Evans Lance, MD;  Location: [facility] CV LAB;  Service:  Cardiovascular;  Laterality: N/A;  . TONSILECTOMY, ADENOIDECTOMY, BILATERAL MYRINGOTOMY AND TUBES      Current Outpatient Medications  Medication Sig Dispense Refill  . amiodarone (PACERONE) 200 MG tablet Take 1 tablet (200 mg total) by mouth daily. 90 tablet 3  . apixaban (ELIQUIS) 5 MG TABS tablet Take 1 tablet (5 mg total) by mouth 2 (two) times daily. 60 tablet 11  . atorvastatin (LIPITOR) 20 MG tablet TAKE 1 TABLET BY MOUTH DAILY AT BEDTIME 90 tablet 3  . carvedilol (COREG) 25 MG tablet TAKE 1 TABLET TWICE DAILY WITH A MEAL. 180 tablet 3  . Cholecalciferol (VITAMIN D-3) 1000 UNITS CAPS Take 1,000 Units by mouth daily.     . furosemide (LASIX) 40 MG tablet Take 1 tablet (40 mg total) by mouth daily. Hold dosage if weight < 183 pounds 90 tablet 3  . indomethacin (INDOCIN SR) 75 MG CR capsule Take 75 mg by mouth daily as needed (gout attacks).    . isosorbide mononitrate (IMDUR) 30 MG 24 hr tablet Take 0.5 tablets (15 mg total) by mouth daily. 45 tablet 3  . losartan (COZAAR) 25 MG tablet Take 0.5 tablets 12.5 mg at Bedtime 90 tablet 3  . omeprazole (PRILOSEC) 20 MG capsule TAKE 1 CAPSULE DAILY 90 capsule 1  . phenylephrine (SUDAFED PE) 10 MG TABS tablet Take 10 mg by mouth as needed (congestion).    [name] spironolactone (ALDACTONE) 25 MG tablet TAKE 1/2 TABLET BY MOUTH ONCE A DAY. 45 tablet 2  .  Tamsulosin HCl (FLOMAX) 0.4 MG CAPS Take 0.4 mg by mouth daily after breakfast.      No current facility-administered medications for this visit.    Allergies:   Patient has no known allergies.   Social History: Social History   Socioeconomic History  . Marital status: Divorced    Spouse name: Not on file  . Number of children: Not on file  . Years of education: Not on file  . Highest education level: Not on file  Occupational History  . Occupation: Web designer  Tobacco Use  . Smoking status: Former Smoker    Quit date: 01/03/1985    Years since quitting: 34.7  . Smokeless  tobacco: Never Used  Vaping Use  . Vaping Use: Never used  Substance and Sexual Activity  . Alcohol use: Never  . Drug use: Never  . Sexual activity: Not on file  Other Topics Concern  . Not on file  Social History Narrative  . Not on file   Social Determinants of Health   Financial Resource Strain:   . Difficulty of Paying Living Expenses: Not on file  Food Insecurity: No Food Insecurity  . Worried About Charity fundraiser in the Last Year: Never true  . Ran Out of Food in the Last Year: Never true  Transportation Needs: No Transportation Needs  . Lack of Transportation (Medical): No  . Lack of Transportation (Non-Medical): No  Physical Activity:   . Days of Exercise per Week: Not on file  . Minutes of Exercise per Session: Not on file  Stress:   . Feeling of Stress : Not on file  Social Connections:   . Frequency of Communication with Friends and Family: Not on file  . Frequency of Social Gatherings with Friends and Family: Not on file  . Attends Religious Services: Not on file  . Active Member of Clubs or Organizations: Not on file  . Attends Archivist Meetings: Not on file  . Marital Status: Not on file  Intimate Partner Violence:   . Fear of Current or Ex-Partner: Not on file  . Emotionally Abused: Not on file  . Physically Abused: Not on file  . Sexually Abused: Not on file    Family History: Family History  Problem Relation Age of Onset  . Coronary artery disease Other     Review of Systems: All other systems reviewed and are otherwise negative except as noted above.   Physical Exam: Vitals:   10/04/19 0830  BP: 112/70  Pulse: 61  SpO2: 97%  Weight: 192 lb (87.1 kg)  Height: 5\' 7"  (1.702 m)     GEN- The patient is well appearing, alert and oriented x 3 today.   HEENT: normocephalic, atraumatic; sclera clear, conjunctiva pink; hearing intact; oropharynx clear; neck supple, no JVP Lymph- no cervical lymphadenopathy Lungs- Clear to  ausculation bilaterally, normal work of breathing.  No wheezes, rales, rhonchi Heart- Regular rate and rhythm, no murmurs, rubs or gallops, PMI not laterally displaced GI- soft, non-tender, non-distended, bowel sounds present, no hepatosplenomegaly Extremities- no clubbing or cyanosis. No edema; DP/PT/radial pulses 2+ bilaterally MS- no significant deformity or atrophy Skin- warm and dry, no rash or lesion; ICD pocket well healed Psych- euthymic mood, full affect Neuro- strength and sensation are intact  ICD interrogation- reviewed in detail today,  See PACEART report  EKG:  EKG is not ordered today.  Recent Labs: 11/15/2018: B Natriuretic Peptide 725.7 03/18/2019: ALT 25; TSH 2.960 06/18/2019: BUN 16;  Creatinine, Ser 1.28; Hemoglobin 11.7; Magnesium 2.1; Platelets 193; Potassium 4.6; Sodium 141   Wt Readings from Last 3 Encounters:  10/04/19 192 lb (87.1 kg)  07/12/19 191 lb (86.6 kg)  06/28/19 195 lb (88.5 kg)     Other studies Reviewed: Additional studies/ records that were reviewed today include: Previous EP and AF office notes.    Assessment and Plan:  1.  Chronic systolic dysfunction s/p St. Jude CRT-D  euvolemic today Stable on an appropriate medical regimen Normal ICD function See Pace Art report No changes today  2. VT No further  3. CAD Denies ischemic symptoms  4. Persistent AF He is in NSR today. AF burden ~28%. No change. Continue eliquis for CHA2DS2VASC of at least 5.   Continue amiodarone.  5. Deconditioning Multifactorial.  Current medicines are reviewed at length with the patient today.   The patient does not have concerns regarding his medicines.  The following changes were made today:  none  Labs/ tests ordered today include:  No orders of the defined types were placed in this encounter.  Disposition:   Follow up with Dr. Lovena Le or EP APP in  6 months. Would upgrade battery checks to monthly after next remote.   Jacalyn Lefevre, PA-C  10/04/2019 9:19 AM  [facility] [address] [place] [address] [phone] (office) [phone] (fax)

## 2019-10-04 ENCOUNTER — Other Ambulatory Visit: Payer: Self-pay

## 2019-10-04 ENCOUNTER — Encounter: Payer: Self-pay | Admitting: Student

## 2019-10-04 ENCOUNTER — Ambulatory Visit (INDEPENDENT_AMBULATORY_CARE_PROVIDER_SITE_OTHER): Payer: Medicare Other | Admitting: Student

## 2019-10-04 VITALS — BP 112/70 | HR 61 | Ht 67.0 in | Wt 192.0 lb

## 2019-10-04 DIAGNOSIS — I2510 Atherosclerotic heart disease of native coronary artery without angina pectoris: Secondary | ICD-10-CM | POA: Diagnosis not present

## 2019-10-04 DIAGNOSIS — I472 Ventricular tachycardia: Secondary | ICD-10-CM | POA: Diagnosis not present

## 2019-10-04 DIAGNOSIS — I10 Essential (primary) hypertension: Secondary | ICD-10-CM

## 2019-10-04 DIAGNOSIS — I5022 Chronic systolic (congestive) heart failure: Secondary | ICD-10-CM

## 2019-10-04 DIAGNOSIS — I480 Paroxysmal atrial fibrillation: Secondary | ICD-10-CM

## 2019-10-04 DIAGNOSIS — I4729 Other ventricular tachycardia: Secondary | ICD-10-CM

## 2019-10-04 LAB — CUP PACEART INCLINIC DEVICE CHECK
Battery Remaining Longevity: 13 mo
Brady Statistic RA Percent Paced: 72 %
Brady Statistic RV Percent Paced: 95 %
Date Time Interrogation Session: 20211001091508
HighPow Impedance: 49.875
Implantable Lead Implant Date: 20030319
Implantable Lead Implant Date: 20030319
Implantable Lead Implant Date: 20110211
Implantable Lead Location: 753858
Implantable Lead Location: 753859
Implantable Lead Location: 753860
Implantable Lead Model: 158
Implantable Lead Model: 4087
Implantable Lead Serial Number: 113412
Implantable Lead Serial Number: 159867
Implantable Pulse Generator Implant Date: 20160516
Lead Channel Impedance Value: 387.5 Ohm
Lead Channel Impedance Value: 425 Ohm
Lead Channel Impedance Value: 800 Ohm
Lead Channel Pacing Threshold Amplitude: 0.75 V
Lead Channel Pacing Threshold Amplitude: 0.75 V
Lead Channel Pacing Threshold Amplitude: 1 V
Lead Channel Pacing Threshold Amplitude: 1 V
Lead Channel Pacing Threshold Amplitude: 1.375 V
Lead Channel Pacing Threshold Pulse Width: 0.5 ms
Lead Channel Pacing Threshold Pulse Width: 0.5 ms
Lead Channel Pacing Threshold Pulse Width: 1 ms
Lead Channel Pacing Threshold Pulse Width: 1 ms
Lead Channel Pacing Threshold Pulse Width: 1 ms
Lead Channel Sensing Intrinsic Amplitude: 0.5 mV
Lead Channel Sensing Intrinsic Amplitude: 12 mV
Lead Channel Setting Pacing Amplitude: 2 V
Lead Channel Setting Pacing Amplitude: 2.5 V
Lead Channel Setting Pacing Amplitude: 2.5 V
Lead Channel Setting Pacing Pulse Width: 0.5 ms
Lead Channel Setting Pacing Pulse Width: 1 ms
Lead Channel Setting Sensing Sensitivity: 0.5 mV
Pulse Gen Serial Number: 7247398

## 2019-10-04 NOTE — Patient Instructions (Addendum)
Medication Instructions:  *If you need a refill on your cardiac medications before your next appointment, please call your pharmacy*  Follow-Up: At [facility], you and your health needs are our priority.  As part of our continuing mission to provide you with exceptional heart care, we have created designated Provider Care Teams.  These Care Teams include your primary Cardiologist (physician) and Advanced Practice Providers (APPs -  Physician Assistants and Nurse Practitioners) who all work together to provide you with the care you need, when you need it.  We recommend signing up for the patient portal called "MyChart".  Sign up information is provided on this After Visit Summary.  MyChart is used to connect with patients for Virtual Visits (Telemedicine).  Patients are able to view lab/test results, encounter notes, upcoming appointments, etc.  Non-urgent messages can be sent to your provider as well.   To learn more about what you can do with MyChart, go to [email or web address]://www.mychart.com.    Your next appointment:   Your physician wants you to follow-up in: 6 MONTHS with Dr. Taylor. You will receive a reminder letter in the mail two months in advance. If you don't receive a letter, please call our office to schedule the follow-up appointment.  The format for your next appointment:   In Person with Gregg Taylor, MD     

## 2019-10-09 ENCOUNTER — Other Ambulatory Visit: Payer: Self-pay | Admitting: *Deleted

## 2019-10-09 NOTE — Patient Outreach (Signed)
Mark Nguyen) Care Management  Mark Nguyen  10/09/2019   TAITUM ALMS [birth year] [id number]  RN Health Coach telephone call to patient.  [employer] compliance verified. Per patient he is taking all his medications without any problem. Patient is on Eliquis and was worried about renewal of it thru [facility]. RN made patient aware she would notify [place]. Patient is able to afford all other medications. Patient has been following up with remote checks.He has not had any problems since last cardioversion  Per patient he is to have the battery changed next year and has an appointment. He is able to make all appointments and is driving. Patient stated he has a good appetite and loves to do his own cooking. Patient weighs and monitors blood pressure. Patient has not had any recent falls. Patient is very active with mowing yard and doing yard work. Patient has agreed to follow up outreach calls.    Encounter Medications:  Outpatient Encounter Medications as of 10/09/2019  Medication Sig  . amiodarone (PACERONE) 200 MG tablet Take 1 tablet (200 mg total) by mouth daily.  Mark Nguyen apixaban (ELIQUIS) 5 MG TABS tablet Take 1 tablet (5 mg total) by mouth 2 (two) times daily.  Mark Nguyen atorvastatin (LIPITOR) 20 MG tablet TAKE 1 TABLET BY MOUTH DAILY AT BEDTIME  . carvedilol (COREG) 25 MG tablet TAKE 1 TABLET TWICE DAILY WITH A MEAL.  Mark Nguyen Cholecalciferol (VITAMIN D-3) 1000 UNITS CAPS Take 1,000 Units by mouth daily.   . furosemide (LASIX) 40 MG tablet Take 1 tablet (40 mg total) by mouth daily. Hold dosage if weight < 183 pounds  . indomethacin (INDOCIN SR) 75 MG CR capsule Take 75 mg by mouth daily as needed (gout attacks).  . isosorbide mononitrate (IMDUR) 30 MG 24 hr tablet Take 0.5 tablets (15 mg total) by mouth daily.  Mark Nguyen losartan (COZAAR) 25 MG tablet Take 0.5 tablets 12.5 mg at Bedtime  . omeprazole (PRILOSEC) 20 MG capsule TAKE 1 CAPSULE DAILY  . phenylephrine (SUDAFED PE) 10 MG TABS tablet Take  10 mg by mouth as needed (congestion).  Mark Nguyen spironolactone (ALDACTONE) 25 MG tablet TAKE 1/2 TABLET BY MOUTH ONCE A DAY.  . Tamsulosin HCl (FLOMAX) 0.4 MG CAPS Take 0.4 mg by mouth daily after breakfast.    No facility-administered encounter medications on file as of 10/09/2019.    Functional Status:  In your present state of health, do you have any difficulty performing the following activities: 02/19/2019 01/25/2019  Hearing? N Y  Vision? N -  Difficulty concentrating or making decisions? N -  Walking or climbing stairs? Y -  Dressing or bathing? N N  Doing errands, shopping? N N  Preparing Food and eating ? N N  Using the Toilet? N N  In the past six months, have you accidently leaked urine? N -  Do you have problems with loss of bowel control? N -  Managing your Medications? N N  Managing your Finances? N N  Housekeeping or managing your Housekeeping? N -  Some recent data might be hidden    Fall/Depression Screening: Fall Risk  06/10/2019 02/19/2019 11/28/2018  Falls in the past year? 0 0 0  Number falls in past yr: 0 0 -  Injury with Fall? 0 0 -  Risk for fall due to : - - Impaired balance/gait  Follow up Falls evaluation completed;Falls prevention discussed Falls evaluation completed;Falls prevention discussed -   PHQ 2/9 Scores 09/10/2019 06/10/2019 03/06/2019 02/19/2019 02/05/2019 01/25/2019 12/07/2018  PHQ - 2 Score 0 0 0 0 0 0 0  PHQ- 9 Score - - - - - - -    Assessment:  Goals Addressed              This Visit's Progress   .  [facility]) Patient will be able to verbalize with outreach interventions to manage his Afib, CHF & HTn at home (pt-stated)   On track     [place] (see longitudinal plan of care for additional care plan information)   Current Barriers:  . Cognitive deficits . Knowledge deficit related to basic atrial fibrillation, congestive Heart Failure (CHF) and hypertension pathophysiology and self care management     Case Manager Clinical Goal(s):  Mark Nguyen Over  the next  30 days patient will interact with Mark Nguyen disease manager/health coach . Over the next 90 days, patient will verbalize understanding of atrial fibrillation Action Plan and when to call doctor -09/10/19 continues to progress and follow his Afib & CHF action plan but continues to need more knowledge of continued home care    Interventions:  . Checked on the status of his Eliquis medication assistance needs  . Basic overview and discussion of pathophysiology of atrial fibrillation and CHF reviewed  . Provided written education on heart healthy diet . Reviewed atrial fibrillation and CHF Action Plans in depth and provided written copy . Review use of anticoagulant- Eliquis . Reviewed June 2021 cardioversion for further rhythm issues . Reviewed April 2021 letter with change of cardiology staff from  Jarome Matin to Dr Jodell Cipro . Reviewed  [facility] disease management/health coach services and transferred   Patient Self Care Activities:  . Continue to check weight daily for CHF and report weight gains of greater than 3-5 lbs to cardiologist . Takes atrial fibrillation  Medications as prescribed . Verbalizes understanding of and follows atrial fibrillation  Action Plan . Adheres to low sodium diet . Will contact local urologists prn (list of urologists provided in June 2021-see [facility] RN CM note)   Patient is checking weight, taking medications, Following up with appointments     .  Make and Keep All Appointments        Follow Up Date [id number]   - call to cancel if needed - keep a calendar with prescription refill dates - keep a calendar with appointment dates    Why is this important?   Part of staying healthy is seeing the doctor for follow-up care.  If you forget your appointments, there are some things you can do to stay on track.    Notes:  Patient is planning ahead on renewing application for Eliquis before the expiration date    .  [place] My Quality of Life        Follow Up Date  [id number]   - spend time outdoors at least 3 times a week    Why is this important?   Having a long-term illness can be scary.  It can also be stressful for you and your caregiver.  These steps may help.    Notes:  Patient loves mowing and taking care of yard.  He loves to do his own cooking and cooks for family to come and eat dinner    .  Track and Manage Heart Rate and Rhythm        Follow Up Date [id number]   - make a plan to eat healthy - take medicine as prescribed    Why is this important?  Atrial fibrillation may have no symptoms. Sometimes the symptoms get worse or happen more often.  It is important to keep track of what your symptoms are and when they happen.  A change in symptoms is important to discuss with your doctor or nurse.  Being active and healthy eating will also help you manage your heart condition.     Notes: Remote checks q 3 months       Plan:  Referred to pharmacy Discussed mild exercises Discussed medication adherence Discussed healthy eating Discussed low sodium diet RN sent sheet on foods high and low in sodium RN sent Atrial Fib booklet RN will send barriers letter and assessment to PCP  [place] [facility] [phone]  RN will follow up within the month of December

## 2019-10-17 ENCOUNTER — Other Ambulatory Visit: Payer: Self-pay | Admitting: Internal Medicine

## 2019-10-18 MED ORDER — OMEPRAZOLE 20 MG PO CPDR: 20.0000 mg | DELAYED_RELEASE_CAPSULE | Freq: Every day | ORAL | 1 refills | Status: DC

## 2019-10-18 NOTE — Addendum Note (Signed)
Addended by: Gaetano Net on: 10/18/2019 02:24 PM   Modules accepted: Orders

## 2019-10-18 NOTE — Telephone Encounter (Signed)
OK to fill

## 2019-10-22 ENCOUNTER — Telehealth: Payer: Self-pay

## 2019-10-22 NOTE — Telephone Encounter (Signed)
Left a voicemail for patient today regarding re-enrollment for the [employer] Patient [employer] for his Eliquis.  Mailed patient portion of application to patient today with instructions to return with proof of income.

## 2019-11-18 ENCOUNTER — Telehealth: Payer: Self-pay | Admitting: *Deleted

## 2019-11-18 NOTE — Telephone Encounter (Signed)
Thank you so much as usual kelly, you are great!

## 2019-11-18 NOTE — Telephone Encounter (Signed)
Spoke to patient today and patient states he is going to come by the [facility] today so I can go over the application with him.

## 2019-11-18 NOTE — Telephone Encounter (Signed)
Could you please call pt and assist him with PA forms for eliquis Please call him at [phone], states he can come to office if needed

## 2019-11-26 ENCOUNTER — Telehealth: Payer: Self-pay

## 2019-11-26 NOTE — Telephone Encounter (Signed)
FAXED APPLICATION FOR PATIENT ASSISTANCE TO [employer] FOR ELIQUIS TODAY.

## 2019-12-03 ENCOUNTER — Other Ambulatory Visit: Payer: Self-pay | Admitting: *Deleted

## 2019-12-03 NOTE — Patient Outreach (Signed)
[facility]) Care Management  12/03/2019  Mark Nguyen [birth year] [id number]   [place] coordination  [facility] RN CM received a call from patient requesting assistance to connect with [place] staff Completed a conference call to [facility] [facility]) [phone] but when transferred to the pharmacy there was not connection [facility] RN CM outreached to [employer] via Teams after disconnecting with patient and discussed pt requesting a call to review an application for eliquis  Marisa Severin to outreach to patient as his number was verified with her     [place]. Lavina Hamman, RN, BSN, [place] Coordinator Office number [phone] Mobile number [phone]  Main [facility] number [phone] Fax number [phone]

## 2019-12-09 ENCOUNTER — Telehealth: Payer: Self-pay

## 2019-12-09 NOTE — Telephone Encounter (Signed)
Spoke with patient and patient aware that his re-enrollment is still processing for Eliquis.  Advised patient that per [employer] PAF application will not be approved until 2022, BMS is requiring that patient spend approximately $300 out-of-pocket(OOP) and then provide a print-out from his pharmacy for 2022 showing OOP.  Patient states that once he reaches that amount he will bring in his OOP printout so we can fax it to [employer] to complete the re-enrollment process.  Patient still has meds from last shipment delivered in 11/2019 to get him through January.

## 2019-12-10 ENCOUNTER — Ambulatory Visit (INDEPENDENT_AMBULATORY_CARE_PROVIDER_SITE_OTHER): Payer: Medicare Other

## 2019-12-10 DIAGNOSIS — I469 Cardiac arrest, cause unspecified: Secondary | ICD-10-CM | POA: Diagnosis not present

## 2019-12-10 LAB — CUP PACEART REMOTE DEVICE CHECK
Battery Remaining Longevity: 16 mo
Battery Remaining Percentage: 25 %
Battery Voltage: 2.81 V
Brady Statistic AP VP Percent: 99 %
Brady Statistic AP VS Percent: 1 %
Brady Statistic AS VP Percent: 1 %
Brady Statistic AS VS Percent: 1 %
Brady Statistic RA Percent Paced: 51 %
Date Time Interrogation Session: 20211207020019
HighPow Impedance: 47 Ohm
HighPow Impedance: 47 Ohm
Implantable Lead Implant Date: 20030319
Implantable Lead Implant Date: 20030319
Implantable Lead Implant Date: 20110211
Implantable Lead Location: 753858
Implantable Lead Location: 753859
Implantable Lead Location: 753860
Implantable Lead Model: 158
Implantable Lead Model: 4087
Implantable Lead Serial Number: 113412
Implantable Lead Serial Number: 159867
Implantable Pulse Generator Implant Date: 20160516
Lead Channel Impedance Value: 390 Ohm
Lead Channel Impedance Value: 430 Ohm
Lead Channel Impedance Value: 710 Ohm
Lead Channel Pacing Threshold Amplitude: 0.75 V
Lead Channel Pacing Threshold Amplitude: 1 V
Lead Channel Pacing Threshold Amplitude: 2.375 V
Lead Channel Pacing Threshold Pulse Width: 0.5 ms
Lead Channel Pacing Threshold Pulse Width: 1 ms
Lead Channel Pacing Threshold Pulse Width: 1 ms
Lead Channel Sensing Intrinsic Amplitude: 0.5 mV
Lead Channel Sensing Intrinsic Amplitude: 11.9 mV
Lead Channel Setting Pacing Amplitude: 2.5 V
Lead Channel Setting Pacing Amplitude: 2.5 V
Lead Channel Setting Pacing Amplitude: 2.875
Lead Channel Setting Pacing Pulse Width: 0.5 ms
Lead Channel Setting Pacing Pulse Width: 1 ms
Lead Channel Setting Sensing Sensitivity: 0.5 mV
Pulse Gen Serial Number: 7247398

## 2019-12-11 ENCOUNTER — Other Ambulatory Visit: Payer: Self-pay | Admitting: *Deleted

## 2019-12-11 NOTE — Patient Outreach (Signed)
[facility]) Care Management  12/11/2019  SERAPIO EDELSON 24-Apr-1931 [id number]   RN Health Coach attempted follow up outreach call to patient.  Patient was unavailable. HIPPA compliance voicemail message left with return callback number.  Plan: RN will call patient again within 30 days.  [place] [facility] [phone]

## 2019-12-20 NOTE — Progress Notes (Signed)
Remote ICD transmission.   

## 2019-12-23 ENCOUNTER — Other Ambulatory Visit: Payer: Self-pay | Admitting: Internal Medicine

## 2019-12-30 ENCOUNTER — Telehealth: Payer: Self-pay | Admitting: Internal Medicine

## 2019-12-30 NOTE — Telephone Encounter (Signed)
Returned call to patient who states he has had left shoulder pain onset 2 weeks ago. States it occurs intermittently and feels like a "toothache." Denies pain at present. Denies chest discomfort or SOB. States he wonders if the discomfort occurs when he is out of rhythm because he has been told that he is out of rhythm about 30% of the time. Pain will occasionally radiate into his left arm, no pain or discomfort at present. Pain mostly relieved by 650 mg acetaminophen. He states he does not know how to send a report to the device team. I advised that I will forward message for review by device team and that someone from our office will call him back. He verbalized understanding and agreement with plan and thanked me for calling him.

## 2019-12-30 NOTE — Telephone Encounter (Signed)
Patient has been having some shoulder pain and wanted to be checked out by Dr. Lovena Le or Oda Kilts. Patient said he has not had any chest pain or other symptoms, but his family advised him to call Dr. Lovena Le. Please call the patient to discuss symptoms

## 2019-12-30 NOTE — Telephone Encounter (Signed)
Forwarding to Dr. Ladona Ridgel for advice.

## 2019-12-30 NOTE — Telephone Encounter (Signed)
Spoke with pt, attempted to assist with manual transmission.  Required assistance from tech suppport.  During call to obtain manual transmission, pt did not report any additional symptoms besides what is previously reported.    Transmission received, pt in AF with Vrates controlled/ Bi V paced.  Ongoing AF since mid November.    Current AF Burden 63%.

## 2020-01-01 NOTE — Telephone Encounter (Signed)
Pt will follow up with EP APP

## 2020-01-02 ENCOUNTER — Encounter: Payer: Self-pay | Admitting: Physician Assistant

## 2020-01-02 ENCOUNTER — Other Ambulatory Visit: Payer: Self-pay

## 2020-01-02 ENCOUNTER — Ambulatory Visit (INDEPENDENT_AMBULATORY_CARE_PROVIDER_SITE_OTHER): Payer: Medicare Other | Admitting: Physician Assistant

## 2020-01-02 VITALS — BP 122/70 | HR 71 | Ht 67.0 in | Wt 197.0 lb

## 2020-01-02 DIAGNOSIS — I2510 Atherosclerotic heart disease of native coronary artery without angina pectoris: Secondary | ICD-10-CM

## 2020-01-02 DIAGNOSIS — I472 Ventricular tachycardia, unspecified: Secondary | ICD-10-CM

## 2020-01-02 DIAGNOSIS — Z01812 Encounter for preprocedural laboratory examination: Secondary | ICD-10-CM | POA: Diagnosis not present

## 2020-01-02 DIAGNOSIS — I428 Other cardiomyopathies: Secondary | ICD-10-CM

## 2020-01-02 DIAGNOSIS — R079 Chest pain, unspecified: Secondary | ICD-10-CM

## 2020-01-02 DIAGNOSIS — I4819 Other persistent atrial fibrillation: Secondary | ICD-10-CM | POA: Diagnosis not present

## 2020-01-02 DIAGNOSIS — Z95810 Presence of automatic (implantable) cardiac defibrillator: Secondary | ICD-10-CM | POA: Diagnosis not present

## 2020-01-02 DIAGNOSIS — I5022 Chronic systolic (congestive) heart failure: Secondary | ICD-10-CM

## 2020-01-02 MED ORDER — AMIODARONE HCL 200 MG PO TABS: 200.0000 mg | ORAL_TABLET | Freq: Two times a day (BID) | ORAL | 3 refills | Status: DC

## 2020-01-02 MED ORDER — ISOSORBIDE MONONITRATE ER 30 MG PO TB24: 30.0000 mg | ORAL_TABLET | Freq: Every day | ORAL | 3 refills | Status: DC

## 2020-01-02 NOTE — Progress Notes (Signed)
Cardiology Office Note Date:  01/02/2020  Patient ID:  Mark, Nguyen 08-07-1931, MRN [id number] PCP:  Steffanie Rainwater, MD  Cardiologist/Electrophysiologist: Dr. Ladona Ridgel    Chief Complaint: increased AF   History of Present Illness: Mark Nguyen is a 86 y.o. male with history of LBBB, ICM, chronic CHF (systolic), HTN, HLD, VT, ICD,  CAD (chart reveiw mentions he had hx of remote MI and PCI 1992)   He had a stress test Jan 2020, abnormal with large fixed defect and low EF, started on Imdur with a note mentioning if ongoing CP would need cath.  He comes in today to be seen for Dr. Ladona Ridgel, last seen by him Sept 2020, at that time doing well, no VT, his amio reduced.  Most recently saw A. Tillery, PA for increased AFib burden Oct 2021.  The pt reported some SOB that attributed this to sinus issues.  He wwas in SR that day, burden 28%, reported as unchanged and no adjustments were made. Described as deconditioned.  Telephone notes report pt called with some shoulder pain and asked for an appt, wondered about his heart rhythm, this prompted a manual transmission noting an ongoing AF episode since Nov and was given an appt.  TODAY He comes accompanied by his grandson, the patient is very HOH. He states that for abut 2 weeks or so he has had a pulsating discomfort that seems to start in his shoulder and radiates to his scapula and down his arm.  It is a discmofort/pain, it also radiates towards his ICD. IT can happen seated or walking around. Moving his arm in any way does not seem to provoke or change it. Walking/exertion does not trigger it necessarily or change it when it is happening. It has been happening every day for at least 2 weeks, comes and goes.  Lasts 15-30 minutes. No associated SOB, diaphoresis, nausea. He has noticed that when he presses lightly on his ICD this helps and seems to ease it off.  He does not have palpitations or CP otherwise. No near syncope or  syncope. No rest SOB, he get winded when walking up the small hill by his house, this is not new and he says he figures reasonable for an 86 y/o.  He says that when he had his MI his arm hurt, he says it was a nagging ache that over a couple days worsened to a severe discomfort and pain.  This is why he became concerned about his current symptom.  No bleeding or signs of bleeding   Device information St. Jude BiV ICD implanted 2003 -> Biv upgrade 2011, gen change 05/2014 for CHF History of appropriate therapy: Yes History of AAD therapy: Yes; currently on amiodarone    Past Medical History:  Diagnosis Date  . CAD (coronary artery disease)   . CHF (congestive heart failure) (HCC)   . Dyslipidemia   . History of ventricular fibrillation   . HTN (hypertension)   . Hx-sudden cardiac arrest   . Ischemic cardiomyopathy   . LBBB (left bundle branch block)   . Ventricular tachycardia [facility])     Past Surgical History:  Procedure Laterality Date  . CARDIAC CATHETERIZATION  10/12/2008  . CARDIAC DEFIBRILLATOR PLACEMENT     St Jude  . CARDIOVERSION N/A 06/28/2019   Procedure: CARDIOVERSION;  Surgeon: Lewayne Bunting, MD;  Location: [facility] ENDOSCOPY;  Service: Cardiovascular;  Laterality: N/A;  . DOPPLER ECHOCARDIOGRAPHY  2008, 2011  . EP IMPLANTABLE DEVICE N/A  05/19/2014   Procedure: ICD/BIV ICD Generator Changeout;  Surgeon: Evans Lance, MD;  Location: [facility];  Service: Cardiovascular;  Laterality: N/A;  . TONSILECTOMY, ADENOIDECTOMY, BILATERAL MYRINGOTOMY AND TUBES      Current Outpatient Medications  Medication Sig Dispense Refill  . apixaban (ELIQUIS) 5 MG TABS tablet Take 1 tablet (5 mg total) by mouth 2 (two) times daily. 60 tablet 11  . atorvastatin (LIPITOR) 20 MG tablet TAKE 1 TABLET BY MOUTH DAILY AT BEDTIME 90 tablet 3  . carvedilol (COREG) 25 MG tablet TAKE 1 TABLET TWICE DAILY WITH A MEAL. 180 tablet 3  . Cholecalciferol (VITAMIN D-3) 1000 UNITS CAPS Take 1,000  Units by mouth daily.     . furosemide (LASIX) 40 MG tablet Take 1 tablet (40 mg total) by mouth daily. Hold dosage if weight < 183 pounds 90 tablet 3  . indomethacin (INDOCIN SR) 75 MG CR capsule Take 75 mg by mouth daily as needed (gout attacks).    [name] losartan (COZAAR) 25 MG tablet Take 0.5 tablets 12.5 mg at Bedtime 90 tablet 3  . omeprazole (PRILOSEC) 20 MG capsule Take 1 capsule (20 mg total) by mouth daily. 90 capsule 1  . phenylephrine (SUDAFED PE) 10 MG TABS tablet Take 10 mg by mouth as needed (congestion).    [name] spironolactone (ALDACTONE) 25 MG tablet TAKE 1/2 TABLET BY MOUTH ONCE A DAY. 45 tablet 2  . Tamsulosin HCl (FLOMAX) 0.4 MG CAPS Take 0.4 mg by mouth daily after breakfast.     . amiodarone (PACERONE) 200 MG tablet Take 1 tablet (200 mg total) by mouth 2 (two) times daily. 180 tablet 3  . isosorbide mononitrate (IMDUR) 30 MG 24 hr tablet Take 1 tablet (30 mg total) by mouth daily. 90 tablet 3   No current facility-administered medications for this visit.    Allergies:   Patient has no known allergies.   Social History:  The patient  reports that he quit smoking about 35 years ago. He has never used smokeless tobacco. He reports that he does not drink alcohol and does not use drugs.   Family History:  The patient's family history includes Coronary artery disease in an other family member.  ROS:  Please see the history of present illness.    All other systems are reviewed and otherwise negative.   PHYSICAL EXAM:  VS:  BP 122/70   Pulse 71   Ht 5\' 7"  (1.702 m)   Wt 197 lb (89.4 kg)   SpO2 98%   BMI 30.85 kg/m  BMI: Body mass index is 30.85 kg/m. Well nourished, well developed, in no acute distress HEENT: normocephalic, atraumatic Neck: no JVD, carotid bruits or masses Cardiac:  RRR; (paced),  no significant murmurs, no rubs, or gallops Lungs:  CTA b/l, no wheezing, rhonchi or rales Abd: soft, nontender MS: no deformity, age appropriate atrophy Ext:  no edema Skin:  warm and dry, no rash Neuro:  No gross deficits appreciated Psych: euthymic mood, full affect  ICD site is stable, no tethering or discomfort   EKG:  Done today and reviewed by myself shows  AFlutter/ Vpaced, 71bpm, PVC  Device interrogation done today and reviewed by myself:  Battery and lead measurements are stable AF burden is 64%m current episode since Dec 15 Looks like he has been in AFib by histograms since Nov No VT  11/15/2018: TTE IMPRESSIONS  1. There is akinesis of the entire inferior wall, extending into the  apex. 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  Globally the EF is ~35-40%. There is global hypokinesis in addition to the  above regional wall motion  abnormalities. Contrast was used and there is no evidence of thrombus in  the LV apex. This is consistent with a prior inferior wall infarction. The  last echo report from 2011 reports an EF 40-45% but was unable to be  viewed for comparison.  2. Left ventricular diastolic parameters are consistent with Grade III  diastolic dysfunction (restrictive).  3. Elevated left atrial pressure.  4. Definity contrast agent was given IV to delineate the left ventricular  endocardial borders.  5. Mildly dilated left ventricular internal cavity size.  6. The left ventricle demonstrates regional wall motion abnormalities.  7. Global right ventricle has mildly reduced systolic function.The right  ventricular size is normal. No increase in right ventricular wall  thickness.  8. Left atrial size was severely dilated.  9. Right atrial size was moderately dilated.  10. The tricuspid valve is grossly normal. Tricuspid valve regurgitation  is mild.  11. The aortic valve is tricuspid. Aortic valve regurgitation is mild.  Mild to moderate aortic valve sclerosis/calcification without any evidence  of aortic stenosis.  12. The pulmonic valve was grossly normal. Pulmonic valve regurgitation is   trivial.  13. Moderately elevated pulmonary artery systolic pressure.  14. The tricuspid regurgitant velocity is 3.01 m/s, and with an assumed  right atrial pressure of 15 mmHg, the estimated right ventricular systolic  pressure is moderately elevated at 51.2 mmHg.  15. A venous catheter is visualized in the RA and RV.  16. The inferior vena cava is dilated in size with <50% respiratory  variability, suggesting right atrial pressure of 15 mmHg.  17. The mitral valve is degenerative. Mild mitral valve regurgitation.  18. Mild mitral annular calcification.    01/16/2018: stress myoview  The left ventricular ejection fraction is severely decreased (<30%).  Nuclear stress EF: 24%.  There was no ST segment deviation noted during stress.  Defect 1: There is a large defect of severe severity present in the basal inferoseptal, basal inferior, basal inferolateral, mid inferoseptal, mid inferior, apical inferior and apex location.  Findings consistent with prior myocardial infarction.  This is a high risk study.   There is a large irreversible defect in the basal and mid inferoseptal, inferior, basal inferolateral, apical inferior walls and in the true apex consistent with a large infarct in the RCA territory. This study is high risk given large infarct and severe LV dysfunction but there is no ischemia.     Recent Labs: 03/18/2019: ALT 25; TSH 2.960 06/18/2019: BUN 16; Creatinine, Ser 1.28; Hemoglobin 11.7; Magnesium 2.1; Platelets 193; Potassium 4.6; Sodium 141  No results found for requested labs within last 8760 hours.   CrCl cannot be calculated (Patient's most recent lab result is older than the maximum 21 days allowed.).   Wt Readings from Last 3 Encounters:  01/02/20 197 lb (89.4 kg)  10/04/19 192 lb (87.1 kg)  07/12/19 191 lb (86.6 kg)     Other studies reviewed: Additional studies/records reviewed today include: summarized above  ASSESSMENT AND PLAN:  1. ICD      Intact function, no programming changes made  2. NICM 3. Chronic CHF (systolic)     CorVue suggest volume, no edema, lungs are cler     On BB, lasix, nitrate, ARB  Take lasix BID for 2 days then resume daily  4. CAD  CP sounds mostly atypical, though he seems to attach it as similar to his MI pain, though describes them somewhat differently No EKG changes outside of rhythm ?if AF He reports compliance with his Eliquis and no missed doses, says it is very expensive and if he is going to pay all that money he is not going to miss it! Discussed importance of not missing his Eliquis Will increase his amiodarone to 200mg  BID Increase his Imdur to 30mg  daily Plan DCCV  5. Persistent AFib     CHA2DS2Vasc is 5, on eliquis, appropriately dosed     Increased burden of late  7. VT     None noted    Disposition: F/u post DCCV a couple weeks, sooner if needed.  Discussed if hs symptoms escalate he should seek attention.   Current medicines are reviewed at length with the patient today.  The patient did not have any concerns regarding medicines.  Venetia Night, PA-C 01/02/2020 4:35 PM     Mark Nguyen [phone] (office)  [phone] (fax)

## 2020-01-02 NOTE — Patient Instructions (Signed)
Medication Instructions:  Your physician has recommended you make the following change in your medication:  -- FOR THE NEXT 2 DAYS INCREASE Lasix (Furosemide) to twice daily -- (Friday and Saturday) -- INCREASE Amiodarone to twice daily -- Take 1 tablet (200 mg) by mouth twice daily - RX SENT -- INCREASE Isosorbide to 30 mg - Take 1 tablet (30 mg) by mouth daily -- RX SENT *If you need a refill on your cardiac medications before your next appointment, please call your pharmacy*  Lab Work: Your physician has recommended that you have lab work today: BMET and CBC  If you have labs (blood work) drawn today and your tests are completely normal, you will receive your results only by: [name] MyChart Message (if you have MyChart) OR . A paper copy in the mail If you have any lab test that is abnormal or we need to change your treatment, we will call you to review the results. Test/Procedure:  Your physician has recommended that you have a Cardioversion (DCCV). Electrical Cardioversion uses a jolt of electricity to your heart either through paddles or wired patches attached to your chest. This is a controlled, usually prescheduled, procedure. Defibrillation is done under light anesthesia in the hospital, and you usually go home the day of the procedure. This is done to get your heart back into a normal rhythm. You are not awake for the procedure. Please see the instruction sheet given to you today.  Follow-Up: At [facility], you and your health needs are our priority.  As part of our continuing mission to provide you with exceptional heart care, we have created designated Provider Care Teams.  These Care Teams include your primary Cardiologist (physician) and Advanced Practice Providers (APPs -  Physician Assistants and Nurse Practitioners) who all work together to provide you with the care you need, when you need it.  We recommend signing up for the patient portal called "MyChart".  Sign up information  is provided on this After Visit Summary.  MyChart is used to connect with patients for Virtual Visits (Telemedicine).  Patients are able to view lab/test results, encounter notes, upcoming appointments, etc.  Non-urgent messages can be sent to your provider as well.   To learn more about what you can do with MyChart, go to [email or web address].    Your next appointment:   Your physician recommends that you schedule a follow-up appointment in: 3-4 WEEKS with Tommye Standard, PA-C  Remote monitoring is used to monitor your ICD from home. This monitoring reduces the number of office visits required to check your device to one time per year. It allows [name] to keep an eye on the functioning of your device to ensure it is working properly. You are scheduled for a device check from home on 03/10/20. You may send your transmission at any time that day. If you have a wireless device, the transmission will be sent automatically. After your physician reviews your transmission, you will receive a postcard with your next transmission date.   The format for your next appointment:   In Person with You will see one of the following Advanced Practice Providers on your designated Care Team:    Chanetta Marshall, NP  Tommye Standard, PA-C  Legrand Como "[place]" [place], [place]   -- If your symptoms worsen in any way you need to go to the Emergency Room for evaluation --    Your provider has recommended a cardioversion.   You are scheduled for a cardioversion on at 01/14/20 with  Dr. Cristal Deer  Please go to [facility] 2nd Floor Short Stay at 8:00 am.  Enter through the [facility] A Do not have any food or drink after midnight on 01/13/20.  You may take your normal morning medicines EXCEPT FUROSEMIDE with a sip of water on the day of your procedure.  You will need someone to drive you home following your procedure.  DO NOT MISS ANY DOSES OF YOUR Eliquis (Blood Thinner)   Call the [facility]  HeartCare office at [phone] if you have any questions, problems or concerns.     Electrical Cardioversion Electrical cardioversion is the delivery of a jolt of electricity to change the rhythm of the heart. Sticky patches or metal paddles are placed on the chest to deliver the electricity from a device. This is done to restore a normal rhythm. A rhythm that is too fast or not regular keeps the heart from pumping well. Electrical cardioversion is done in an emergency if:   There is low or no blood pressure as a result of the heart rhythm.   Normal rhythm must be restored as fast as possible to protect the brain and heart from further damage.   It may save a life. Cardioversion may be done for heart rhythms that are not immediately life threatening, such as atrial fibrillation or flutter, in which:   The heart is beating too fast or is not regular.   Medicine to change the rhythm has not worked.   It is safe to wait in order to allow time for preparation.  Symptoms of the abnormal rhythm are bothersome.  The risk of stroke and other serious problems can be reduced.  LET [facility] CARE PROVIDER KNOW ABOUT:   Any allergies you have.  All medicines you are taking, including vitamins, herbs, eye drops, creams, and over-the-counter medicines.  Previous problems you or members of your family have had with the use of anesthetics.   Any blood disorders you have.   Previous surgeries you have had.   Medical conditions you have.   RISKS AND COMPLICATIONS  Generally, this is a safe procedure. However, problems can occur and include:   Breathing problems related to the anesthetic used.  A blood clot that breaks free and travels to other parts of your body. This could cause a stroke or other problems. The risk of this is lowered by use of blood-thinning medicine (anticoagulant) prior to the procedure.  Cardiac arrest (rare).   BEFORE THE PROCEDURE   You may have  tests to detect blood clots in your heart and to evaluate heart function.  You may start taking anticoagulants so your blood does not clot as easily.   Medicines may be given to help stabilize your heart rate and rhythm.   PROCEDURE  You will be given medicine through an IV tube to reduce discomfort and make you sleepy (sedative).   An electrical shock will be delivered.   AFTER THE PROCEDURE Your heart rhythm will be watched to make sure it does not change. You will need someone to drive you home

## 2020-01-03 LAB — BASIC METABOLIC PANEL
BUN/Creatinine Ratio: 13 (ref 10–24)
BUN: 18 mg/dL (ref 8–27)
CO2: 23 mmol/L (ref 20–29)
Calcium: 8.9 mg/dL (ref 8.6–10.2)
Chloride: 107 mmol/L — ABNORMAL HIGH (ref 96–106)
Creatinine, Ser: 1.43 mg/dL — ABNORMAL HIGH (ref 0.76–1.27)
GFR calc Af Amer: 51 mL/min/{1.73_m2} — ABNORMAL LOW (ref >59)
GFR calc non Af Amer: 44 mL/min/{1.73_m2} — ABNORMAL LOW (ref >59)
Glucose: 123 mg/dL — ABNORMAL HIGH (ref 65–99)
Potassium: 4.1 mmol/L (ref 3.5–5.2)
Sodium: 142 mmol/L (ref 134–144)

## 2020-01-03 LAB — CBC WITH DIFFERENTIAL/PLATELET
Basophils Absolute: 0.1 10*3/uL (ref 0.0–0.2)
Basos: 1 %
EOS (ABSOLUTE): 0.2 10*3/uL (ref 0.0–0.4)
Eos: 2 %
Hematocrit: 36.5 % — ABNORMAL LOW (ref 37.5–51.0)
Hemoglobin: 11.7 g/dL — ABNORMAL LOW (ref 13.0–17.7)
Immature Grans (Abs): 0.1 10*3/uL (ref 0.0–0.1)
Immature Granulocytes: 1 %
Lymphocytes Absolute: 1.6 10*3/uL (ref 0.7–3.1)
Lymphs: 20 %
MCH: 27.8 pg (ref 26.6–33.0)
MCHC: 32.1 g/dL (ref 31.5–35.7)
MCV: 87 fL (ref 79–97)
Monocytes Absolute: 0.7 10*3/uL (ref 0.1–0.9)
Monocytes: 9 %
Neutrophils Absolute: 5.2 10*3/uL (ref 1.4–7.0)
Neutrophils: 67 %
Platelets: 165 10*3/uL (ref 150–450)
RBC: 4.21 x10E6/uL (ref 4.14–5.80)
RDW: 14 % (ref 11.6–15.4)
WBC: 7.7 10*3/uL (ref 3.4–10.8)

## 2020-01-13 ENCOUNTER — Other Ambulatory Visit (HOSPITAL_COMMUNITY)
Admission: RE | Admit: 2020-01-13 | Discharge: 2020-01-13 | Disposition: A | Discharge status: Home / Self Care | Payer: Medicare Other | Source: Ambulatory Visit | Attending: Cardiology | Admitting: Cardiology

## 2020-01-13 DIAGNOSIS — U071 COVID-19: Secondary | ICD-10-CM | POA: Diagnosis not present

## 2020-01-13 DIAGNOSIS — Z01812 Encounter for preprocedural laboratory examination: Secondary | ICD-10-CM | POA: Diagnosis not present

## 2020-01-14 ENCOUNTER — Ambulatory Visit (HOSPITAL_COMMUNITY): Admission: RE | Admit: 2020-01-14 | Payer: Medicare Other | Source: Home / Self Care | Admitting: Cardiology

## 2020-01-14 ENCOUNTER — Encounter (HOSPITAL_COMMUNITY): Admission: RE | Payer: Self-pay | Source: Home / Self Care

## 2020-01-14 ENCOUNTER — Encounter (HOSPITAL_COMMUNITY): Payer: Self-pay | Admitting: Certified Registered"

## 2020-01-14 LAB — SARS CORONAVIRUS 2 (TAT 6-24 HRS): SARS Coronavirus 2: POSITIVE — AB

## 2020-01-14 SURGERY — CARDIOVERSION
Anesthesia: General

## 2020-01-14 NOTE — Progress Notes (Signed)
Patient covid test  Came back positive. Called patient and let him know. He said he feels fine and has no symptoms. Instructed patient to call his primary doctor and let him know that he had a positive covid test with no symptoms. Cardioversion today will be cancelled and his cardiology office to be made aware  to be rescheduled.

## 2020-01-21 ENCOUNTER — Other Ambulatory Visit: Payer: Self-pay | Admitting: Internal Medicine

## 2020-01-23 ENCOUNTER — Encounter: Payer: Medicare Other | Admitting: Student

## 2020-01-23 ENCOUNTER — Other Ambulatory Visit: Payer: Self-pay | Admitting: *Deleted

## 2020-01-23 NOTE — Patient Outreach (Signed)
[facility]) Care Management  [place]  01/23/2020   Mark Nguyen Jul 18, 1931 [id number]  RN Health Coach telephone call to patient.  [employer] compliance verified. Per patient he can feel a little irregularity of heart rate and his arm hurts sometimes.Per patient he is taking his medications as prescribed.  Patient had a Cardioversion scheduled and tested positive for COVID.It was cancelled and will have to be rescheduled.  Per patient he did not have any COVID symptoms of COVID. Patient has schedule appointment with Cardiologist on Friday if weather is permitting. Patient had a fall by tripping but no injury.  He is driving and doing his own grocery shopping. Patient loves to cook for self and others. Patient has had his COVID vaccines, booster and flu shot. Patient has agreed to follow up outreach calls.    Encounter Medications:  Outpatient Encounter Medications as of 01/23/2020  Medication Sig  . amiodarone (PACERONE) 200 MG tablet Take 1 tablet (200 mg total) by mouth 2 (two) times daily.  [name] apixaban (ELIQUIS) 5 MG TABS tablet Take 1 tablet (5 mg total) by mouth 2 (two) times daily.  [name] atorvastatin (LIPITOR) 20 MG tablet TAKE 1 TABLET BY MOUTH DAILY AT BEDTIME (Patient taking differently: Take 20 mg by mouth at bedtime.)  . carvedilol (COREG) 25 MG tablet TAKE 1 TABLET TWICE DAILY WITH A MEAL. (Patient taking differently: Take 25 mg by mouth in the morning and at bedtime.)  . Cholecalciferol (VITAMIN D-3) 1000 UNITS CAPS Take 1,000 Units by mouth daily.   . furosemide (LASIX) 40 MG tablet Take 1 tablet (40 mg total) by mouth daily. Hold dosage if weight < 183 pounds  . indomethacin (INDOCIN SR) 75 MG CR capsule Take 75 mg by mouth daily as needed (gout attacks).  . isosorbide mononitrate (IMDUR) 30 MG 24 hr tablet Take 1 tablet (30 mg total) by mouth daily.  [name] losartan (COZAAR) 25 MG tablet Take 0.5 tablets 12.5 mg at Bedtime (Patient taking differently: Take 12.5  mg by mouth at bedtime.)  . omeprazole (PRILOSEC) 20 MG capsule TAKE 1 CAPSULE DAILY  . phenylephrine (SUDAFED PE) 10 MG TABS tablet Take 10 mg by mouth every 6 (six) hours as needed (congestion).  [name] spironolactone (ALDACTONE) 25 MG tablet TAKE 1/2 TABLET BY MOUTH ONCE A DAY. (Patient taking differently: Take 12.5 mg by mouth daily.)  . Tamsulosin HCl (FLOMAX) 0.4 MG CAPS Take 0.4 mg by mouth daily after breakfast.    No facility-administered encounter medications on file as of 01/23/2020.    Functional Status:  In your present state of health, do you have any difficulty performing the following activities: 02/19/2019 01/25/2019  Hearing? N Y  Vision? N -  Difficulty concentrating or making decisions? N -  Walking or climbing stairs? Y -  Dressing or bathing? N N  Doing errands, shopping? N N  Preparing Food and eating ? N N  Using the Toilet? N N  In the past six months, have you accidently leaked urine? N -  Do you have problems with loss of bowel control? N -  Managing your Medications? N N  Managing your Finances? N N  Housekeeping or managing your Housekeeping? N -  Some recent data might be hidden    Fall/Depression Screening: Fall Risk  06/10/2019 02/19/2019 11/28/2018  Falls in the past year? 0 0 0  Number falls in past yr: 0 0 -  Injury with Fall? 0 0 -  Risk for fall  due to : - - Impaired balance/gait  Follow up Falls evaluation completed;Falls prevention discussed Falls evaluation completed;Falls prevention discussed -   PHQ 2/9 Scores 09/10/2019 06/10/2019 03/06/2019 02/19/2019 02/05/2019 01/25/2019 12/07/2018  PHQ - 2 Score 0 0 0 0 0 0 0  PHQ- 9 Score - - - - - - -    Assessment:  Goals Addressed            This Visit's Progress   . (THN)Make and Keep All Appointments   On track    Timeframe:  Long-Range Goal Priority:  High Start Date:     [id number]                        Expected End Date:   [id number]                   Follow Up Date [id number]   - call to cancel if  needed - keep a calendar with prescription refill dates - keep a calendar with appointment dates    Why is this important?   Part of staying healthy is seeing the doctor for follow-up care.  If you forget your appointments, there are some things you can do to stay on track.    Notes:  Patient is planning ahead on renewing application for Eliquis before the expiration date Patient needs to keep all cardiology appointments and ICD checks    . [place] My Quality of Life   On track    Timeframe:  Long-Range Goal Priority:  Medium Start Date: [id number]                            Expected End Date:  [id number]                    Follow Up Date [id number]  - spend time outdoors at least 3 times a week    Why is this important?   Having a long-term illness can be scary.  It can also be stressful for you and your caregiver.  These steps may help.    Notes:  Patient loves mowing and taking care of yard.  He loves to do his own cooking and cooks for family to come and eat dinner Patient had COVID and once symptoms better resumed doing his own grocery shopping    . Track and Manage Heart Rate and Rhythm   On track    Timeframe:  Long-Range Goal Priority:  High Start Date: [id number]                            Expected End Date:    [id number]                  Follow Up Date [id number] - make a plan to eat healthy - take medicine as prescribed    Why is this important?   Atrial fibrillation may have no symptoms. Sometimes the symptoms get worse or happen more often.  It is important to keep track of what your symptoms are and when they happen.  A change in symptoms is important to discuss with your doctor or nurse.  Being active and healthy eating will also help you manage your heart condition.     Notes: Remote checks q 3 months Follow up with All Cardiology appointments       Plan:  Follow-up:  Patient agrees to Care Plan and Follow-up. Provided eduational material on F.A.S.T. acronym  for stroke Patient will notify Dr office if unable to make appointment on Fri due to weather Patient will use COVID precautions RN will follow up outreach within the month of April RN sent update assessment to PCP  [place] [facility] [phone]

## 2020-01-23 NOTE — Patient Instructions (Signed)
Goals Addressed            This Visit's Progress   . (THN)Make and Keep All Appointments   On track    Timeframe:  Long-Range Goal Priority:  High Start Date:     [id number]                        Expected End Date:   [id number]                   Follow Up Date [id number]   - call to cancel if needed - keep a calendar with prescription refill dates - keep a calendar with appointment dates    Why is this important?   Part of staying healthy is seeing the doctor for follow-up care.  If you forget your appointments, there are some things you can do to stay on track.    Notes:  Patient is planning ahead on renewing application for Eliquis before the expiration date Patient needs to keep all cardiology appointments and ICD checks    . [place] My Quality of Life   On track    Timeframe:  Long-Range Goal Priority:  Medium Start Date: [id number]                            Expected End Date:  [id number]                    Follow Up Date [id number]  - spend time outdoors at least 3 times a week    Why is this important?   Having a long-term illness can be scary.  It can also be stressful for you and your caregiver.  These steps may help.    Notes:  Patient loves mowing and taking care of yard.  He loves to do his own cooking and cooks for family to come and eat dinner Patient had COVID and once symptoms better resumed doing his own grocery shopping    . Track and Manage Heart Rate and Rhythm   On track    Timeframe:  Long-Range Goal Priority:  High Start Date: [id number]                            Expected End Date:    [id number]                  Follow Up Date [id number] - make a plan to eat healthy - take medicine as prescribed    Why is this important?   Atrial fibrillation may have no symptoms. Sometimes the symptoms get worse or happen more often.  It is important to keep track of what your symptoms are and when they happen.  A change in symptoms is important to discuss with your  doctor or nurse.  Being active and healthy eating will also help you manage your heart condition.     Notes: Remote checks q 3 months Follow up with All Cardiology appointments

## 2020-01-24 ENCOUNTER — Encounter: Payer: Self-pay | Admitting: Nurse Practitioner

## 2020-01-24 ENCOUNTER — Other Ambulatory Visit: Payer: Self-pay

## 2020-01-24 ENCOUNTER — Ambulatory Visit (INDEPENDENT_AMBULATORY_CARE_PROVIDER_SITE_OTHER): Payer: Medicare Other | Admitting: Nurse Practitioner

## 2020-01-24 VITALS — BP 112/70 | HR 98 | Ht 67.0 in | Wt 190.0 lb

## 2020-01-24 DIAGNOSIS — I4819 Other persistent atrial fibrillation: Secondary | ICD-10-CM

## 2020-01-24 DIAGNOSIS — I5022 Chronic systolic (congestive) heart failure: Secondary | ICD-10-CM

## 2020-01-24 DIAGNOSIS — I472 Ventricular tachycardia, unspecified: Secondary | ICD-10-CM

## 2020-01-24 LAB — BASIC METABOLIC PANEL
BUN/Creatinine Ratio: 14 (ref 10–24)
BUN: 17 mg/dL (ref 8–27)
CO2: 25 mmol/L (ref 20–29)
Calcium: 9.2 mg/dL (ref 8.6–10.2)
Chloride: 101 mmol/L (ref 96–106)
Creatinine, Ser: 1.24 mg/dL (ref 0.76–1.27)
GFR calc Af Amer: 60 mL/min/{1.73_m2} (ref >59)
GFR calc non Af Amer: 52 mL/min/{1.73_m2} — ABNORMAL LOW (ref >59)
Glucose: 118 mg/dL — ABNORMAL HIGH (ref 65–99)
Potassium: 3.8 mmol/L (ref 3.5–5.2)
Sodium: 141 mmol/L (ref 134–144)

## 2020-01-24 LAB — CBC WITH DIFFERENTIAL/PLATELET
Basophils Absolute: 0.1 10*3/uL (ref 0.0–0.2)
Basos: 1 %
EOS (ABSOLUTE): 0.1 10*3/uL (ref 0.0–0.4)
Eos: 1 %
Hematocrit: 37.8 % (ref 37.5–51.0)
Hemoglobin: 11.8 g/dL — ABNORMAL LOW (ref 13.0–17.7)
Immature Grans (Abs): 0 10*3/uL (ref 0.0–0.1)
Immature Granulocytes: 0 %
Lymphocytes Absolute: 1.3 10*3/uL (ref 0.7–3.1)
Lymphs: 16 %
MCH: 27.3 pg (ref 26.6–33.0)
MCHC: 31.2 g/dL — ABNORMAL LOW (ref 31.5–35.7)
MCV: 87 fL (ref 79–97)
Monocytes Absolute: 0.6 10*3/uL (ref 0.1–0.9)
Monocytes: 7 %
Neutrophils Absolute: 6 10*3/uL (ref 1.4–7.0)
Neutrophils: 75 %
Platelets: 206 10*3/uL (ref 150–450)
RBC: 4.33 x10E6/uL (ref 4.14–5.80)
RDW: 14.5 % (ref 11.6–15.4)
WBC: 8.2 10*3/uL (ref 3.4–10.8)

## 2020-01-24 NOTE — Patient Instructions (Signed)
Medication Instructions:  *If you need a refill on your cardiac medications before your next appointment, please call your pharmacy*  Lab Work: Your physician recommends that you have for lab work: BMET and CBC If you have labs (blood work) drawn today and your tests are completely normal, you will receive your results only by: [name] MyChart Message (if you have MyChart) OR . A paper copy in the mail If you have any lab test that is abnormal or we need to change your treatment, we will call you to review the results.  Testing/Procedures: Your physician has recommended that you have a Cardioversion (DCCV). Electrical Cardioversion uses a jolt of electricity to your heart either through paddles or wired patches attached to your chest. This is a controlled, usually prescheduled, procedure. Defibrillation is done under light anesthesia in the hospital, and you usually go home the day of the procedure. This is done to get your heart back into a normal rhythm. You are not awake for the procedure. Please see the instruction sheet given to you today.  Follow-Up: At [facility], you and your health needs are our priority.  As part of our continuing mission to provide you with exceptional heart care, we have created designated Provider Care Teams.  These Care Teams include your primary Cardiologist (physician) and Advanced Practice Providers (APPs -  Physician Assistants and Nurse Practitioners) who all work together to provide you with the care you need, when you need it.  We recommend signing up for the patient portal called "MyChart".  Sign up information is provided on this After Visit Summary.  MyChart is used to connect with patients for Virtual Visits (Telemedicine).  Patients are able to view lab/test results, encounter notes, upcoming appointments, etc.  Non-urgent messages can be sent to your provider as well.   To learn more about what you can do with MyChart, go to [email or web address].     Your next appointment:   Your physician recommends that you schedule a follow-up appointment in: 4 WEEKS from 02/14/20 with Dr. Lovena Le or an EP APP for Cardioversion follow up  The format for your next appointment:   In Person.You may see Cristopher Peru, MD or one of the following Advanced Practice Providers on your designated Care Team:    Chanetta Marshall, NP  Tommye Standard, [place]  Legrand Como "[place]ni [place]" [place], [place]    Your provider has recommended a cardioversion.   You are scheduled for a cardioversion on Friday 02/14/20 at 9:30 am with Dr. Sallyanne Kuster. Please go to [facility] 2nd [place] Stay at 7:30 am.  Enter through the [place] not have any food or drink after midnight on Thursday 02/13/20.  You may take your medicines EXCEPT FUROSEMIDE (LASIX) with a sip of water on the day of your procedure.  You will need someone to drive you home following your procedure.   Call the [place] office at [phone] if you have any questions, problems or concerns.     Electrical Cardioversion Electrical cardioversion is the delivery of a jolt of electricity to change the rhythm of the heart. Sticky patches or metal paddles are placed on the chest to deliver the electricity from a device. This is done to restore a normal rhythm. A rhythm that is too fast or not regular keeps the heart from pumping well. Electrical cardioversion is done in an emergency if:   There is low or no blood pressure as a result of the heart  rhythm.   Normal rhythm must be restored as fast as possible to protect the brain and heart from further damage.   It may save a life. Cardioversion may be done for heart rhythms that are not immediately life threatening, such as atrial fibrillation or flutter, in which:   The heart is beating too fast or is not regular.   Medicine to change the rhythm has not worked.   It is safe to wait in order to allow time for  preparation.  Symptoms of the abnormal rhythm are bothersome.  The risk of stroke and other serious problems can be reduced.  LET [facility] CARE PROVIDER KNOW ABOUT:   Any allergies you have.  All medicines you are taking, including vitamins, herbs, eye drops, creams, and over-the-counter medicines.  Previous problems you or members of your family have had with the use of anesthetics.   Any blood disorders you have.   Previous surgeries you have had.   Medical conditions you have.   RISKS AND COMPLICATIONS  Generally, this is a safe procedure. However, problems can occur and include:   Breathing problems related to the anesthetic used.  A blood clot that breaks free and travels to other parts of your body. This could cause a stroke or other problems. The risk of this is lowered by use of blood-thinning medicine (anticoagulant) prior to the procedure.  Cardiac arrest (rare).   BEFORE THE PROCEDURE   You may have tests to detect blood clots in your heart and to evaluate heart function.  You may start taking anticoagulants so your blood does not clot as easily.   Medicines may be given to help stabilize your heart rate and rhythm.   PROCEDURE  You will be given medicine through an IV tube to reduce discomfort and make you sleepy (sedative).   An electrical shock will be delivered.   AFTER THE PROCEDURE Your heart rhythm will be watched to make sure it does not change. You will need someone to drive you home

## 2020-01-24 NOTE — Progress Notes (Signed)
Electrophysiology Office Note Date: 01/24/2020  ID:  Mark Nguyen, DOB [birth year], MRN [id number]  PCP: Lacinda Axon, MD Electrophysiologist: Lovena Le  CC: Routine AF follow-up  Mark Nguyen is a 87 y.o. male seen today for Dr Lovena Le.  He presents today for routine electrophysiology followup.  Since last being seen in our clinic, the patient reports doing reasonably well.  He was seen by Joseph Art in December and set up for DCCV. His COVID test came back positive (was asymptomatic) and DCCV was cancelled.  He denies chest pain, palpitations, dyspnea, PND, orthopnea, nausea, vomiting, dizziness, syncope, edema, weight gain, or early satiety.  He has not had ICD shocks.   Device History: St. JudeBiVICD implanted 2003 -> Biv upgrade 2011, gen change 5/2041for CHF History of appropriate therapy:Yes History of AAD therapy:Yes; currently onamiodarone  Past Medical History:  Diagnosis Date  . CAD (coronary artery disease)   . CHF (congestive heart failure) ([place])   . Dyslipidemia   . History of ventricular fibrillation   . HTN (hypertension)   . Hx-sudden cardiac arrest   . Ischemic cardiomyopathy   . LBBB (left bundle branch block)   . Ventricular tachycardia [facility])    Past Surgical History:  Procedure Laterality Date  . CARDIAC CATHETERIZATION  10/12/2008  . CARDIAC DEFIBRILLATOR PLACEMENT     St Jude  . CARDIOVERSION N/A 06/28/2019   Procedure: CARDIOVERSION;  Surgeon: Lelon Perla, MD;  Location: [facility] ENDOSCOPY;  Service: Cardiovascular;  Laterality: N/A;  . DOPPLER ECHOCARDIOGRAPHY  2008, 2011  . EP IMPLANTABLE DEVICE N/A 05/19/2014   Procedure: ICD/BIV ICD Generator Changeout;  Surgeon: Evans Lance, MD;  Location: [place] CV LAB;  Service: Cardiovascular;  Laterality: N/A;  . TONSILECTOMY, ADENOIDECTOMY, BILATERAL MYRINGOTOMY AND TUBES      Current Outpatient Medications  Medication Sig Dispense Refill  . amiodarone (PACERONE) 200 MG tablet Take 1  tablet (200 mg total) by mouth 2 (two) times daily. 180 tablet 3  . apixaban (ELIQUIS) 5 MG TABS tablet Take 1 tablet (5 mg total) by mouth 2 (two) times daily. 60 tablet 11  . atorvastatin (LIPITOR) 20 MG tablet TAKE 1 TABLET BY MOUTH DAILY AT BEDTIME (Patient taking differently: Take 20 mg by mouth at bedtime.) 90 tablet 3  . carvedilol (COREG) 25 MG tablet TAKE 1 TABLET TWICE DAILY WITH A MEAL. (Patient taking differently: Take 25 mg by mouth in the morning and at bedtime.) 180 tablet 3  . Cholecalciferol (VITAMIN D-3) 1000 UNITS CAPS Take 1,000 Units by mouth daily.     . furosemide (LASIX) 40 MG tablet Take 1 tablet (40 mg total) by mouth daily. Hold dosage if weight < 183 pounds 90 tablet 3  . indomethacin (INDOCIN SR) 75 MG CR capsule Take 75 mg by mouth daily as needed (gout attacks).    . isosorbide mononitrate (IMDUR) 30 MG 24 hr tablet Take 1 tablet (30 mg total) by mouth daily. 90 tablet 3  . losartan (COZAAR) 25 MG tablet Take 0.5 tablets 12.5 mg at Bedtime (Patient taking differently: Take 12.5 mg by mouth at bedtime.) 90 tablet 3  . omeprazole (PRILOSEC) 20 MG capsule TAKE 1 CAPSULE DAILY 90 capsule 2  . phenylephrine (SUDAFED PE) 10 MG TABS tablet Take 10 mg by mouth every 6 (six) hours as needed (congestion).    [name] spironolactone (ALDACTONE) 25 MG tablet TAKE 1/2 TABLET BY MOUTH ONCE A DAY. (Patient taking differently: Take 12.5 mg by mouth daily.) 45 tablet 2  .  Tamsulosin HCl (FLOMAX) 0.4 MG CAPS Take 0.4 mg by mouth daily after breakfast.      No current facility-administered medications for this visit.    Allergies:   Patient has no known allergies.   Social History: Social History   Socioeconomic History  . Marital status: Divorced    Spouse name: Not on file  . Number of children: Not on file  . Years of education: Not on file  . Highest education level: Not on file  Occupational History  . Occupation: Web designer  Tobacco Use  . Smoking status: Former  Smoker    Quit date: 01/03/1985    Years since quitting: 35.0  . Smokeless tobacco: Never Used  Vaping Use  . Vaping Use: Never used  Substance and Sexual Activity  . Alcohol use: Never  . Drug use: Never  . Sexual activity: Not on file  Other Topics Concern  . Not on file  Social History Narrative  . Not on file   Social Determinants of Health   Financial Resource Strain: Not on file  Food Insecurity: No Food Insecurity  . Worried About Charity fundraiser in the Last Year: Never true  . Ran Out of Food in the Last Year: Never true  Transportation Needs: No Transportation Needs  . Lack of Transportation (Medical): No  . Lack of Transportation (Non-Medical): No  Physical Activity: Not on file  Stress: Not on file  Social Connections: Not on file  Intimate Partner Violence: Not on file    Family History: Family History  Problem Relation Age of Onset  . Coronary artery disease Other     Review of Systems: All other systems reviewed and are otherwise negative except as noted above.   Physical Exam: VS:  BP 112/70   Pulse 98   Ht 5\' 7"  (1.702 m)   Wt 190 lb (86.2 kg)   SpO2 98%   BMI 29.76 kg/m  , BMI Body mass index is 29.76 kg/m.  GEN- The patient is well appearing, alert and oriented x 3 today.   HEENT: normocephalic, atraumatic; sclera clear, conjunctiva pink; hearing intact; oropharynx clear; neck supple  Lungs- Clear to ausculation bilaterally, normal work of breathing.  No wheezes, rales, rhonchi Heart- irregular rate and rhythm  GI- soft, non-tender, non-distended, bowel sounds present Extremities- no clubbing, cyanosis, or edema  MS- no significant deformity or atrophy Skin- warm and dry, no rash or lesion; ICD pocket well healed Psych- euthymic mood, full affect Neuro- strength and sensation are intact  ICD interrogation- reviewed in detail today,  See PACEART report  EKG:  EKG is not ordered today.  Recent Labs: 03/18/2019: ALT 25; TSH  2.960 06/18/2019: Magnesium 2.1 01/02/2020: BUN 18; Creatinine, Ser 1.43; Hemoglobin 11.7; Platelets 165; Potassium 4.1; Sodium 142   Wt Readings from Last 3 Encounters:  01/24/20 190 lb (86.2 kg)  01/02/20 197 lb (89.4 kg)  10/04/19 192 lb (87.1 kg)     Other studies Reviewed: Additional studies/ records that were reviewed today include: Renee's notes  Assessment and Plan:  1.  Chronic systolic dysfunction euvolemic today Stable on an appropriate medical regimen Normal ICD function See Pace Art report No changes today  2.  Persistent atrial fibrillation Remains persistent by device interrogation today Continue Eliquis for CHADS2VASC of 5 COVID test was positive on 01/13/20 - will reschedule DCCV for 30 days post positive COVID test per cone  3.  VT No recent recurrence    Current medicines  are reviewed at length with the patient today.   The patient does not have concerns regarding his medicines.  The following changes were made today:  none  Labs/ tests ordered today include: none Orders Placed This Encounter  Procedures  . Basic Metabolic Panel (BMET)  . CBC w/Diff     Disposition:   Follow up with Dr Lovena Le post DCCV     Signed, Chanetta Marshall, NP 01/24/2020 11:05 AM  [place] [address] [phone] (office) [phone] (fax)

## 2020-01-24 NOTE — H&P (View-Only) (Signed)
Electrophysiology Office Note Date: 01/24/2020  ID:  DEMETRICK EICHENBERGER, DOB [birth year], MRN [id number]  PCP: Lacinda Axon, MD Electrophysiologist: Lovena Le  CC: Routine AF follow-up  Mark Nguyen is a 87 y.o. male seen today for Dr Lovena Le.  He presents today for routine electrophysiology followup.  Since last being seen in our clinic, the patient reports doing reasonably well.  He was seen by Joseph Art in December and set up for DCCV. His COVID test came back positive (was asymptomatic) and DCCV was cancelled.  He denies chest pain, palpitations, dyspnea, PND, orthopnea, nausea, vomiting, dizziness, syncope, edema, weight gain, or early satiety.  He has not had ICD shocks.   Device History: St. JudeBiVICD implanted 2003 -> Biv upgrade 2011, gen change 5/2041for CHF History of appropriate therapy:Yes History of AAD therapy:Yes; currently onamiodarone  Past Medical History:  Diagnosis Date  . CAD (coronary artery disease)   . CHF (congestive heart failure) ([place])   . Dyslipidemia   . History of ventricular fibrillation   . HTN (hypertension)   . Hx-sudden cardiac arrest   . Ischemic cardiomyopathy   . LBBB (left bundle branch block)   . Ventricular tachycardia [facility])    Past Surgical History:  Procedure Laterality Date  . CARDIAC CATHETERIZATION  10/12/2008  . CARDIAC DEFIBRILLATOR PLACEMENT     St Jude  . CARDIOVERSION N/A 06/28/2019   Procedure: CARDIOVERSION;  Surgeon: Lelon Perla, MD;  Location: [facility] ENDOSCOPY;  Service: Cardiovascular;  Laterality: N/A;  . DOPPLER ECHOCARDIOGRAPHY  2008, 2011  . EP IMPLANTABLE DEVICE N/A 05/19/2014   Procedure: ICD/BIV ICD Generator Changeout;  Surgeon: Evans Lance, MD;  Location: [place] CV LAB;  Service: Cardiovascular;  Laterality: N/A;  . TONSILECTOMY, ADENOIDECTOMY, BILATERAL MYRINGOTOMY AND TUBES      Current Outpatient Medications  Medication Sig Dispense Refill  . amiodarone (PACERONE) 200 MG tablet Take 1  tablet (200 mg total) by mouth 2 (two) times daily. 180 tablet 3  . apixaban (ELIQUIS) 5 MG TABS tablet Take 1 tablet (5 mg total) by mouth 2 (two) times daily. 60 tablet 11  . atorvastatin (LIPITOR) 20 MG tablet TAKE 1 TABLET BY MOUTH DAILY AT BEDTIME (Patient taking differently: Take 20 mg by mouth at bedtime.) 90 tablet 3  . carvedilol (COREG) 25 MG tablet TAKE 1 TABLET TWICE DAILY WITH A MEAL. (Patient taking differently: Take 25 mg by mouth in the morning and at bedtime.) 180 tablet 3  . Cholecalciferol (VITAMIN D-3) 1000 UNITS CAPS Take 1,000 Units by mouth daily.     . furosemide (LASIX) 40 MG tablet Take 1 tablet (40 mg total) by mouth daily. Hold dosage if weight < 183 pounds 90 tablet 3  . indomethacin (INDOCIN SR) 75 MG CR capsule Take 75 mg by mouth daily as needed (gout attacks).    . isosorbide mononitrate (IMDUR) 30 MG 24 hr tablet Take 1 tablet (30 mg total) by mouth daily. 90 tablet 3  . losartan (COZAAR) 25 MG tablet Take 0.5 tablets 12.5 mg at Bedtime (Patient taking differently: Take 12.5 mg by mouth at bedtime.) 90 tablet 3  . omeprazole (PRILOSEC) 20 MG capsule TAKE 1 CAPSULE DAILY 90 capsule 2  . phenylephrine (SUDAFED PE) 10 MG TABS tablet Take 10 mg by mouth every 6 (six) hours as needed (congestion).    [name] spironolactone (ALDACTONE) 25 MG tablet TAKE 1/2 TABLET BY MOUTH ONCE A DAY. (Patient taking differently: Take 12.5 mg by mouth daily.) 45 tablet 2  .  Tamsulosin HCl (FLOMAX) 0.4 MG CAPS Take 0.4 mg by mouth daily after breakfast.      No current facility-administered medications for this visit.    Allergies:   Patient has no known allergies.   Social History: Social History   Socioeconomic History  . Marital status: Divorced    Spouse name: Not on file  . Number of children: Not on file  . Years of education: Not on file  . Highest education level: Not on file  Occupational History  . Occupation: Web designer  Tobacco Use  . Smoking status: Former  Smoker    Quit date: 01/03/1985    Years since quitting: 35.0  . Smokeless tobacco: Never Used  Vaping Use  . Vaping Use: Never used  Substance and Sexual Activity  . Alcohol use: Never  . Drug use: Never  . Sexual activity: Not on file  Other Topics Concern  . Not on file  Social History Narrative  . Not on file   Social Determinants of Health   Financial Resource Strain: Not on file  Food Insecurity: No Food Insecurity  . Worried About Charity fundraiser in the Last Year: Never true  . Ran Out of Food in the Last Year: Never true  Transportation Needs: No Transportation Needs  . Lack of Transportation (Medical): No  . Lack of Transportation (Non-Medical): No  Physical Activity: Not on file  Stress: Not on file  Social Connections: Not on file  Intimate Partner Violence: Not on file    Family History: Family History  Problem Relation Age of Onset  . Coronary artery disease Other     Review of Systems: All other systems reviewed and are otherwise negative except as noted above.   Physical Exam: VS:  BP 112/70   Pulse 98   Ht 5\' 7"  (1.702 m)   Wt 190 lb (86.2 kg)   SpO2 98%   BMI 29.76 kg/m  , BMI Body mass index is 29.76 kg/m.  GEN- The patient is well appearing, alert and oriented x 3 today.   HEENT: normocephalic, atraumatic; sclera clear, conjunctiva pink; hearing intact; oropharynx clear; neck supple  Lungs- Clear to ausculation bilaterally, normal work of breathing.  No wheezes, rales, rhonchi Heart- irregular rate and rhythm  GI- soft, non-tender, non-distended, bowel sounds present Extremities- no clubbing, cyanosis, or edema  MS- no significant deformity or atrophy Skin- warm and dry, no rash or lesion; ICD pocket well healed Psych- euthymic mood, full affect Neuro- strength and sensation are intact  ICD interrogation- reviewed in detail today,  See PACEART report  EKG:  EKG is not ordered today.  Recent Labs: 03/18/2019: ALT 25; TSH  2.960 06/18/2019: Magnesium 2.1 01/02/2020: BUN 18; Creatinine, Ser 1.43; Hemoglobin 11.7; Platelets 165; Potassium 4.1; Sodium 142   Wt Readings from Last 3 Encounters:  01/24/20 190 lb (86.2 kg)  01/02/20 197 lb (89.4 kg)  10/04/19 192 lb (87.1 kg)     Other studies Reviewed: Additional studies/ records that were reviewed today include: Renee's notes  Assessment and Plan:  1.  Chronic systolic dysfunction euvolemic today Stable on an appropriate medical regimen Normal ICD function See Pace Art report No changes today  2.  Persistent atrial fibrillation Remains persistent by device interrogation today Continue Eliquis for CHADS2VASC of 5 COVID test was positive on 01/13/20 - will reschedule DCCV for 30 days post positive COVID test per cone  3.  VT No recent recurrence    Current medicines  are reviewed at length with the patient today.   The patient does not have concerns regarding his medicines.  The following changes were made today:  none  Labs/ tests ordered today include: none Orders Placed This Encounter  Procedures  . Basic Metabolic Panel (BMET)  . CBC w/Diff     Disposition:   Follow up with Dr Lovena Le post DCCV     Signed, Chanetta Marshall, NP 01/24/2020 11:05 AM  [place] [address] [phone] (office) [phone] (fax)

## 2020-02-04 ENCOUNTER — Other Ambulatory Visit: Payer: Self-pay | Admitting: Internal Medicine

## 2020-02-04 DIAGNOSIS — I5022 Chronic systolic (congestive) heart failure: Secondary | ICD-10-CM

## 2020-02-05 ENCOUNTER — Telehealth: Payer: Self-pay

## 2020-02-05 NOTE — Telephone Encounter (Signed)
-----   Message from Patsey Berthold, NP sent at 01/25/2020  7:07 AM EST ----- Please notify patient of stable labs. Thanks!

## 2020-02-05 NOTE — Telephone Encounter (Signed)
Pt is aware and agreeable to stable lab results Reminded patient of scheduled DCCV on 2/11 Pt is aware and agreeable

## 2020-02-14 ENCOUNTER — Ambulatory Visit (HOSPITAL_COMMUNITY): Payer: Medicare Other | Admitting: Anesthesiology

## 2020-02-14 ENCOUNTER — Ambulatory Visit (HOSPITAL_COMMUNITY)
Admission: RE | Admit: 2020-02-14 | Discharge: 2020-02-14 | Disposition: A | Discharge status: Home / Self Care | Payer: Medicare Other | Attending: Cardiovascular Disease | Admitting: Cardiovascular Disease

## 2020-02-14 ENCOUNTER — Other Ambulatory Visit: Payer: Self-pay

## 2020-02-14 ENCOUNTER — Encounter (HOSPITAL_COMMUNITY)
Admission: RE | Disposition: A | Discharge status: Home / Self Care | Payer: Self-pay | Source: Home / Self Care | Attending: Cardiovascular Disease

## 2020-02-14 DIAGNOSIS — Z95810 Presence of automatic (implantable) cardiac defibrillator: Secondary | ICD-10-CM | POA: Insufficient documentation

## 2020-02-14 DIAGNOSIS — I4891 Unspecified atrial fibrillation: Secondary | ICD-10-CM | POA: Diagnosis not present

## 2020-02-14 DIAGNOSIS — Z7901 Long term (current) use of anticoagulants: Secondary | ICD-10-CM | POA: Insufficient documentation

## 2020-02-14 DIAGNOSIS — Z87891 Personal history of nicotine dependence: Secondary | ICD-10-CM | POA: Insufficient documentation

## 2020-02-14 DIAGNOSIS — Z79899 Other long term (current) drug therapy: Secondary | ICD-10-CM | POA: Insufficient documentation

## 2020-02-14 DIAGNOSIS — Z8616 Personal history of COVID-19: Secondary | ICD-10-CM | POA: Diagnosis not present

## 2020-02-14 DIAGNOSIS — I5042 Chronic combined systolic (congestive) and diastolic (congestive) heart failure: Secondary | ICD-10-CM | POA: Diagnosis not present

## 2020-02-14 DIAGNOSIS — I447 Left bundle-branch block, unspecified: Secondary | ICD-10-CM | POA: Diagnosis not present

## 2020-02-14 DIAGNOSIS — I110 Hypertensive heart disease with heart failure: Secondary | ICD-10-CM | POA: Diagnosis not present

## 2020-02-14 DIAGNOSIS — I4819 Other persistent atrial fibrillation: Secondary | ICD-10-CM

## 2020-02-14 DIAGNOSIS — Z0189 Encounter for other specified special examinations: Secondary | ICD-10-CM

## 2020-02-14 HISTORY — PX: CARDIOVERSION: SHX1299

## 2020-02-14 LAB — POCT I-STAT, CHEM 8
BUN: 27 mg/dL — ABNORMAL HIGH (ref 8–23)
Calcium, Ion: 1.19 mmol/L (ref 1.15–1.40)
Chloride: 102 mmol/L (ref 98–111)
Creatinine, Ser: 1.3 mg/dL — ABNORMAL HIGH (ref 0.61–1.24)
Glucose, Bld: 148 mg/dL — ABNORMAL HIGH (ref 70–99)
HCT: 35 % — ABNORMAL LOW (ref 39.0–52.0)
Hemoglobin: 11.9 g/dL — ABNORMAL LOW (ref 13.0–17.0)
Potassium: 4.1 mmol/L (ref 3.5–5.1)
Sodium: 139 mmol/L (ref 135–145)
TCO2: 26 mmol/L (ref 22–32)

## 2020-02-14 SURGERY — CARDIOVERSION
Anesthesia: General

## 2020-02-14 MED ORDER — PHENYLEPHRINE HCL (PRESSORS) 10 MG/ML IV SOLN
INTRAVENOUS | Status: DC | PRN
  Administered 2020-02-14: 80 ug via INTRAVENOUS

## 2020-02-14 MED ORDER — PROPOFOL 10 MG/ML IV BOLUS
INTRAVENOUS | Status: DC | PRN
  Administered 2020-02-14: 70 mg via INTRAVENOUS

## 2020-02-14 MED ORDER — SODIUM CHLORIDE 0.9 % IV SOLN: Freq: Once | INTRAVENOUS | Status: AC

## 2020-02-14 MED ORDER — LIDOCAINE HCL (CARDIAC) PF 100 MG/5ML IV SOSY
PREFILLED_SYRINGE | INTRAVENOUS | Status: DC | PRN
  Administered 2020-02-14: 40 mg via INTRATRACHEAL

## 2020-02-14 MED ORDER — SODIUM CHLORIDE 0.9 % IV SOLN: INTRAVENOUS | Status: DC | PRN

## 2020-02-14 NOTE — Interval H&P Note (Signed)
History and Physical Interval Note:  02/14/2020 8:18 AM  Mark Nguyen  has presented today for surgery, with the diagnosis of A-FIB.  The various methods of treatment have been discussed with the patient and family. After consideration of risks, benefits and other options for treatment, the patient has consented to  Procedure(s): CARDIOVERSION (N/A) as a surgical intervention.  The patient's history has been reviewed, patient examined, no change in status, stable for surgery.  I have reviewed the patient's chart and labs.  Questions were answered to the patient's satisfaction.     Samon Dishner

## 2020-02-14 NOTE — Op Note (Signed)
Procedure: Electrical Cardioversion Indications:  Atrial Fibrillation  Procedure Details:  Consent: Risks of procedure as well as the alternatives and risks of each were explained to the (patient/caregiver).  Consent for procedure obtained.  Time Out: Verified patient identification, verified procedure, site/side was marked, verified correct patient position, special equipment/implants available, medications/allergies/relevent history reviewed, required imaging and test results available.  Performed  Patient placed on cardiac monitor, pulse oximetry, supplemental oxygen as necessary.  Sedation given: propofol 70 mg IV (Dr. Linna Caprice) Pacer pads placed anterior and posterior chest.  Cardioverted 1 time(s).  Cardioversion with synchronized biphasic 120J shock.  Evaluation: Findings: Post procedure EKG shows: Atrial paced BiV paced Complications: None Patient did tolerate procedure well.   CRT-D device checked before and after CV and all parameters unchanged from baseline.  Time Spent Directly with the Patient:  30 minutes   Mark Nguyen 02/14/2020, 9:48 AM

## 2020-02-14 NOTE — Transfer of Care (Signed)
Immediate Anesthesia Transfer of Care Note  Patient: Mark Nguyen  Procedure(s) Performed: CARDIOVERSION (N/A )  Patient Location: Endoscopy Unit  Anesthesia Type:General  Level of Consciousness: drowsy and patient cooperative  Airway & Oxygen Therapy: Patient Spontanous Breathing and Patient connected to face mask oxygen  Post-op Assessment: Report given to RN and Post -op Vital signs reviewed and stable  Post vital signs: Reviewed and stable  Last Vitals:  Vitals Value Taken Time  BP 96/57   Temp    Pulse 64   Resp 15   SpO2 98     Last Pain:  Vitals:   02/14/20 0821  TempSrc: Oral  PainSc: 0-No pain         Complications: No complications documented.

## 2020-02-14 NOTE — Anesthesia Postprocedure Evaluation (Signed)
Anesthesia Post Note  Patient: Mark Nguyen  Procedure(s) Performed: CARDIOVERSION (N/A )     Patient location during evaluation: Endoscopy Anesthesia Type: General Level of consciousness: awake and alert Pain management: pain level controlled Vital Signs Assessment: post-procedure vital signs reviewed and stable Respiratory status: spontaneous breathing, nonlabored ventilation, respiratory function stable and patient connected to nasal cannula oxygen Cardiovascular status: blood pressure returned to baseline and stable Postop Assessment: no apparent nausea or vomiting Anesthetic complications: no   No complications documented.  Last Vitals:  Vitals:   02/14/20 0947 02/14/20 0950  BP: (!) 96/57 103/63  Pulse: 60 61  Resp: 13 14  Temp: 36.4 C   SpO2:  100%    Last Pain:  Vitals:   02/14/20 0947  TempSrc: Axillary  PainSc: 0-No pain                 Jakhiya Brower COKER

## 2020-02-14 NOTE — Anesthesia Preprocedure Evaluation (Addendum)
Anesthesia Evaluation  Patient identified by MRN, date of birth, ID band Patient awake    Reviewed: Allergy & Precautions, NPO status , Patient's Chart, lab work & pertinent test results  Airway Mallampati: II       Dental   Pulmonary former smoker,    breath sounds clear to auscultation       Cardiovascular hypertension,  Rhythm:Irregular Rate:Normal     Neuro/Psych    GI/Hepatic   Endo/Other    Renal/GU      Musculoskeletal   Abdominal   Peds  Hematology   Anesthesia Other Findings   Reproductive/Obstetrics                            Anesthesia Physical Anesthesia Plan  ASA: III  Anesthesia Plan: General   Post-op Pain Management:    Induction: Intravenous  PONV Risk Score and Plan:   Airway Management Planned: Mask  Additional Equipment:   Intra-op Plan:   Post-operative Plan:   Informed Consent: I have reviewed the patients History and Physical, chart, labs and discussed the procedure including the risks, benefits and alternatives for the proposed anesthesia with the patient or authorized representative who has indicated his/her understanding and acceptance.       Plan Discussed with: CRNA and Anesthesiologist  Anesthesia Plan Comments:         Anesthesia Quick Evaluation

## 2020-02-14 NOTE — Discharge Instructions (Signed)

## 2020-02-14 NOTE — Anesthesia Procedure Notes (Signed)
Procedure Name: General with mask airway Date/Time: 02/14/2020 9:32 AM Performed by: Kathryne Hitch, CRNA Pre-anesthesia Checklist: Emergency Drugs available, Patient identified, Suction available and Patient being monitored Patient Re-evaluated:Patient Re-evaluated prior to induction Oxygen Delivery Method: Ambu bag Preoxygenation: Pre-oxygenation with 100% oxygen Induction Type: IV induction Ventilation: Mask ventilation without difficulty Placement Confirmation: positive ETCO2 Dental Injury: Teeth and Oropharynx as per pre-operative assessment

## 2020-02-16 ENCOUNTER — Encounter (HOSPITAL_COMMUNITY): Payer: Self-pay | Admitting: Cardiovascular Disease

## 2020-02-16 IMAGING — DX DG CHEST 1V PORT
1 series · 1 of 1 positions shown · non-contrast
Comparison: Two-view chest x-ray 05/30/2014

CLINICAL DATA: Shortness of breath. Progressive symptoms over last
3 4 days.

EXAM:
PORTABLE CHEST 1 VIEW

[chest]
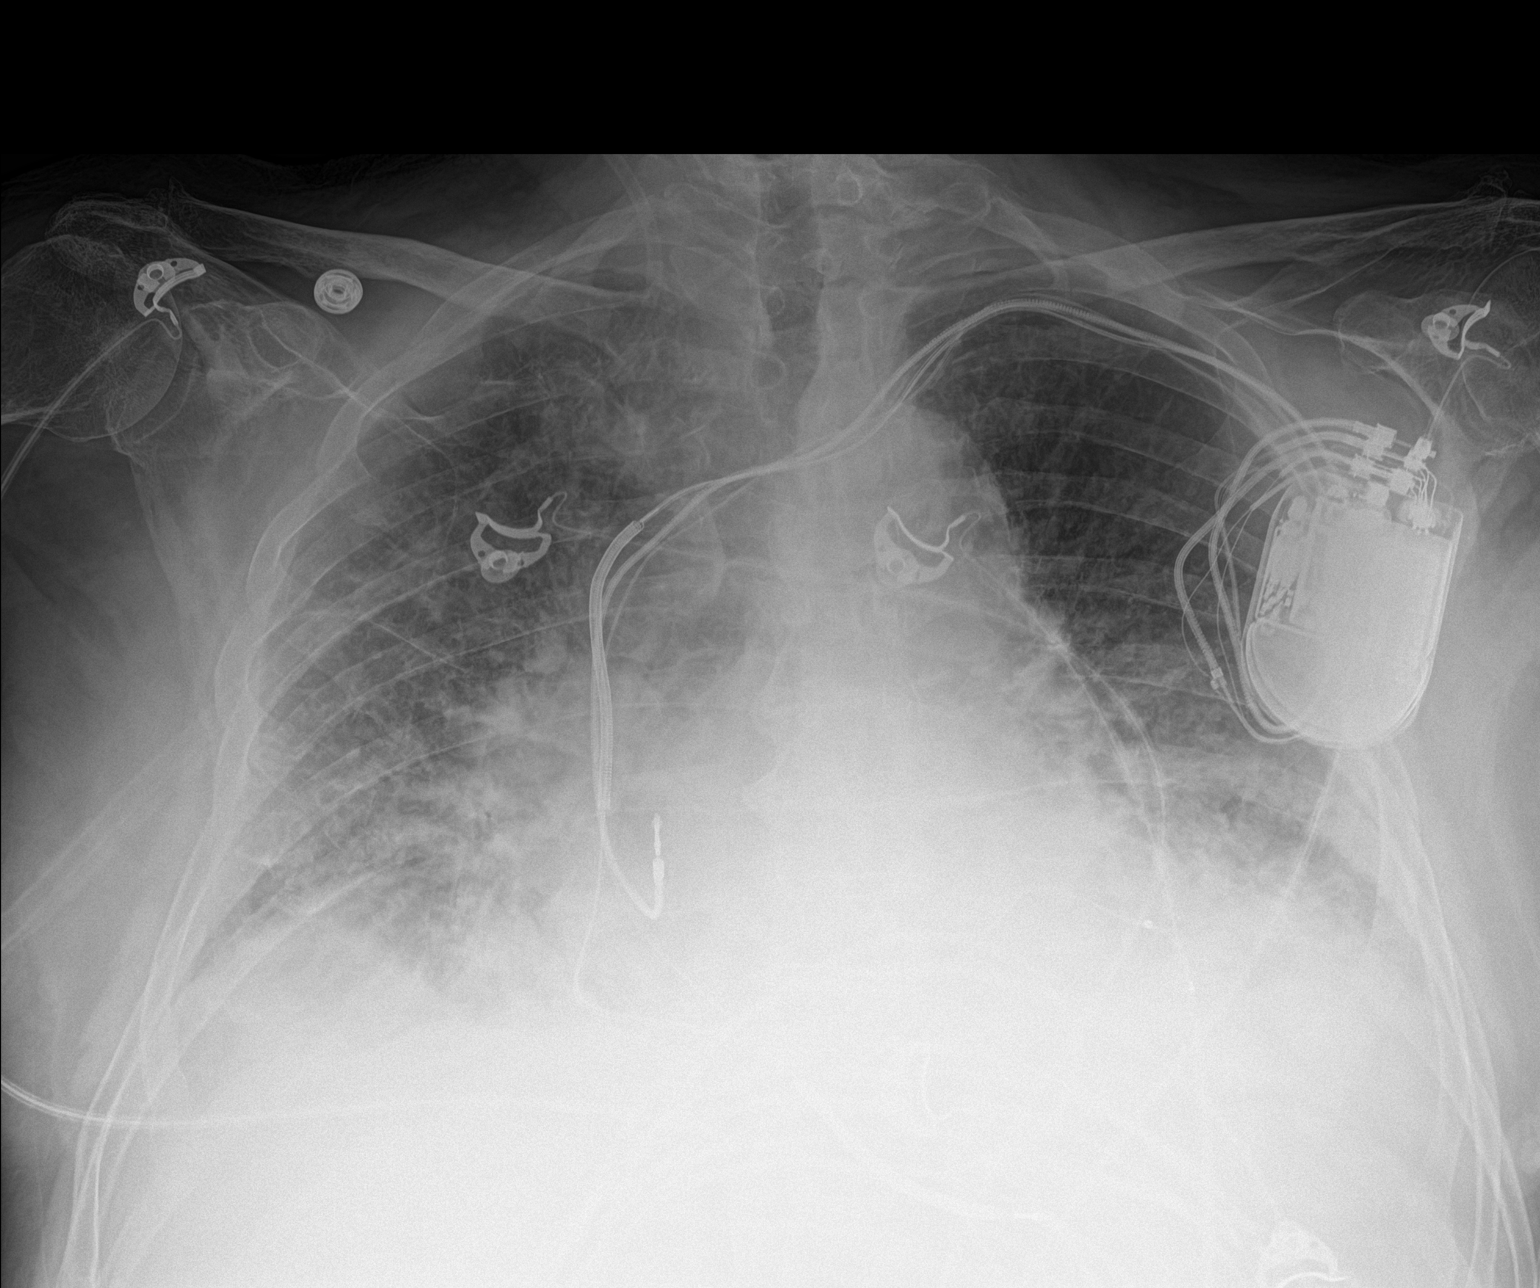

[1 of 1 positions shown; findings below may reference images not displayed]

FINDINGS: The heart is enlarged. There is diffuse interstitial edema.
Bilateral pleural effusions and basilar airspace disease is present.
Pacing wires are stable.
IMPRESSION: 1. Cardiomegaly with new diffuse interstitial edema and bilateral
effusions. Congestive heart failure.
2. Bibasilar airspace disease likely reflects atelectasis.

## 2020-02-18 NOTE — Progress Notes (Signed)
Received notification from [place] ([place]) regarding patient assistance DENIAL for [employer]. Out of pocket expense of 3% of gross income not met. Pt should have rec'd a letter from company as well. (Dated from 02/12/20)

## 2020-02-24 ENCOUNTER — Other Ambulatory Visit: Payer: Self-pay | Admitting: *Deleted

## 2020-02-24 NOTE — Patient Outreach (Signed)
[facility]) Care Management  02/24/2020  Mark Nguyen May 17, 1931 [id number]   RN Health Coach telephone received  Call from patient.  [employer] compliance verified. Per patient he was denied the Eliquis assistance because he had not met his 3%  Out of pocket.  Per patient he will be meeting it next month and needed another form. Patient does not have a computer and didn't know how to get one. He had become very frustrated. RN printed a copy and mailed to patient.   [facility] Management [phone]

## 2020-03-10 ENCOUNTER — Ambulatory Visit (INDEPENDENT_AMBULATORY_CARE_PROVIDER_SITE_OTHER): Payer: Medicare Other

## 2020-03-10 DIAGNOSIS — I472 Ventricular tachycardia, unspecified: Secondary | ICD-10-CM

## 2020-03-10 LAB — CUP PACEART REMOTE DEVICE CHECK
Battery Remaining Longevity: 12 mo
Battery Remaining Percentage: 20 %
Battery Voltage: 2.77 V
Brady Statistic AP VP Percent: 94 %
Brady Statistic AP VS Percent: 1 %
Brady Statistic AS VP Percent: 1.1 %
Brady Statistic AS VS Percent: 3.5 %
Brady Statistic RA Percent Paced: 12 %
Date Time Interrogation Session: 20220308020035
HighPow Impedance: 43 Ohm
HighPow Impedance: 43 Ohm
Implantable Lead Implant Date: 20030319
Implantable Lead Implant Date: 20030319
Implantable Lead Implant Date: 20110211
Implantable Lead Location: 753858
Implantable Lead Location: 753859
Implantable Lead Location: 753860
Implantable Lead Model: 158
Implantable Lead Model: 4087
Implantable Lead Serial Number: 113412
Implantable Lead Serial Number: 159867
Implantable Pulse Generator Implant Date: 20160516
Lead Channel Impedance Value: 380 Ohm
Lead Channel Impedance Value: 450 Ohm
Lead Channel Impedance Value: 740 Ohm
Lead Channel Pacing Threshold Amplitude: 0.75 V
Lead Channel Pacing Threshold Amplitude: 1 V
Lead Channel Pacing Threshold Amplitude: 2.875 V
Lead Channel Pacing Threshold Pulse Width: 0.5 ms
Lead Channel Pacing Threshold Pulse Width: 1 ms
Lead Channel Pacing Threshold Pulse Width: 1 ms
Lead Channel Sensing Intrinsic Amplitude: 0.7 mV
Lead Channel Sensing Intrinsic Amplitude: 12 mV
Lead Channel Setting Pacing Amplitude: 2.5 V
Lead Channel Setting Pacing Amplitude: 2.5 V
Lead Channel Setting Pacing Amplitude: 3.375
Lead Channel Setting Pacing Pulse Width: 0.5 ms
Lead Channel Setting Pacing Pulse Width: 1 ms
Lead Channel Setting Sensing Sensitivity: 0.5 mV
Pulse Gen Serial Number: 7247398

## 2020-03-10 NOTE — Progress Notes (Unsigned)
Cardiology Office Note Date:  03/10/2020  Patient ID:  Mark Nguyen, Mark Nguyen March 05, 1931, MRN [id number] PCP:  Lacinda Axon, MD  Cardiologist/Electrophysiologist: Dr. Lovena Le    Chief Complaint: post DCCV   History of Present Illness: Mark Nguyen is a 87 y.o. male with history of LBBB, ICM, chronic CHF (systolic), HTN, HLD, VT, ICD,  CAD (chart reveiw mentions he had hx of remote MI and PCI 1992), AFib   He had a stress test Jan 2020, abnormal with large fixed defect and low EF, started on Imdur with a note mentioning if ongoing CP would need cath.  He comes in today to be seen for Dr. Lovena Le, last seen by him Sept 2020, at that time doing well, no VT, his amio reduced.  Most recently saw A. Tillery, PA for increased AFib burden Oct 2021.  The pt reported some SOB that attributed this to sinus issues.  He wwas in SR that day, burden 28%, reported as unchanged and no adjustments were made. Described as deconditioned.  Telephone notes report pt called with some shoulder pain and asked for an appt, wondered about his heart rhythm, this prompted a manual transmission noting an ongoing AF episode since Nov and was given an appt.  I saw him 01/02/20 He comes accompanied by his grandson, the patient is very HOH. He states that for abut 2 weeks or so he has had a pulsating discomfort that seems to start in his shoulder and radiates to his scapula and down his arm.  It is a discmofort/pain, it also radiates towards his ICD. IT can happen seated or walking around. Moving his arm in any way does not seem to provoke or change it. Walking/exertion does not trigger it necessarily or change it when it is happening. It has been happening every day for at least 2 weeks, comes and goes.  Lasts 15-30 minutes. No associated SOB, diaphoresis, nausea. He has noticed that when he presses lightly on his ICD this helps and seems to ease it off.  He does not have palpitations or CP otherwise. No near  syncope or syncope. No rest SOB, he get winded when walking up the small hill by his house, this is not new and he says he figures reasonable for an 87 y/o. He says that when he had his MI his arm hurt, he says it was a nagging ache that over a couple days worsened to a severe discomfort and pain.  This is why he became concerned about his current symptom. No bleeding or signs of bleeding AF burden was up, he was in a AFlutter, planned to increase his amiodarone and DCCV.  CorVue was suggestive of volume and lasix double for a couple days.  Pre-DCCV COVID test was + (without symptoms), and DCCV postponed, underwent DCCV 02/14/20 successful.  TODAY He is doing OK. No CP or palpitations, no cardiac awareness. He is most bothered by nasal congestion.  Says he has tried claritin, sudafed without much benefit, flonase seems to help only temporarily.  No symptoms of fever, illness. No cough. He denies rest SOB,intermittently will feel like he gets winded easier then other days, denies trouble/difficulty with ADLs. Some nights he feels when he first lays dawn to go to sleep he is a bit winded, but this settles, and he falls asleep.  He does not have to sit up to breathe easier, is not woken with SOB.  No dizzy spells, near syncope or syncope.   Device information [employer]  Jude ICD implanted 2003 -> Biv upgrade 2011, gen change 05/2014  History of appropriate therapy: Yes History of AAD therapy: Yes; currently on amiodarone    Past Medical History:  Diagnosis Date  . CAD (coronary artery disease)   . CHF (congestive heart failure) ([place])   . Dyslipidemia   . History of ventricular fibrillation   . HTN (hypertension)   . Hx-sudden cardiac arrest   . Ischemic cardiomyopathy   . LBBB (left bundle branch block)   . Ventricular tachycardia [facility])     Past Surgical History:  Procedure Laterality Date  . CARDIAC CATHETERIZATION  10/12/2008  . CARDIAC DEFIBRILLATOR PLACEMENT     [employer]  .  CARDIOVERSION N/A 06/28/2019   Procedure: CARDIOVERSION;  Surgeon: Lelon Perla, MD;  Location[facility] ENDOSCOPY;  Service: Cardiovascular;  Laterality: N/A;  . CARDIOVERSION N/A 02/14/2020   Procedure: CARDIOVERSION;  Surgeon: Sanda Klein, MD;  Location: [facility] ENDOSCOPY;  Service: Cardiovascular;  Laterality: N/A;  . DOPPLER ECHOCARDIOGRAPHY  2008, 2011  . EP IMPLANTABLE DEVICE N/A 05/19/2014   Procedure: ICD/BIV ICD Generator Changeout;  Surgeon: Evans Lance, MD;  Location: [place] CV LAB;  Service: Cardiovascular;  Laterality: N/A;  . TONSILECTOMY, ADENOIDECTOMY, BILATERAL MYRINGOTOMY AND TUBES      Current Outpatient Medications  Medication Sig Dispense Refill  . amiodarone (PACERONE) 200 MG tablet Take 1 tablet (200 mg total) by mouth 2 (two) times daily. 180 tablet 3  . apixaban (ELIQUIS) 5 MG TABS tablet Take 1 tablet (5 mg total) by mouth 2 (two) times daily. 60 tablet 11  . atorvastatin (LIPITOR) 20 MG tablet TAKE 1 TABLET BY MOUTH DAILY AT BEDTIME (Patient taking differently: Take 20 mg by mouth at bedtime.) 90 tablet 3  . carvedilol (COREG) 25 MG tablet TAKE 1 TABLET TWICE DAILY WITH A MEAL. (Patient taking differently: Take 25 mg by mouth in the morning and at bedtime.) 180 tablet 3  . Cholecalciferol (VITAMIN D-3) 1000 UNITS CAPS Take 1,000 Units by mouth daily.     . furosemide (LASIX) 40 MG tablet Take 1 tablet (40 mg total) by mouth daily. Hold dosage if weight < 183 pounds 90 tablet 3  . indomethacin (INDOCIN SR) 75 MG CR capsule Take 75 mg by mouth daily as needed (gout attacks).    . isosorbide mononitrate (IMDUR) 30 MG 24 hr tablet Take 1 tablet (30 mg total) by mouth daily. 90 tablet 3  . losartan (COZAAR) 25 MG tablet Take 0.5 tablets 12.5 mg at Bedtime (Patient taking differently: Take 12.5 mg by mouth at bedtime.) 90 tablet 3  . omeprazole (PRILOSEC) 20 MG capsule TAKE 1 CAPSULE DAILY (Patient taking differently: Take 20 mg by mouth daily.) 90 capsule 2  .  phenylephrine (SUDAFED PE) 10 MG TABS tablet Take 10 mg by mouth every 6 (six) hours as needed (congestion).    [name] spironolactone (ALDACTONE) 25 MG tablet TAKE 1/2 TABLET BY MOUTH ONCE A DAY. (Patient taking differently: Take 12.5 mg by mouth daily.) 45 tablet 1  . Tamsulosin HCl (FLOMAX) 0.4 MG CAPS Take 0.4 mg by mouth daily after breakfast.      No current facility-administered medications for this visit.    Allergies:   Patient has no known allergies.   Social History:  The patient  reports that he quit smoking about 35 years ago. He has never used smokeless tobacco. He reports that he does not drink alcohol and does not use drugs.   Family History:  The  patient's family history includes Coronary artery disease in an other family member.  ROS:  Please see the history of present illness.    All other systems are reviewed and otherwise negative.   PHYSICAL EXAM:  VS:  There were no vitals taken for this visit. BMI: There is no height or weight on file to calculate BMI. Well nourished, well developed, in no acute distress HEENT: normocephalic, atraumatic Neck: no JVD, carotid bruits or masses Cardiac:  RRR,  no significant murmurs, no rubs, or gallops Lungs:  CTA b/l, no wheezing, rhonchi or rales Abd: soft, nontender MS: no deformity, age appropriate atrophy Ext:  no edema Skin: warm and dry, no rash Neuro:  No gross deficits appreciated Psych: euthymic mood, full affect  ICD site is stable, no tethering or discomfort   EKG:  Done today and reviewed by myself shows  AV paced  Device interrogation done today and reviewed by myself:  Battery is at 36mo  Lead measurements are stable A Lead threshold up slightly and output adjusted for 2:1 margin given today he Apaces at 40  AF burden is down some by graphic, though continues to have brief episodes post DCCV   11/15/2018: TTE IMPRESSIONS  1. There is akinesis of the entire inferior wall, extending into the  apex. 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  Globally the EF is ~35-40%. There is global hypokinesis in addition to the  above regional wall motion  abnormalities. Contrast was used and there is no evidence of thrombus in  the LV apex. This is consistent with a prior inferior wall infarction. The  last echo report from 2011 reports an EF 40-45% but was unable to be  viewed for comparison.  2. Left ventricular diastolic parameters are consistent with Grade III  diastolic dysfunction (restrictive).  3. Elevated left atrial pressure.  4. Definity contrast agent was given IV to delineate the left ventricular  endocardial borders.  5. Mildly dilated left ventricular internal cavity size.  6. The left ventricle demonstrates regional wall motion abnormalities.  7. Global right ventricle has mildly reduced systolic function.The right  ventricular size is normal. No increase in right ventricular wall  thickness.  8. Left atrial size was severely dilated.  9. Right atrial size was moderately dilated.  10. The tricuspid valve is grossly normal. Tricuspid valve regurgitation  is mild.  11. The aortic valve is tricuspid. Aortic valve regurgitation is mild.  Mild to moderate aortic valve sclerosis/calcification without any evidence  of aortic stenosis.  12. The pulmonic valve was grossly normal. Pulmonic valve regurgitation is  trivial.  13. Moderately elevated pulmonary artery systolic pressure.  14. The tricuspid regurgitant velocity is 3.01 m/s, and with an assumed  right atrial pressure of 15 mmHg, the estimated right ventricular systolic  pressure is moderately elevated at 51.2 mmHg.  15. A venous catheter is visualized in the RA and RV.  16. The inferior vena cava is dilated in size with <50% respiratory  variability, suggesting right atrial pressure of 15 mmHg.  17. The mitral valve is degenerative. Mild mitral valve regurgitation.  18. Mild mitral annular  calcification.    01/16/2018: stress myoview  The left ventricular ejection fraction is severely decreased (<30%).  Nuclear stress EF: 24%.  There was no ST segment deviation noted during stress.  Defect 1: There is a large defect of severe severity present in the basal inferoseptal, basal inferior, basal inferolateral, mid inferoseptal, mid inferior, apical  inferior and apex location.  Findings consistent with prior myocardial infarction.  This is a high risk study.   There is a large irreversible defect in the basal and mid inferoseptal, inferior, basal inferolateral, apical inferior walls and in the true apex consistent with a large infarct in the RCA territory. This study is high risk given large infarct and severe LV dysfunction but there is no ischemia.     Recent Labs: 03/18/2019: ALT 25; TSH 2.960 06/18/2019: Magnesium 2.1 01/24/2020: Platelets 206 02/14/2020: BUN 27; Creatinine, Ser 1.30; Hemoglobin 11.9; Potassium 4.1; Sodium 139  No results found for requested labs within last 8760 hours.   CrCl cannot be calculated (Patient's most recent lab result is older than the maximum 21 days allowed.).   Wt Readings from Last 3 Encounters:  02/14/20 194 lb (88 kg)  01/24/20 190 lb (86.2 kg)  01/02/20 197 lb (89.4 kg)     Other studies reviewed: Additional studies/records reviewed today include: summarized above  ASSESSMENT AND PLAN:  1. ICD     Intact function, note above  2. NICM 3. Chronic CHF (systolic)     On BB, lasix, nitrate, ARB     No symptoms or esam findings of volume OL     CorVue at threshold, wobble up/down     Weight is up 3lbs from last visit down 4 from the visit before  No changes today, I have asked him to call if he develops increased/changing SOB I have asked him to see/call his PMD for his sinus congestion  4. CAD     No anginal symptoms     On BB, statin, no ASA with eliquis  5. Persistent AFib     CHA2DS2Vasc is 5, on eliquis,  appropriately dosed     On amiodarone 200mg  BID, s/p DCCV he sontinue sto have short episodes of AF     BP% reduced with AF/VS  7. VT     None noted    Disposition: amio labs today, AF burden is down some though still having some, affecting BP%. When in AF, on 25mg  BID of coreg, and 200mg  BID of amio.  Follow his burden for now, looks an atypical flutter by EKG, dont think he is an Ablation candidate. Will have him see Dr. Lovena Le in a couple months, sooner if needed, or AF burden increases again, follow his battery monthly now.   Current medicines are reviewed at length with the patient today.  The patient did not have any concerns regarding medicines.  Venetia Night, PA-C 03/10/2020 7:24 AM     [place] [address] [phone] (office)  [phone] (fax)

## 2020-03-10 NOTE — Progress Notes (Signed)
Received notification from [place] ([place]) regarding approval for [employer]. Patient assistance approved from 03/07/2020 to 01/02/2021.  Meds will ship to patients home.   Refills done by [facility]: [phone]  [facility] Phone: [phone]

## 2020-03-12 ENCOUNTER — Ambulatory Visit (INDEPENDENT_AMBULATORY_CARE_PROVIDER_SITE_OTHER): Payer: Medicare Other | Admitting: Physician Assistant

## 2020-03-12 ENCOUNTER — Other Ambulatory Visit: Payer: Self-pay

## 2020-03-12 ENCOUNTER — Encounter: Payer: Self-pay | Admitting: Physician Assistant

## 2020-03-12 VITALS — BP 120/60 | HR 60 | Ht 67.0 in | Wt 193.8 lb

## 2020-03-12 DIAGNOSIS — I472 Ventricular tachycardia, unspecified: Secondary | ICD-10-CM

## 2020-03-12 DIAGNOSIS — I428 Other cardiomyopathies: Secondary | ICD-10-CM | POA: Diagnosis not present

## 2020-03-12 DIAGNOSIS — I2510 Atherosclerotic heart disease of native coronary artery without angina pectoris: Secondary | ICD-10-CM | POA: Diagnosis not present

## 2020-03-12 DIAGNOSIS — I4819 Other persistent atrial fibrillation: Secondary | ICD-10-CM

## 2020-03-12 DIAGNOSIS — Z79899 Other long term (current) drug therapy: Secondary | ICD-10-CM

## 2020-03-12 DIAGNOSIS — Z95810 Presence of automatic (implantable) cardiac defibrillator: Secondary | ICD-10-CM | POA: Diagnosis not present

## 2020-03-12 DIAGNOSIS — I5022 Chronic systolic (congestive) heart failure: Secondary | ICD-10-CM | POA: Diagnosis not present

## 2020-03-12 LAB — TSH: TSH: 3.22 u[IU]/mL (ref 0.450–4.500)

## 2020-03-12 LAB — HEPATIC FUNCTION PANEL
ALT: 20 IU/L (ref 0–44)
AST: 23 IU/L (ref 0–40)
Albumin: 4 g/dL (ref 3.6–4.6)
Alkaline Phosphatase: 80 IU/L (ref 44–121)
Bilirubin Total: 0.7 mg/dL (ref 0.0–1.2)
Bilirubin, Direct: 0.23 mg/dL (ref 0.00–0.40)
Total Protein: 6.2 g/dL (ref 6.0–8.5)

## 2020-03-12 NOTE — Patient Instructions (Signed)
Medication Instructions:   Your physician recommends that you continue on your current medications as directed. Please refer to the Current Medication list given to you today.  *If you need a refill on your cardiac medications before your next appointment, please call your pharmacy*   Lab Work: LFT AND TSH TODAY   If you have labs (blood work) drawn today and your tests are completely normal, you will receive your results only by: [name] MyChart Message (if you have MyChart) OR . A paper copy in the mail If you have any lab test that is abnormal or we need to change your treatment, we will call you to review the results.   Testing/Procedures: NONE ORDERED  TODAY    Follow-Up: At [facility], you and your health needs are our priority.  As part of our continuing mission to provide you with exceptional heart care, we have created designated Provider Care Teams.  These Care Teams include your primary Cardiologist (physician) and Advanced Practice Providers (APPs -  Physician Assistants and Nurse Practitioners) who all work together to provide you with the care you need, when you need it.  We recommend signing up for the patient portal called "MyChart".  Sign up information is provided on this After Visit Summary.  MyChart is used to connect with patients for Virtual Visits (Telemedicine).  Patients are able to view lab/test results, encounter notes, upcoming appointments, etc.  Non-urgent messages can be sent to your provider as well.   To learn more about what you can do with MyChart, go to [email or web address].    Your next appointment:   2 month(s)  The format for your next appointment:   In Person  Provider:   Cristopher Peru, MD ONLY    Other Instructions

## 2020-03-18 NOTE — Progress Notes (Signed)
Remote ICD transmission.   

## 2020-03-19 ENCOUNTER — Other Ambulatory Visit: Payer: Self-pay | Admitting: Internal Medicine

## 2020-04-09 ENCOUNTER — Ambulatory Visit (INDEPENDENT_AMBULATORY_CARE_PROVIDER_SITE_OTHER): Payer: Medicare Other

## 2020-04-09 DIAGNOSIS — I428 Other cardiomyopathies: Secondary | ICD-10-CM

## 2020-04-10 LAB — CUP PACEART REMOTE DEVICE CHECK
Battery Remaining Longevity: 7 mo
Battery Remaining Percentage: 16 %
Battery Voltage: 2.74 V
Brady Statistic AP VP Percent: 99 %
Brady Statistic AP VS Percent: 1 %
Brady Statistic AS VP Percent: 1 %
Brady Statistic AS VS Percent: 1 %
Brady Statistic RA Percent Paced: 81 %
Date Time Interrogation Session: 20220407040036
HighPow Impedance: 46 Ohm
HighPow Impedance: 46 Ohm
Implantable Lead Implant Date: 20030319
Implantable Lead Implant Date: 20030319
Implantable Lead Implant Date: 20110211
Implantable Lead Location: 753858
Implantable Lead Location: 753859
Implantable Lead Location: 753860
Implantable Lead Model: 158
Implantable Lead Model: 4087
Implantable Lead Serial Number: 113412
Implantable Lead Serial Number: 159867
Implantable Pulse Generator Implant Date: 20160516
Lead Channel Impedance Value: 380 Ohm
Lead Channel Impedance Value: 410 Ohm
Lead Channel Impedance Value: 740 Ohm
Lead Channel Pacing Threshold Amplitude: 0.75 V
Lead Channel Pacing Threshold Amplitude: 1.25 V
Lead Channel Pacing Threshold Amplitude: 3.25 V
Lead Channel Pacing Threshold Pulse Width: 0.5 ms
Lead Channel Pacing Threshold Pulse Width: 1 ms
Lead Channel Pacing Threshold Pulse Width: 1 ms
Lead Channel Sensing Intrinsic Amplitude: 1 mV
Lead Channel Sensing Intrinsic Amplitude: 12 mV
Lead Channel Setting Pacing Amplitude: 2.5 V
Lead Channel Setting Pacing Amplitude: 3 V
Lead Channel Setting Pacing Amplitude: 3.75 V
Lead Channel Setting Pacing Pulse Width: 0.5 ms
Lead Channel Setting Pacing Pulse Width: 1 ms
Lead Channel Setting Sensing Sensitivity: 0.5 mV
Pulse Gen Serial Number: 7247398

## 2020-04-17 ENCOUNTER — Other Ambulatory Visit: Payer: Self-pay | Admitting: *Deleted

## 2020-04-17 NOTE — Patient Instructions (Signed)
Goals Addressed            This Visit's Progress   . (THN)Maintain My Quality of Life       Timeframe:  Long-Range Goal Priority:  Medium Start Date: [id number]                            Expected End Date:  1202022                 Follow Up Date [id number] - spend time outdoors at least 3 times a week    Why is this important?   Having a long-term illness can be scary.  It can also be stressful for you and your caregiver.  These steps may help.    Notes:  Patient loves mowing and taking care of yard.  He loves to do his own cooking and cooks for family to come and eat dinner Patient had COVID and once symptoms better resumed doing his own grocery shopping [id number] Patient is going to schedule his 2nd COVID booster    . [facility] and Keep All Appointments       Timeframe:  Long-Range Goal Priority:  High Start Date:     [id number]                        Expected End Date:   44920100                Follow Up Date [id number]   - call to cancel if needed - keep a calendar with prescription refill dates - keep a calendar with appointment dates    Why is this important?   Part of staying healthy is seeing the doctor for follow-up care.  If you forget your appointments, there are some things you can do to stay on track.    Notes:  Patient is planning ahead on renewing application for Eliquis before the expiration date Patient needs to keep all cardiology appointments and ICD checks    . (THN)Track and Manage Heart Rate and Rhythm       Timeframe:  Long-Range Goal Priority:  High Start Date: [id number]                            Expected End Date:    64158309       Follow Up Date [id number] - make a plan to eat healthy - take medicine as prescribed    Why is this important?   Atrial fibrillation may have no symptoms. Sometimes the symptoms get worse or happen more often.  It is important to keep track of what your symptoms are and when they happen.  A change in symptoms is  important to discuss with your doctor or nurse.  Being active and healthy eating will also help you manage your heart condition.     Notes: Remote checks q 3 months Follow up with All Cardiology appointments

## 2020-04-17 NOTE — Patient Outreach (Signed)
Mark Nguyen) Care Management  [place]  04/17/2020   Mark Nguyen 04/14/1931 [id number]  RN Health Coach telephone call to patient.  [employer] compliance verified. Per patient he is doing good. Patient stated that he does tire easily when walking. Patient stated he can't stand very long. He is taking his medication as per order. With the prescription plan he is able to afford medications.  Patient is scheduling his 2nd booster. Patient has a agreed to follow up outreach calls.   Encounter Medications:  Outpatient Encounter Medications as of 04/17/2020  Medication Sig  . amiodarone (PACERONE) 200 MG tablet Take 1 tablet (200 mg total) by mouth 2 (two) times daily.  [name] apixaban (ELIQUIS) 5 MG TABS tablet Take 1 tablet (5 mg total) by mouth 2 (two) times daily.  [name] atorvastatin (LIPITOR) 20 MG tablet TAKE 1 TABLET BY MOUTH ONCE DAILY AT BEDTIME.  . carvedilol (COREG) 25 MG tablet TAKE 1 TABLET TWICE DAILY WITH A MEAL. (Patient taking differently: Take 25 mg by mouth in the morning and at bedtime.)  . Cholecalciferol (VITAMIN D-3) 1000 UNITS CAPS Take 1,000 Units by mouth daily.   . furosemide (LASIX) 40 MG tablet Take 1 tablet (40 mg total) by mouth daily. Hold dosage if weight < 183 pounds  . indomethacin (INDOCIN SR) 75 MG CR capsule Take 75 mg by mouth daily as needed (gout attacks).  . isosorbide mononitrate (IMDUR) 30 MG 24 hr tablet Take 1 tablet (30 mg total) by mouth daily.  [name] losartan (COZAAR) 25 MG tablet Take 0.5 tablets 12.5 mg at Bedtime (Patient taking differently: Take 12.5 mg by mouth at bedtime.)  . omeprazole (PRILOSEC) 20 MG capsule TAKE 1 CAPSULE DAILY (Patient taking differently: Take 20 mg by mouth daily.)  . phenylephrine (SUDAFED PE) 10 MG TABS tablet Take 10 mg by mouth every 6 (six) hours as needed (congestion).  [name] spironolactone (ALDACTONE) 25 MG tablet TAKE 1/2 TABLET BY MOUTH ONCE A DAY. (Patient taking differently: Take 12.5 mg by mouth  daily.)  . Tamsulosin HCl (FLOMAX) 0.4 MG CAPS Take 0.4 mg by mouth daily after breakfast.    No facility-administered encounter medications on file as of 04/17/2020.    Functional Status:  No flowsheet data found.  Fall/Depression Screening: Fall Risk  06/10/2019 02/19/2019 11/28/2018  Falls in the past year? 0 0 0  Number falls in past yr: 0 0 -  Injury with Fall? 0 0 -  Risk for fall due to : - - Impaired balance/gait  Follow up Falls evaluation completed;Falls prevention discussed Falls evaluation completed;Falls prevention discussed -   PHQ 2/9 Scores 09/10/2019 06/10/2019 03/06/2019 02/19/2019 02/05/2019 01/25/2019 12/07/2018  PHQ - 2 Score 0 0 0 0 0 0 0  PHQ- 9 Score - - - - - - -    Assessment:  Goals Addressed            This Visit's Progress   . (THN)Maintain My Quality of Life       Timeframe:  Long-Range Goal Priority:  Medium Start Date: [id number]                            Expected End Date:  1202022                 Follow Up Date [id number] - spend time outdoors at least 3 times a week    Why is this important?   Having  a long-term illness can be scary.  It can also be stressful for you and your caregiver.  These steps may help.    Notes:  Patient loves mowing and taking care of yard.  He loves to do his own cooking and cooks for family to come and eat dinner Patient had COVID and once symptoms better resumed doing his own grocery shopping [id number] Patient is going to schedule his 2nd COVID booster    . [facility] and Keep All Appointments       Timeframe:  Long-Range Goal Priority:  High Start Date:     [id number]                        Expected End Date:   85027741                Follow Up Date [id number]   - call to cancel if needed - keep a calendar with prescription refill dates - keep a calendar with appointment dates    Why is this important?   Part of staying healthy is seeing the doctor for follow-up care.  If you forget your appointments, there are some  things you can do to stay on track.    Notes:  Patient is planning ahead on renewing application for Eliquis before the expiration date Patient needs to keep all cardiology appointments and ICD checks    . (THN)Track and Manage Heart Rate and Rhythm       Timeframe:  Long-Range Goal Priority:  High Start Date: [id number]                            Expected End Date:    28366294       Follow Up Date [id number] - make a plan to eat healthy - take medicine as prescribed    Why is this important?   Atrial fibrillation may have no symptoms. Sometimes the symptoms get worse or happen more often.  It is important to keep track of what your symptoms are and when they happen.  A change in symptoms is important to discuss with your doctor or nurse.  Being active and healthy eating will also help you manage your heart condition.     Notes: Remote checks q 3 months Follow up with All Cardiology appointments       Plan:  Follow-up:  Patient agrees to Care Plan and Follow-up. Patient will schedule 2nd COVID booster RN will follow up within the month of July RN sent update assessment to PCP  [place] [facility] [phone]

## 2020-04-22 ENCOUNTER — Ambulatory Visit: Payer: Medicare Other | Admitting: *Deleted

## 2020-04-22 NOTE — Addendum Note (Signed)
Addended by: Cheri Boeder A on: 04/22/2020 09:15 AM   Modules accepted: Level of Service

## 2020-04-22 NOTE — Progress Notes (Signed)
Remote ICD transmission.   

## 2020-04-24 DIAGNOSIS — R351 Nocturia: Secondary | ICD-10-CM | POA: Diagnosis not present

## 2020-04-24 DIAGNOSIS — C61 Malignant neoplasm of prostate: Secondary | ICD-10-CM | POA: Diagnosis not present

## 2020-04-24 DIAGNOSIS — N3289 Other specified disorders of bladder: Secondary | ICD-10-CM | POA: Diagnosis not present

## 2020-05-12 ENCOUNTER — Ambulatory Visit (INDEPENDENT_AMBULATORY_CARE_PROVIDER_SITE_OTHER): Payer: Medicare Other

## 2020-05-12 DIAGNOSIS — I428 Other cardiomyopathies: Secondary | ICD-10-CM

## 2020-05-14 ENCOUNTER — Telehealth: Payer: Self-pay

## 2020-05-14 LAB — CUP PACEART REMOTE DEVICE CHECK
Battery Remaining Longevity: 7 mo
Battery Remaining Longevity: 7 mo
Battery Remaining Percentage: 14 %
Battery Remaining Percentage: 14 %
Battery Voltage: 2.72 V
Battery Voltage: 2.72 V
Brady Statistic AP VP Percent: 99 %
Brady Statistic AP VP Percent: 99 %
Brady Statistic AP VS Percent: 1 %
Brady Statistic AP VS Percent: 1 %
Brady Statistic AS VP Percent: 1 %
Brady Statistic AS VP Percent: 1 %
Brady Statistic AS VS Percent: 1 %
Brady Statistic AS VS Percent: 1 %
Brady Statistic RA Percent Paced: 62 %
Brady Statistic RA Percent Paced: 61 %
Date Time Interrogation Session: 20220511112145
Date Time Interrogation Session: 20220512040021
HighPow Impedance: 47 Ohm
HighPow Impedance: 48 Ohm
HighPow Impedance: 47 Ohm
HighPow Impedance: 48 Ohm
Implantable Lead Implant Date: 20030319
Implantable Lead Implant Date: 20030319
Implantable Lead Implant Date: 20110211
Implantable Lead Implant Date: 20030319
Implantable Lead Implant Date: 20030319
Implantable Lead Implant Date: 20110211
Implantable Lead Location: 753858
Implantable Lead Location: 753859
Implantable Lead Location: 753860
Implantable Lead Location: 753858
Implantable Lead Location: 753859
Implantable Lead Location: 753860
Implantable Lead Model: 158
Implantable Lead Model: 4087
Implantable Lead Model: 158
Implantable Lead Model: 4087
Implantable Lead Serial Number: 113412
Implantable Lead Serial Number: 159867
Implantable Lead Serial Number: 113412
Implantable Lead Serial Number: 159867
Implantable Pulse Generator Implant Date: 20160516
Implantable Pulse Generator Implant Date: 20160516
Lead Channel Impedance Value: 380 Ohm
Lead Channel Impedance Value: 430 Ohm
Lead Channel Impedance Value: 730 Ohm
Lead Channel Impedance Value: 380 Ohm
Lead Channel Impedance Value: 430 Ohm
Lead Channel Impedance Value: 800 Ohm
Lead Channel Pacing Threshold Amplitude: 0.75 V
Lead Channel Pacing Threshold Amplitude: 1.25 V
Lead Channel Pacing Threshold Amplitude: 2.5 V
Lead Channel Pacing Threshold Amplitude: 0.75 V
Lead Channel Pacing Threshold Amplitude: 1.25 V
Lead Channel Pacing Threshold Amplitude: 2.25 V
Lead Channel Pacing Threshold Pulse Width: 0.5 ms
Lead Channel Pacing Threshold Pulse Width: 1 ms
Lead Channel Pacing Threshold Pulse Width: 1 ms
Lead Channel Pacing Threshold Pulse Width: 0.5 ms
Lead Channel Pacing Threshold Pulse Width: 1 ms
Lead Channel Pacing Threshold Pulse Width: 1 ms
Lead Channel Sensing Intrinsic Amplitude: 11.6 mV
Lead Channel Sensing Intrinsic Amplitude: 2.1 mV
Lead Channel Sensing Intrinsic Amplitude: 10.1 mV
Lead Channel Sensing Intrinsic Amplitude: 2.1 mV
Lead Channel Setting Pacing Amplitude: 2.5 V
Lead Channel Setting Pacing Amplitude: 3 V
Lead Channel Setting Pacing Amplitude: 3 V
Lead Channel Setting Pacing Amplitude: 2.5 V
Lead Channel Setting Pacing Amplitude: 2.75 V
Lead Channel Setting Pacing Amplitude: 3 V
Lead Channel Setting Pacing Pulse Width: 0.5 ms
Lead Channel Setting Pacing Pulse Width: 1 ms
Lead Channel Setting Pacing Pulse Width: 0.5 ms
Lead Channel Setting Pacing Pulse Width: 1 ms
Lead Channel Setting Sensing Sensitivity: 0.5 mV
Lead Channel Setting Sensing Sensitivity: 0.5 mV
Pulse Gen Serial Number: 7247398
Pulse Gen Serial Number: 7247398

## 2020-05-14 NOTE — Telephone Encounter (Signed)
Scheduled remote received 05/14/20. Presenting rhythm AF/VS 90's. DCCV completed 02/14/20. Patient saw [facility] [place] 03/12/20 and was in SR. Attempted to call patient to send manual transmission to assess presenting, assess s/s and medication compliance. No answer, LMOVM.

## 2020-05-19 NOTE — Telephone Encounter (Signed)
Contacted patient. Patient states that he feels fine with no symptoms today. Patient states compliance with medications Eliquis 5 mg's. Last transmission received on 05/14/20. I ask patient to send another transmission today and that if the [facility] did not receive the transmission that we would contact the patient and help him send a manual one. Patient was also advised about monthly battery checks due to ERI 7.1 months.

## 2020-05-19 NOTE — Telephone Encounter (Signed)
Transmission received.

## 2020-05-20 NOTE — Telephone Encounter (Signed)
Patient transmission from 05/19/20. Patient not having any symptoms and states medication complice as noted previously. Routing to Dr.Taylor to make aware.

## 2020-05-25 ENCOUNTER — Other Ambulatory Visit: Payer: Self-pay | Admitting: Student

## 2020-05-25 DIAGNOSIS — I5022 Chronic systolic (congestive) heart failure: Secondary | ICD-10-CM

## 2020-05-25 NOTE — Telephone Encounter (Signed)
No change 

## 2020-05-26 ENCOUNTER — Other Ambulatory Visit: Payer: Self-pay

## 2020-05-26 ENCOUNTER — Ambulatory Visit (INDEPENDENT_AMBULATORY_CARE_PROVIDER_SITE_OTHER): Payer: Medicare Other | Admitting: Internal Medicine

## 2020-05-26 ENCOUNTER — Encounter: Payer: Self-pay | Admitting: Internal Medicine

## 2020-05-26 VITALS — BP 124/70 | HR 72 | Ht 67.0 in | Wt 191.6 lb

## 2020-05-26 DIAGNOSIS — I472 Ventricular tachycardia: Secondary | ICD-10-CM

## 2020-05-26 DIAGNOSIS — Z95810 Presence of automatic (implantable) cardiac defibrillator: Secondary | ICD-10-CM

## 2020-05-26 DIAGNOSIS — I5022 Chronic systolic (congestive) heart failure: Secondary | ICD-10-CM

## 2020-05-26 DIAGNOSIS — I2510 Atherosclerotic heart disease of native coronary artery without angina pectoris: Secondary | ICD-10-CM | POA: Diagnosis not present

## 2020-05-26 DIAGNOSIS — I4729 Other ventricular tachycardia: Secondary | ICD-10-CM

## 2020-05-26 NOTE — Progress Notes (Signed)
HPI [name] returns today for followup. He is a pleasant 87 yo man with a h/o chronic systolic heart failure, dyslipidemia, VT, s/p ICD insertion. He has developed atrial fib, was cardioverted and had ERAF despite being on amiodarone. He denies medical non-compliance. He has not had chest pain. He has class 2B CHF symptoms. No ICD shocks. No Known Allergies   Current Outpatient Medications  Medication Sig Dispense Refill  . apixaban (ELIQUIS) 5 MG TABS tablet Take 1 tablet (5 mg total) by mouth 2 (two) times daily. 60 tablet 11  . atorvastatin (LIPITOR) 20 MG tablet TAKE 1 TABLET BY MOUTH ONCE DAILY AT BEDTIME. 90 tablet 3  . carvedilol (COREG) 25 MG tablet TAKE 1 TABLET TWICE DAILY WITH A MEAL. (Patient taking differently: Take 25 mg by mouth in the morning and at bedtime.) 180 tablet 3  . Cholecalciferol (VITAMIN D-3) 1000 UNITS CAPS Take 1,000 Units by mouth daily.     . furosemide (LASIX) 40 MG tablet Take 1 tablet (40 mg total) by mouth daily. Hold dosage if weight < 183 pounds 90 tablet 3  . indomethacin (INDOCIN SR) 75 MG CR capsule Take 75 mg by mouth daily as needed (gout attacks).    . isosorbide mononitrate (IMDUR) 30 MG 24 hr tablet Take 1 tablet (30 mg total) by mouth daily. 90 tablet 3  . losartan (COZAAR) 25 MG tablet TAKE 1/2 TABLET AT BEDTIME 45 tablet 3  . omeprazole (PRILOSEC) 20 MG capsule TAKE 1 CAPSULE DAILY (Patient taking differently: Take 20 mg by mouth daily.) 90 capsule 2  . phenylephrine (SUDAFED PE) 10 MG TABS tablet Take 10 mg by mouth every 6 (six) hours as needed (congestion).    [name] spironolactone (ALDACTONE) 25 MG tablet TAKE 1/2 TABLET BY MOUTH ONCE A DAY. (Patient taking differently: Take 12.5 mg by mouth daily.) 45 tablet 1  . Tamsulosin HCl (FLOMAX) 0.4 MG CAPS Take 0.4 mg by mouth daily after breakfast.      No current facility-administered medications for this visit.     Past Medical History:  Diagnosis Date  . CAD (coronary artery disease)    . CHF (congestive heart failure) ([place])   . Dyslipidemia   . History of ventricular fibrillation   . HTN (hypertension)   . Hx-sudden cardiac arrest   . Ischemic cardiomyopathy   . LBBB (left bundle branch block)   . Ventricular tachycardia ([place])     ROS:   All systems reviewed and negative except as noted in the HPI.   Past Surgical History:  Procedure Laterality Date  . CARDIAC CATHETERIZATION  10/12/2008  . CARDIAC DEFIBRILLATOR PLACEMENT     St Jude  . CARDIOVERSION N/A 06/28/2019   Procedure: CARDIOVERSION;  Surgeon: Lelon Perla, MD;  Location: [facility] ENDOSCOPY;  Service: Cardiovascular;  Laterality: N/A;  . CARDIOVERSION N/A 02/14/2020   Procedure: CARDIOVERSION;  Surgeon: Sanda Klein, MD;  Location: [facility] ENDOSCOPY;  Service: Cardiovascular;  Laterality: N/A;  . DOPPLER ECHOCARDIOGRAPHY  2008, 2011  . EP IMPLANTABLE DEVICE N/A 05/19/2014   Procedure: ICD/BIV ICD Generator Changeout;  Surgeon: Evans Lance, MD;  Location: [place] CV LAB;  Service: Cardiovascular;  Laterality: N/A;  . TONSILECTOMY, ADENOIDECTOMY, BILATERAL MYRINGOTOMY AND TUBES       Family History  Problem Relation Age of Onset  . Coronary artery disease Other      Social History   Socioeconomic History  . Marital status: Divorced    Spouse name: Not  on file  . Number of children: Not on file  . Years of education: Not on file  . Highest education level: Not on file  Occupational History  . Occupation: Web designer  Tobacco Use  . Smoking status: Former Smoker    Quit date: 01/03/1985    Years since quitting: 35.4  . Smokeless tobacco: Never Used  Vaping Use  . Vaping Use: Never used  Substance and Sexual Activity  . Alcohol use: Never  . Drug use: Never  . Sexual activity: Not on file  Other Topics Concern  . Not on file  Social History Narrative  . Not on file   Social Determinants of Health   Financial Resource Strain: Not on file  Food Insecurity: No Food  Insecurity  . Worried About Charity fundraiser in the Last Year: Never true  . Ran Out of Food in the Last Year: Never true  Transportation Needs: No Transportation Needs  . Lack of Transportation (Medical): No  . Lack of Transportation (Non-Medical): No  Physical Activity: Not on file  Stress: Not on file  Social Connections: Not on file  Intimate Partner Violence: Not on file     BP 124/70   Pulse 72   Ht 5\' 7"  (1.702 m)   Wt 191 lb 9.6 oz (86.9 kg)   SpO2 99%   BMI 30.01 kg/m   Physical Exam:  Well appearing 87 yo man, NAD HEENT: Unremarkable Neck:  6 cm JVD, no thyromegally Lymphatics:  No adenopathy Back:  No CVA tenderness Lungs:  Clear with no wheezes HEART:  Regular rate rhythm, no murmurs, no rubs, no clicks Abd:  soft, positive bowel sounds, no organomegally, no rebound, no guarding Ext:  2 plus pulses, no edema, no cyanosis, no clubbing Skin:  No rashes no nodules Neuro:  CN II through XII intact, motor grossly intact  EKG - afib/flutter with a controlled VR  DEVICE  Normal device function.  See PaceArt for details.   Assess/Plan: 1. Atrial fib - he has failed amiodarone and DCCV. I asked him to stop the amiodarone and he will undergo a strategy of rate control. 2. VT - he has not had any. Hopefully his VT will remain quiet despite coming off of amiodarone. 3. Chronic systolic heart failure - his symptoms are class 2B in atrial fib, and we will stop amio. He will continue his current meds. He is on GDMT. 4. Coags - he remains without bleeding on Eliquis.  Carleene Overlie Tanveer Dobberstein,MD

## 2020-05-26 NOTE — Patient Instructions (Addendum)
Medication Instructions:  Your physician has recommended you make the following change in your medication:   1.  STOP amiodarone  Labwork: None ordered.  Testing/Procedures: None ordered.  Follow-Up: Your physician wants you to follow-up in: 4 months with Mark Peru, MD   September 25, 2020 at 10;30 am  Remote monitoring is used to monitor your ICD from home. This monitoring reduces the number of office visits required to check your device to one time per year. It allows [name] to keep an eye on the functioning of your device to ensure it is working properly. You are scheduled for a device check from home on 06/09/2020. You may send your transmission at any time that day. If you have a wireless device, the transmission will be sent automatically. After your physician reviews your transmission, you will receive a postcard with your next transmission date.  Any Other Special Instructions Will Be Listed Below (If Applicable).  If you need a refill on your cardiac medications before your next appointment, please call your pharmacy.

## 2020-06-03 NOTE — Addendum Note (Signed)
Addended by: Cheri Jalomo A on: 06/03/2020 11:04 AM   Modules accepted: Level of Service

## 2020-06-03 NOTE — Progress Notes (Signed)
Remote ICD transmission.   

## 2020-06-09 ENCOUNTER — Ambulatory Visit (INDEPENDENT_AMBULATORY_CARE_PROVIDER_SITE_OTHER): Payer: Medicare Other

## 2020-06-09 DIAGNOSIS — I428 Other cardiomyopathies: Secondary | ICD-10-CM

## 2020-06-11 LAB — CUP PACEART REMOTE DEVICE CHECK
Battery Remaining Longevity: 6 mo
Battery Remaining Percentage: 11 %
Battery Voltage: 2.71 V
Brady Statistic AP VP Percent: 8 %
Brady Statistic AP VS Percent: 4.6 %
Brady Statistic AS VP Percent: 14 %
Brady Statistic AS VS Percent: 57 %
Brady Statistic RA Percent Paced: 1 %
Date Time Interrogation Session: 20220608145657
HighPow Impedance: 50 Ohm
HighPow Impedance: 50 Ohm
Implantable Lead Implant Date: 20030319
Implantable Lead Implant Date: 20030319
Implantable Lead Implant Date: 20110211
Implantable Lead Location: 753858
Implantable Lead Location: 753859
Implantable Lead Location: 753860
Implantable Lead Model: 158
Implantable Lead Model: 4087
Implantable Lead Serial Number: 113412
Implantable Lead Serial Number: 159867
Implantable Pulse Generator Implant Date: 20160516
Lead Channel Impedance Value: 410 Ohm
Lead Channel Impedance Value: 460 Ohm
Lead Channel Impedance Value: 760 Ohm
Lead Channel Pacing Threshold Amplitude: 0.75 V
Lead Channel Pacing Threshold Amplitude: 1.25 V
Lead Channel Pacing Threshold Amplitude: 2.75 V
Lead Channel Pacing Threshold Pulse Width: 0.5 ms
Lead Channel Pacing Threshold Pulse Width: 1 ms
Lead Channel Pacing Threshold Pulse Width: 1 ms
Lead Channel Sensing Intrinsic Amplitude: 0.6 mV
Lead Channel Sensing Intrinsic Amplitude: 12 mV
Lead Channel Setting Pacing Amplitude: 2.5 V
Lead Channel Setting Pacing Amplitude: 3 V
Lead Channel Setting Pacing Amplitude: 3.25 V
Lead Channel Setting Pacing Pulse Width: 0.5 ms
Lead Channel Setting Pacing Pulse Width: 1 ms
Lead Channel Setting Sensing Sensitivity: 0.5 mV
Pulse Gen Serial Number: 7247398

## 2020-07-01 NOTE — Progress Notes (Signed)
Remote ICD transmission.   

## 2020-07-10 ENCOUNTER — Ambulatory Visit (INDEPENDENT_AMBULATORY_CARE_PROVIDER_SITE_OTHER): Payer: Medicare Other

## 2020-07-10 DIAGNOSIS — I428 Other cardiomyopathies: Secondary | ICD-10-CM

## 2020-07-10 LAB — CUP PACEART REMOTE DEVICE CHECK
Battery Remaining Longevity: 5 mo
Battery Remaining Percentage: 9 %
Battery Voltage: 2.66 V
Brady Statistic AP VP Percent: 94 %
Brady Statistic AP VS Percent: 1 %
Brady Statistic AS VP Percent: 5 %
Brady Statistic AS VS Percent: 1 %
Brady Statistic RA Percent Paced: 23 %
Date Time Interrogation Session: 20220707040017
HighPow Impedance: 43 Ohm
HighPow Impedance: 43 Ohm
Implantable Lead Implant Date: 20030319
Implantable Lead Implant Date: 20030319
Implantable Lead Implant Date: 20110211
Implantable Lead Location: 753858
Implantable Lead Location: 753859
Implantable Lead Location: 753860
Implantable Lead Model: 158
Implantable Lead Model: 4087
Implantable Lead Serial Number: 113412
Implantable Lead Serial Number: 159867
Implantable Pulse Generator Implant Date: 20160516
Lead Channel Impedance Value: 390 Ohm
Lead Channel Impedance Value: 410 Ohm
Lead Channel Impedance Value: 730 Ohm
Lead Channel Pacing Threshold Amplitude: 0.75 V
Lead Channel Pacing Threshold Amplitude: 1.25 V
Lead Channel Pacing Threshold Amplitude: 3.125 V
Lead Channel Pacing Threshold Pulse Width: 0.5 ms
Lead Channel Pacing Threshold Pulse Width: 1 ms
Lead Channel Pacing Threshold Pulse Width: 1 ms
Lead Channel Sensing Intrinsic Amplitude: 12 mV
Lead Channel Sensing Intrinsic Amplitude: 2.2 mV
Lead Channel Setting Pacing Amplitude: 2.5 V
Lead Channel Setting Pacing Amplitude: 3 V
Lead Channel Setting Pacing Amplitude: 3.625
Lead Channel Setting Pacing Pulse Width: 0.5 ms
Lead Channel Setting Pacing Pulse Width: 1 ms
Lead Channel Setting Sensing Sensitivity: 0.5 mV
Pulse Gen Serial Number: 7247398

## 2020-07-20 ENCOUNTER — Other Ambulatory Visit: Payer: Self-pay | Admitting: Internal Medicine

## 2020-07-27 ENCOUNTER — Other Ambulatory Visit: Payer: Self-pay | Admitting: Internal Medicine

## 2020-07-27 DIAGNOSIS — I5022 Chronic systolic (congestive) heart failure: Secondary | ICD-10-CM

## 2020-07-30 ENCOUNTER — Other Ambulatory Visit: Payer: Self-pay | Admitting: *Deleted

## 2020-07-30 ENCOUNTER — Encounter: Payer: Self-pay | Admitting: *Deleted

## 2020-07-30 NOTE — Patient Instructions (Signed)
Goals Addressed             This Visit's Progress    (THN)Maintain My Quality of Life   On track    Timeframe:  Long-Range Goal Priority:  Medium Start Date: [id number]                            Expected End Date:  1202022                 Follow Up Date [id number] - spend time outdoors at least 3 times a week    Why is this important?   Having a long-term illness can be scary.  It can also be stressful for you and your caregiver.  These steps may help.    Notes:  Patient loves mowing and taking care of yard.  He loves to do his own cooking and cooks for family to come and eat dinner Patient had [place] and once symptoms better resumed doing his own grocery shopping [id number] Patient is going to schedule his 2nd [place] booster [id number]  Patient is keeping active     [facility] and Keep All Appointments   On track    Timeframe:  Long-Range Goal Priority:  High Start Date:     [id number]                        Expected End Date:   [id number]                Follow Up Date [id number]   - call to cancel if needed - keep a calendar with prescription refill dates - keep a calendar with appointment dates    Why is this important?   Part of staying healthy is seeing the doctor for follow-up care.  If you forget your appointments, there are some things you can do to stay on track.    Notes:  Patient is planning ahead on renewing application for Eliquis before the expiration date Patient needs to keep all cardiology appointments and ICD checks [phone] Patient is keeping all appointments     (THN)Track and Manage Heart Rate and Rhythm   On track    Timeframe:  Long-Range Goal Priority:  High Start Date: [id number]                            Expected End Date:    [id number]       Follow Up Date [id number] - make a plan to eat healthy - take medicine as prescribed    Why is this important?   Atrial fibrillation may have no symptoms. Sometimes the symptoms get worse or happen more often.  It  is important to keep track of what your symptoms are and when they happen.  A change in symptoms is important to discuss with your doctor or nurse.  Being active and healthy eating will also help you manage your heart condition.     Notes: Remote checks q 3 months Follow up with All Cardiology appointments

## 2020-07-31 NOTE — Patient Outreach (Signed)
[facility]) Care Management  [place]  07/31/2020   Mark Nguyen [birth year] [id number]  RN Health Coach telephone call to patient.  [employer] compliance verified.  Per patient he is not having any fluttering feelings. Per patient he has to get a battery soon for his pacemaker. He is not having any dizziness, sweating or chest pain. Per patient his appetite is good.He is taking all medications as per ordered Patient stated that his legs hurt when he is standing long. Patient stated that since he had COVID he has episodes of loosing taste every once in a while. RN reiterated with patient to keep hydrated while working in his yard. Patient is still currently driving self and cooking own meals. Patient has agreed to further outreach calls.   Encounter Medications:  Outpatient Encounter Medications as of 07/30/2020  Medication Sig   apixaban (ELIQUIS) 5 MG TABS tablet Take 1 tablet (5 mg total) by mouth 2 (two) times daily.   atorvastatin (LIPITOR) 20 MG tablet TAKE 1 TABLET BY MOUTH ONCE DAILY AT BEDTIME.   carvedilol (COREG) 25 MG tablet TAKE 1 TABLET TWICE DAILY WITH A MEAL. (Patient taking differently: Take 25 mg by mouth in the morning and at bedtime.)   Cholecalciferol (VITAMIN D-3) 1000 UNITS CAPS Take 1,000 Units by mouth daily.    furosemide (LASIX) 40 MG tablet TAKE 1 TABLET BY MOUTH DAILY. HOLD DOSAGE IF WEIGHT < 183 POUNDS   indomethacin (INDOCIN SR) 75 MG CR capsule Take 75 mg by mouth daily as needed (gout attacks).   isosorbide mononitrate (IMDUR) 30 MG 24 hr tablet Take 1 tablet (30 mg total) by mouth daily.   losartan (COZAAR) 25 MG tablet TAKE 1/2 TABLET AT BEDTIME   omeprazole (PRILOSEC) 20 MG capsule TAKE 1 CAPSULE DAILY (Patient taking differently: Take 20 mg by mouth daily.)   phenylephrine (SUDAFED PE) 10 MG TABS tablet Take 10 mg by mouth every 6 (six) hours as needed (congestion).   spironolactone (ALDACTONE) 25 MG tablet TAKE 1/2 TABLET BY  MOUTH ONCE A DAY.   Tamsulosin HCl (FLOMAX) 0.4 MG CAPS Take 0.4 mg by mouth daily after breakfast.    No facility-administered encounter medications on file as of 07/30/2020.    Functional Status:  No flowsheet data found.  Fall/Depression Screening: Fall Risk  06/10/2019 02/19/2019 11/28/2018  Falls in the past year? 0 0 0  Number falls in past yr: 0 0 -  Injury with Fall? 0 0 -  Risk for fall due to : - - Impaired balance/gait  Follow up Falls evaluation completed;Falls prevention discussed Falls evaluation completed;Falls prevention discussed -   PHQ 2/9 Scores 09/10/2019 06/10/2019 03/06/2019 02/19/2019 02/05/2019 01/25/2019 12/07/2018  PHQ - 2 Score 0 0 0 0 0 0 0  PHQ- 9 Score - - - - - - -    Assessment:   Care Plan There are no care plans that you recently modified to display for this patient.    Goals Addressed             This Visit's Progress    (THN)Maintain My Quality of Life   On track    Timeframe:  Long-Range Goal Priority:  Medium Start Date: [id number]                            Expected End Date:  1202022  Follow Up Date [id number] - spend time outdoors at least 3 times a week    Why is this important?   Having a long-term illness can be scary.  It can also be stressful for you and your caregiver.  These steps may help.    Notes:  Patient loves mowing and taking care of yard.  He loves to do his own cooking and cooks for family to come and eat dinner Patient had [place] and once symptoms better resumed doing his own grocery shopping [id number] Patient is going to schedule his 2nd [place] booster [id number]  Patient is keeping active     [facility] and Keep All Appointments   On track    Timeframe:  Long-Range Goal Priority:  High Start Date:     [id number]                        Expected End Date:   [id number]                Follow Up Date [id number]   - call to cancel if needed - keep a calendar with prescription refill dates - keep a calendar with  appointment dates    Why is this important?   Part of staying healthy is seeing the doctor for follow-up care.  If you forget your appointments, there are some things you can do to stay on track.    Notes:  Patient is planning ahead on renewing application for Eliquis before the expiration date Patient needs to keep all cardiology appointments and ICD checks [phone] Patient is keeping all appointments     (THN)Track and Manage Heart Rate and Rhythm   On track    Timeframe:  Long-Range Goal Priority:  High Start Date: [id number]                            Expected End Date:    [id number]       Follow Up Date [id number] - make a plan to eat healthy - take medicine as prescribed    Why is this important?   Atrial fibrillation may have no symptoms. Sometimes the symptoms get worse or happen more often.  It is important to keep track of what your symptoms are and when they happen.  A change in symptoms is important to discuss with your doctor or nurse.  Being active and healthy eating will also help you manage your heart condition.     Notes: Remote checks q 3 months Follow up with All Cardiology appointments        Plan:  Follow-up: Patient agrees to Care Plan and Follow-up. Patient will monitor for symptoms of Afib Patient will follow up with routine pacemaker checks RN will follow up within the month of October RN sent update assessment to PCP  [facility] [phone]

## 2020-08-03 NOTE — Progress Notes (Signed)
Remote ICD transmission.   

## 2020-08-12 ENCOUNTER — Ambulatory Visit (INDEPENDENT_AMBULATORY_CARE_PROVIDER_SITE_OTHER): Payer: Medicare Other

## 2020-08-12 DIAGNOSIS — I428 Other cardiomyopathies: Secondary | ICD-10-CM

## 2020-08-13 LAB — CUP PACEART REMOTE DEVICE CHECK
Battery Remaining Longevity: 4 mo
Battery Remaining Percentage: 7 %
Battery Voltage: 2.65 V
Brady Statistic AP VP Percent: 94 %
Brady Statistic AP VS Percent: 1 %
Brady Statistic AS VP Percent: 5.1 %
Brady Statistic AS VS Percent: 1 %
Brady Statistic RA Percent Paced: 13 %
Date Time Interrogation Session: 20220810020017
HighPow Impedance: 45 Ohm
HighPow Impedance: 45 Ohm
Implantable Lead Implant Date: 20030319
Implantable Lead Implant Date: 20030319
Implantable Lead Implant Date: 20110211
Implantable Lead Location: 753858
Implantable Lead Location: 753859
Implantable Lead Location: 753860
Implantable Lead Model: 158
Implantable Lead Model: 4087
Implantable Lead Serial Number: 113412
Implantable Lead Serial Number: 159867
Implantable Pulse Generator Implant Date: 20160516
Lead Channel Impedance Value: 380 Ohm
Lead Channel Impedance Value: 410 Ohm
Lead Channel Impedance Value: 700 Ohm
Lead Channel Pacing Threshold Amplitude: 0.75 V
Lead Channel Pacing Threshold Amplitude: 1.25 V
Lead Channel Pacing Threshold Amplitude: 2.875 V
Lead Channel Pacing Threshold Pulse Width: 0.5 ms
Lead Channel Pacing Threshold Pulse Width: 1 ms
Lead Channel Pacing Threshold Pulse Width: 1 ms
Lead Channel Sensing Intrinsic Amplitude: 12 mV
Lead Channel Sensing Intrinsic Amplitude: 2.2 mV
Lead Channel Setting Pacing Amplitude: 2.5 V
Lead Channel Setting Pacing Amplitude: 3 V
Lead Channel Setting Pacing Amplitude: 3.375
Lead Channel Setting Pacing Pulse Width: 0.5 ms
Lead Channel Setting Pacing Pulse Width: 1 ms
Lead Channel Setting Sensing Sensitivity: 0.5 mV
Pulse Gen Serial Number: 7247398

## 2020-08-25 ENCOUNTER — Other Ambulatory Visit: Payer: Self-pay | Admitting: Internal Medicine

## 2020-08-26 DIAGNOSIS — C61 Malignant neoplasm of prostate: Secondary | ICD-10-CM | POA: Diagnosis not present

## 2020-08-26 DIAGNOSIS — N3289 Other specified disorders of bladder: Secondary | ICD-10-CM | POA: Diagnosis not present

## 2020-08-26 DIAGNOSIS — R351 Nocturia: Secondary | ICD-10-CM | POA: Diagnosis not present

## 2020-09-04 NOTE — Addendum Note (Signed)
Addended by: Cheri Murphey A on: 09/04/2020 08:48 AM   Modules accepted: Level of Service

## 2020-09-04 NOTE — Progress Notes (Signed)
Remote ICD transmission.   

## 2020-09-08 ENCOUNTER — Ambulatory Visit (INDEPENDENT_AMBULATORY_CARE_PROVIDER_SITE_OTHER): Payer: Medicare Other

## 2020-09-08 DIAGNOSIS — I428 Other cardiomyopathies: Secondary | ICD-10-CM

## 2020-09-08 LAB — CUP PACEART REMOTE DEVICE CHECK
Battery Remaining Longevity: 4 mo
Battery Remaining Percentage: 7 %
Battery Voltage: 2.65 V
Brady Statistic AP VP Percent: 94 %
Brady Statistic AP VS Percent: 1 %
Brady Statistic AS VP Percent: 5.1 %
Brady Statistic AS VS Percent: 1 %
Brady Statistic RA Percent Paced: 9.5 %
Date Time Interrogation Session: 20220906035739
HighPow Impedance: 44 Ohm
HighPow Impedance: 44 Ohm
Implantable Lead Implant Date: 20030319
Implantable Lead Implant Date: 20030319
Implantable Lead Implant Date: 20110211
Implantable Lead Location: 753858
Implantable Lead Location: 753859
Implantable Lead Location: 753860
Implantable Lead Model: 158
Implantable Lead Model: 4087
Implantable Lead Serial Number: 113412
Implantable Lead Serial Number: 159867
Implantable Pulse Generator Implant Date: 20160516
Lead Channel Impedance Value: 390 Ohm
Lead Channel Impedance Value: 480 Ohm
Lead Channel Impedance Value: 750 Ohm
Lead Channel Pacing Threshold Amplitude: 0.75 V
Lead Channel Pacing Threshold Amplitude: 1.25 V
Lead Channel Pacing Threshold Amplitude: 2.375 V
Lead Channel Pacing Threshold Pulse Width: 0.5 ms
Lead Channel Pacing Threshold Pulse Width: 1 ms
Lead Channel Pacing Threshold Pulse Width: 1 ms
Lead Channel Sensing Intrinsic Amplitude: 2.2 mV
Lead Channel Sensing Intrinsic Amplitude: 9.2 mV
Lead Channel Setting Pacing Amplitude: 2.5 V
Lead Channel Setting Pacing Amplitude: 2.875
Lead Channel Setting Pacing Amplitude: 3 V
Lead Channel Setting Pacing Pulse Width: 0.5 ms
Lead Channel Setting Pacing Pulse Width: 1 ms
Lead Channel Setting Sensing Sensitivity: 0.5 mV
Pulse Gen Serial Number: 7247398

## 2020-09-17 NOTE — Progress Notes (Signed)
Remote ICD transmission.   

## 2020-09-25 ENCOUNTER — Other Ambulatory Visit: Payer: Self-pay

## 2020-09-25 ENCOUNTER — Ambulatory Visit (INDEPENDENT_AMBULATORY_CARE_PROVIDER_SITE_OTHER): Payer: Medicare Other | Admitting: Internal Medicine

## 2020-09-25 VITALS — BP 106/62 | HR 79 | Ht 67.0 in | Wt 188.0 lb

## 2020-09-25 DIAGNOSIS — I5042 Chronic combined systolic (congestive) and diastolic (congestive) heart failure: Secondary | ICD-10-CM

## 2020-09-25 DIAGNOSIS — Z95810 Presence of automatic (implantable) cardiac defibrillator: Secondary | ICD-10-CM | POA: Diagnosis not present

## 2020-09-25 DIAGNOSIS — I472 Ventricular tachycardia: Secondary | ICD-10-CM

## 2020-09-25 DIAGNOSIS — I4729 Other ventricular tachycardia: Secondary | ICD-10-CM

## 2020-09-25 DIAGNOSIS — I2510 Atherosclerotic heart disease of native coronary artery without angina pectoris: Secondary | ICD-10-CM | POA: Diagnosis not present

## 2020-09-25 NOTE — Progress Notes (Signed)
HPI [name] returns today for followup. He is a pleasant 87 yo man with a h/o chronic systolic heart failure, dyslipidemia, VT, s/p ICD insertion. He has developed atrial fib, was cardioverted and had ERAF despite being on amiodarone. He denies medical non-compliance. He has not had chest pain. He has class 3 CHF symptoms. No ICD shocks.  No Known Allergies   Current Outpatient Medications  Medication Sig Dispense Refill   apixaban (ELIQUIS) 5 MG TABS tablet Take 1 tablet (5 mg total) by mouth 2 (two) times daily. 60 tablet 11   atorvastatin (LIPITOR) 20 MG tablet TAKE 1 TABLET BY MOUTH ONCE DAILY AT BEDTIME. 90 tablet 3   carvedilol (COREG) 25 MG tablet TAKE 1 TABLET TWICE DAILY WITH A MEAL. 180 tablet 2   Cholecalciferol (VITAMIN D-3) 1000 UNITS CAPS Take 1,000 Units by mouth daily.      furosemide (LASIX) 40 MG tablet TAKE 1 TABLET BY MOUTH DAILY. HOLD DOSAGE IF WEIGHT < 183 POUNDS 90 tablet 2   indomethacin (INDOCIN SR) 75 MG CR capsule Take 75 mg by mouth daily as needed (gout attacks).     isosorbide mononitrate (IMDUR) 30 MG 24 hr tablet Take 1 tablet (30 mg total) by mouth daily. 90 tablet 3   losartan (COZAAR) 25 MG tablet TAKE 1/2 TABLET AT BEDTIME 45 tablet 3   omeprazole (PRILOSEC) 20 MG capsule TAKE 1 CAPSULE DAILY (Patient taking differently: Take 20 mg by mouth daily.) 90 capsule 2   phenylephrine (SUDAFED PE) 10 MG TABS tablet Take 10 mg by mouth every 6 (six) hours as needed (congestion).     spironolactone (ALDACTONE) 25 MG tablet TAKE 1/2 TABLET BY MOUTH ONCE A DAY. 45 tablet 3   Tamsulosin HCl (FLOMAX) 0.4 MG CAPS Take 0.4 mg by mouth daily after breakfast.      No current facility-administered medications for this visit.     Past Medical History:  Diagnosis Date   CAD (coronary artery disease)    CHF (congestive heart failure) ([facility])    Dyslipidemia    History of ventricular fibrillation    HTN (hypertension)    Hx-sudden cardiac arrest    Ischemic  cardiomyopathy    LBBB (left bundle branch block)    Ventricular tachycardia ([place])     ROS:   All systems reviewed and negative except as noted in the HPI.   Past Surgical History:  Procedure Laterality Date   CARDIAC CATHETERIZATION  10/12/2008   CARDIAC DEFIBRILLATOR PLACEMENT     St Jude   CARDIOVERSION N/A 06/28/2019   Procedure: CARDIOVERSION;  Surgeon: Mark Perla, MD;  [facility] ENDOSCOPY;  Service: Cardiovascular;  Laterality: N/A;   CARDIOVERSION N/A 02/14/2020   Procedure: CARDIOVERSION;  Surgeon: Mark Klein, MD;  Location: [place] ENDOSCOPY;  Service: Cardiovascular;  Laterality: N/A;   DOPPLER ECHOCARDIOGRAPHY  2008, 2011   EP IMPLANTABLE DEVICE N/A 05/19/2014   Procedure: ICD/BIV ICD Generator Changeout;  Surgeon: Mark Lance, MD;  Location: [place] CV LAB;  Service: Cardiovascular;  Laterality: N/A;   TONSILECTOMY, ADENOIDECTOMY, BILATERAL MYRINGOTOMY AND TUBES       Family History  Problem Relation Age of Onset   Coronary artery disease Other      Social History   Socioeconomic History   Marital status: Divorced    Spouse name: Not on file   Number of children: Not on file   Years of education: Not on file   Highest education level: Not on  file  Occupational History   Occupation: furniture truck driver  Tobacco Use   Smoking status: Former    Types: Cigarettes    Quit date: 01/03/1985    Years since quitting: 35.7   Smokeless tobacco: Never  Vaping Use   Vaping Use: Never used  Substance and Sexual Activity   Alcohol use: Never   Drug use: Never   Sexual activity: Not on file  Other Topics Concern   Not on file  Social History Narrative   Not on file   Social Determinants of Health   Financial Resource Strain: Not on file  Food Insecurity: No Food Insecurity   Worried About Charity fundraiser in the Last Year: Never true   Ran Out of Food in the Last Year: Never true  Transportation Needs: Not on file  Physical Activity: Not  on file  Stress: Not on file  Social Connections: Not on file  Intimate Partner Violence: Not on file     BP 106/62   Pulse 79   Ht 5\' 7"  (1.702 m)   Wt 188 lb (85.3 kg)   SpO2 98%   BMI 29.44 kg/m   Physical Exam:  Well appearing NAD HEENT: Unremarkable Neck:  7 cm JVD, no thyromegally Lymphatics:  No adenopathy Back:  No CVA tenderness Lungs:  Clear with scattered basilar rales HEART:  IRegular rate rhythm, no murmurs, no rubs, no clicks Abd:  soft, positive bowel sounds, no organomegally, no rebound, no guarding Ext:  2 plus pulses, no edema, no cyanosis, no clubbing Skin:  No rashes no nodules Neuro:  CN II through XII intact, motor grossly intact  EKG - atrial fib with ventricular pacing  DEVICE  Normal device function.  See PaceArt for details. Approaching ERI  Assess/Plan:  Atrial fib - his VR is not well controlled and he is pacing 70% of the time. I have recommended AV node ablation when he returns for device change out. Chronic systolic heart failure - he has class 3 symptoms and his QRS is quite wide. I would recommend attempting to add a left bundle area lead when he returns for gen change if his vein is open. ICD - his device is approaching ERI. I will plan to change out in a little over 2 months. VT - he is s/p ablation and has not had more VT since his ablation. We will follow. Mark Nguyen Mark Leidy,MD

## 2020-09-25 NOTE — Patient Instructions (Addendum)
Medication Instructions:  Your physician recommends that you continue on your current medications as directed. Please refer to the Current Medication list given to you today.  Labwork: None ordered.  Testing/Procedures: You will be scheduled for an AV node ablation with generator change on December 25, 2020.  You will arrive at the hospital at 5:30 am.  I will meet with you on December 10, 2020 to go over additional instructions.  Follow-Up: Your physician wants you to follow-up in:  December 8 at 8:00 am at the [facility] office for a nurse visit   Remote monitoring is used to monitor your ICD from home. This monitoring reduces the number of office visits required to check your device to one time per year. It allows [name] to keep an eye on the functioning of your device to ensure it is working properly. You are scheduled for a device check from home on 10/12/2020. You may send your transmission at any time that day. If you have a wireless device, the transmission will be sent automatically. After your physician reviews your transmission, you will receive a postcard with your next transmission date.  Any Other Special Instructions Will Be Listed Below (If Applicable).  If you need a refill on your cardiac medications before your next appointment, please call your pharmacy.

## 2020-10-05 ENCOUNTER — Encounter: Payer: Self-pay | Admitting: *Deleted

## 2020-10-05 NOTE — Progress Notes (Unsigned)

## 2020-10-12 ENCOUNTER — Ambulatory Visit (INDEPENDENT_AMBULATORY_CARE_PROVIDER_SITE_OTHER): Payer: Medicare Other

## 2020-10-12 DIAGNOSIS — Z23 Encounter for immunization: Secondary | ICD-10-CM | POA: Diagnosis not present

## 2020-10-12 DIAGNOSIS — I428 Other cardiomyopathies: Secondary | ICD-10-CM

## 2020-10-13 ENCOUNTER — Encounter: Payer: Self-pay | Admitting: *Deleted

## 2020-10-13 NOTE — Progress Notes (Unsigned)

## 2020-10-15 LAB — CUP PACEART REMOTE DEVICE CHECK
Battery Remaining Longevity: 2 mo
Battery Remaining Percentage: 4 %
Battery Voltage: 2.63 V
Brady Statistic AP VP Percent: 0 %
Brady Statistic AP VS Percent: 0 %
Brady Statistic AS VP Percent: 0 %
Brady Statistic AS VS Percent: 0 %
Brady Statistic RA Percent Paced: 1 %
Date Time Interrogation Session: 20221010020016
HighPow Impedance: 39 Ohm
HighPow Impedance: 39 Ohm
Implantable Lead Implant Date: 20030319
Implantable Lead Implant Date: 20030319
Implantable Lead Implant Date: 20110211
Implantable Lead Location: 753858
Implantable Lead Location: 753859
Implantable Lead Location: 753860
Implantable Lead Model: 158
Implantable Lead Model: 4087
Implantable Lead Serial Number: 113412
Implantable Lead Serial Number: 159867
Implantable Pulse Generator Implant Date: 20160516
Lead Channel Impedance Value: 360 Ohm
Lead Channel Impedance Value: 460 Ohm
Lead Channel Impedance Value: 700 Ohm
Lead Channel Pacing Threshold Amplitude: 0.75 V
Lead Channel Pacing Threshold Amplitude: 1.25 V
Lead Channel Pacing Threshold Amplitude: 2.5 V
Lead Channel Pacing Threshold Pulse Width: 0.5 ms
Lead Channel Pacing Threshold Pulse Width: 1 ms
Lead Channel Pacing Threshold Pulse Width: 1 ms
Lead Channel Sensing Intrinsic Amplitude: 0.5 mV
Lead Channel Sensing Intrinsic Amplitude: 12 mV
Lead Channel Setting Pacing Amplitude: 2.5 V
Lead Channel Setting Pacing Amplitude: 3 V
Lead Channel Setting Pacing Amplitude: 3 V
Lead Channel Setting Pacing Pulse Width: 0.5 ms
Lead Channel Setting Pacing Pulse Width: 1 ms
Lead Channel Setting Sensing Sensitivity: 0.5 mV
Pulse Gen Serial Number: 7247398

## 2020-10-19 ENCOUNTER — Encounter: Payer: Self-pay | Admitting: Student

## 2020-10-19 NOTE — Progress Notes (Unsigned)
Things That May Be Affecting Your Health:  Alcohol  Hearing loss  Pain    Depression  Home Safety  Sexual Health   Diabetes  Lack of physical activity  Stress  X Difficulty with daily activities X Loneliness  Tiredness   Drug use X Medicines  Tobacco use   Falls  Motor Vehicle Safety  Weight   Food choices  Oral Health  Other    YOUR PERSONALIZED HEALTH PLAN : 1. Schedule your next subsequent Medicare Wellness visit in one year 2. Attend all of your regular appointments to address your medical issues 3. Complete the preventative screenings and services   Annual Wellness Visit   Medicare Covered Preventative Screenings and [place] Men and Women Who How Often Need? Date of Last Service Action  Abdominal Aortic Aneurysm Adults with AAA risk factors Once      Alcohol Misuse and Counseling All Adults Screening once a year if no alcohol misuse. Counseling up to 4 face to face sessions.     Bone Density Measurement  Adults at risk for osteoporosis Once every 2 yrs      Lipid Panel Z13.6 All adults without CV disease Once every 5 yrs       Colorectal Cancer  Stool sample or Colonoscopy All adults 66 and older  Once every year Every 10 years        Depression All Adults Once a year X Today   Diabetes Screening Blood glucose, post glucose load, or GTT Z13.1 All adults at risk Pre-diabetics Once per year Twice per year X  11/22/2018 A1c was 5.6%   Diabetes  Self-Management Training All adults Diabetics 10 hrs first year; 2 hours subsequent years. Requires Copay     Glaucoma Diabetics Family history of glaucoma African Americans 51 yrs + Hispanic Americans 85 yrs + Annually - requires coppay      Hepatitis C Z72.89 or F19.20 High Risk for HCV Born between 1945 and 1965 Annually Once      HIV Z11.4 All adults based on risk Annually btw ages 9 & 65 regardless of risk Annually > 65 yrs if at increased risk      Lung Cancer Screening Asymptomatic  adults aged 39-77 with 30 pack yr history and current smoker OR quit within the last 15 yrs Annually Must have counseling and shared decision making documentation before first screen      Medical Nutrition Therapy Adults with  Diabetes Renal disease Kidney transplant within past 3 yrs 3 hours first year; 2 hours subsequent years     Obesity and Counseling All adults Screening once a year Counseling if BMI 30 or higher  Today   Tobacco Use Counseling Adults who use tobacco  Up to 8 visits in one year     Vaccines Z23 Hepatitis B Influenza  Pneumonia  Adults  Once Once every flu season Two different vaccines separated by one year X  Ensure he has all his flu shot and all his covid shots  Next Annual Wellness Visit People with Medicare Every year  Today     Services & Screenings Women Who How Often Need  Date of Last Service Action  Mammogram  Z12.31 Women over 68 One baseline ages 24-39. Annually ager 40 yrs+      Pap tests All women Annually if high risk. Every 2 yrs for normal risk women      Screening for cervical cancer with  Pap (Z01.419 nl or Z01.411abnl) & HPV  Z68.51 Women aged 4 to 18 Once every 5 yrs     Screening pelvic and breast exams All women Annually if high risk. Every 2 yrs for normal risk women     Sexually Transmitted Diseases Chlamydia Gonorrhea Syphilis All at risk adults Annually for non pregnant females at increased risk         [place] Men Who How Ofter Need  Date of Last Service Action  Prostate Cancer - DRE & PSA Men over 50 Annually.  DRE might require a copay.        Sexually Transmitted Diseases Syphilis All at risk adults Annually for men at increased risk      Health Maintenance List Health Maintenance  Topic Date Due   TETANUS/TDAP  Never done   Zoster Vaccines- Shingrix (1 of 2) Never done   COVID-19 Vaccine (3 - Pfizer risk series) 03/06/2019   INFLUENZA VACCINE  Completed   HPV VACCINES  Aged Out

## 2020-10-21 NOTE — Addendum Note (Signed)
Addended by: Cheri Sweeney A on: 10/21/2020 10:59 AM   Modules accepted: Level of Service

## 2020-10-21 NOTE — Progress Notes (Signed)
Remote ICD transmission.   

## 2020-10-27 ENCOUNTER — Other Ambulatory Visit: Payer: Self-pay | Admitting: *Deleted

## 2020-10-27 ENCOUNTER — Other Ambulatory Visit: Payer: Self-pay | Admitting: Internal Medicine

## 2020-10-27 NOTE — Patient Outreach (Signed)
[facility]) Care Management  [name]  10/27/2020   Mark Nguyen 1931-12-05 [id number]  RN Health Coach telephone call to patient.  [place] compliance verified. Per patient he is feeling fatigue and short of breath on minimal exertion. Patient stated that Afib is not staying in rhythm. He doesn't feel like doing anything now. He has scheduled in  December for his defibrill and pacemaker serviced and ablation. His appetite is good on low sodium diet. Patient.stated he sleeps poorly due to having to get up every two hours to void due to diuretic. Patient has agreed to follow up outreach calls.   Encounter Medications:  Outpatient Encounter Medications as of 10/27/2020  Medication Sig   apixaban (ELIQUIS) 5 MG TABS tablet Take 1 tablet (5 mg total) by mouth 2 (two) times daily.   atorvastatin (LIPITOR) 20 MG tablet TAKE 1 TABLET BY MOUTH ONCE DAILY AT BEDTIME.   carvedilol (COREG) 25 MG tablet TAKE 1 TABLET TWICE DAILY WITH A MEAL.   Cholecalciferol (VITAMIN D-3) 1000 UNITS CAPS Take 1,000 Units by mouth daily.    furosemide (LASIX) 40 MG tablet TAKE 1 TABLET BY MOUTH DAILY. HOLD DOSAGE IF WEIGHT < 183 POUNDS   indomethacin (INDOCIN SR) 75 MG CR capsule Take 75 mg by mouth daily as needed (gout attacks).   isosorbide mononitrate (IMDUR) 30 MG 24 hr tablet Take 1 tablet (30 mg total) by mouth daily.   losartan (COZAAR) 25 MG tablet TAKE 1/2 TABLET AT BEDTIME   omeprazole (PRILOSEC) 20 MG capsule TAKE 1 CAPSULE DAILY (Patient taking differently: Take 20 mg by mouth daily.)   phenylephrine (SUDAFED PE) 10 MG TABS tablet Take 10 mg by mouth every 6 (six) hours as needed (congestion).   spironolactone (ALDACTONE) 25 MG tablet TAKE 1/2 TABLET BY MOUTH ONCE A DAY.   Tamsulosin HCl (FLOMAX) 0.4 MG CAPS Take 0.4 mg by mouth daily after breakfast.    No facility-administered encounter medications on file as of 10/27/2020.    Functional Status:  No flowsheet data  found.  Fall/Depression Screening: Fall Risk  06/10/2019 02/19/2019 11/28/2018  Falls in the past year? 0 0 0  Number falls in past yr: 0 0 -  Injury with Fall? 0 0 -  Risk for fall due to : - - Impaired balance/gait  Follow up Falls evaluation completed;Falls prevention discussed Falls evaluation completed;Falls prevention discussed -   PHQ 2/9 Scores 10/27/2020 09/10/2019 06/10/2019 03/06/2019 02/19/2019 02/05/2019 01/25/2019  PHQ - 2 Score 0 0 0 0 0 0 0  PHQ- 9 Score - - - - - - -    Assessment:   Care Plan Care Plan : Atrial Fibrillation (Adult)  Updates made by Verlin Grills, RN since 10/27/2020 12:00 AM     Problem: Dysrhythmia (Atrial Fibrillation)   Priority: High  Onset Date: 01/22/2020     Long-Range Goal: Heart Rate and Rhythm Monitored and Managed   Start Date: 10/09/2019  Expected End Date: 01/01/2022  This Visit's Progress: On track  Recent Progress: On track  Priority: High  Note:   Evidence-based guidance:  Assess heart rate, rhythm and presence of symptoms at each encounter using pulse palpation, blood pressure monitor and review of symptom diary.  Consider pulse check plus an electrocardiogram (ECG) in women over the age of 26 years.  Prepare patient for diagnostic studies, such as 24-hour ambulatory monitoring, echocardiogram or implantable loop recorder, based on presentation and risk factors, such as cryptogenic stroke.  Prepare patient  for use of pharmacologic therapy, such as beta-blocker, calcium channel blocker, digoxin, antiarrhythmic, based on presentation, risk factors and patient preferences.  Monitor side effects and expect periodic adjustments.  Provide an opportunity for shared decision-making when uncontrolled rate and rhythm persist and invasive therapy is considered, such as left atrial ablation, pacemaker, cardioversion or cardiac resynchronization therapy.  Provide reassurance, as initial symptoms and potential recurrence are frightening to patient  and family/caregiver and may impact quality of life.  Provide prompt follow-up after hospitalization to support transition of care; consider referral to home care or community support program, especially in presence of comorbidity.  Encourage exercise 2 to 3 times per week, based on ability and tolerance, to improve physical and cardiac function and quality of life.   Notes:  [id number] Patient is being monitored regularly and keeping all appointments [id number] Patient has bee having his Afib monitored and it has not been in rhythm. Per patient he is having an ablation done in December      Task: Alleviate Barriers to Dysrhythmia Management   Due Date: 01/01/2022  Note:   Care Management Activities:    - activity or exercise based on tolerance encouraged - medication-adherence assessment completed - reassurance provided    Notes: [id number] Patient is resting frequently as he gets tired. He is drinking plenty of fluids when working outside    Problem: Stroke Risk (Atrial Fibrillation)   Priority: Medium  Onset Date: 01/22/2020     Long-Range Goal: Stroke Risk Minimized   Start Date: 10/09/2019  Expected End Date: 01/01/2022  Recent Progress: On track  Priority: Medium  Note:   Evidence-based guidance:  Provide anticipatory guidance regarding the signs/symptoms of stroke and heart failure.  Assess bleeding risk using a validated tool; review results with patient.  Assess and review stroke risk using a validated tool that include age and sex differences to determine choice, benefit and timing of anticoagulant therapy.  Prepare patient for use of pharmacologic therapy, such as nonvitamin K oral anticoagulant, vitamin K antagonist, low-molecular-weight heparin or antiplatelet (in combination with another agent) based on stroke risk score.  Monitor side effects and anticipate need for periodic adjustments.  Prepare patient for periodic coagulation laboratory testing when taking vitamin K  antagonist; consider at-home monitoring and management.  Review alcohol risk screen or self-report of alcohol use (amount and frequency) when anticoagulant (especially vitamin K antagonist) is prescribed.  Encourage consistent consumption, not restriction, of dietary vitamin K, such as green, leafy vegetables, during warfarin therapy and especially during initiation phase.  Provide anticipatory guidance regarding the risk of bleeding, awareness of signs/symptoms of bleeding and minimizing bleeding risk, including fall and injury prevention.  Perform medication-adherence assessment when taking oral anticoagulants that do not require periodic monitoring or when poor anticoagulation control based on monitoring is noted.  Address barriers to treatment adherence, such as cognitive function, illness, drug interaction, medication cost, presence of depression or anxiety and lifestyle factors, including diet and alcohol consumption.  Promote shared decision-making using stroke and treatment benefits and risks, lifestyle and patient preferences to determine type of antithrombotic therapy.   Notes:  Provided picture sheet of F.A.S.T. acronym for symptoms of stroke [id number] Patient is aware of the signs and symptoms of stroke.     Task: Minimize Stroke Risk   Due Date: 01/01/2022  Note:   Care Management Activities:    - fall and injury prevention strategies promoted - importance of rapid response to stroke symptoms reviewed - stroke risk screen reviewed - stroke  signs and symptoms reviewed    Notes:  [id number] Patient sent sheet of F.A.S.T. to put on refrigerator      Goals Addressed             This Visit's Progress    (THN)Maintain My Quality of Life   Not on track    Timeframe:  Long-Range Goal Priority:  Medium Start Date: [id number]                            Expected End Date:  [id number]         Follow Up Date [id number] - spend time outdoors at least 3 times a week    Why is this  important?   Having a long-term illness can be scary.  It can also be stressful for you and your caregiver.  These steps may help.    Notes:  Patient loves mowing and taking care of yard.  He loves to do his own cooking and cooks for family to come and eat dinner Patient had [place] and once symptoms better resumed doing his own grocery shopping [id number] Patient is going to schedule his 2nd [place] booster [id number]  Patient is keeping active [id number] Per patient he is feeling fatigue and shortness of breath on minimal exertion. Per patient Per patient his atrial fib ins not staying in rhythm.      [facility] and Keep All Appointments   On track    Timeframe:  Long-Range Goal Priority:  High Start Date:     [id number]                        Expected End Date:   [id number]           Follow Up Date 67209470  - call to cancel if needed - keep a calendar with prescription refill dates - keep a calendar with appointment dates    Why is this important?   Part of staying healthy is seeing the doctor for follow-up care.  If you forget your appointments, there are some things you can do to stay on track.    Notes:  Patient is planning ahead on renewing application for Eliquis before the expiration date Patient needs to keep all cardiology appointments and ICD checks [phone] Patient is keeping all appointments [id number] Patient has made appointments for follow up care of defib, pacemaker and ablation.     (THN)Track and Manage Heart Rate and Rhythm   On track    Timeframe:  Long-Range Goal Priority:  High Start Date: [id number]                            Expected End Date:    03546568 Follow Up Date 12751700 - make a plan to eat healthy - take medicine as prescribed    Why is this important?   Atrial fibrillation may have no symptoms. Sometimes the symptoms get worse or happen more often.  It is important to keep track of what your symptoms are and when they happen.  A change in symptoms is  important to discuss with your doctor or nurse.  Being active and healthy eating will also help you manage your heart condition.     Notes: Remote checks q 3 months Follow up with All Cardiology appointments [id number] Patient has been following up with heart rhythm. Per patient his Afib has not been in  rhythm. He stated he will have an ablation done in December.         Plan:  Follow-up: Follow-up in 3 month(s) Patient has scheduled ablation in December Patient has defib and pacemaker service in December Rn sent update assessment to PCP  [place] [facility] [phone]

## 2020-10-27 NOTE — Patient Instructions (Signed)
Goals Addressed             This Visit's Progress    (THN)Maintain My Quality of Life   Not on track    Timeframe:  Long-Range Goal Priority:  Medium Start Date: 09381829                            Expected End Date:  93716967         Follow Up Date 89381017 - spend time outdoors at least 3 times a week    Why is this important?   Having a long-term illness can be scary.  It can also be stressful for you and your caregiver.  These steps may help.    Notes:  Patient loves mowing and taking care of yard.  He loves to do his own cooking and cooks for family to come and eat dinner Patient had [place] and once symptoms better resumed doing his own grocery shopping [id number] Patient is going to schedule his 2nd [place] booster [id number]  Patient is keeping active [id number] Per patient he is feeling fatigue and shortness of breath on minimal exertion. Per patient Per patient his atrial fib ins not staying in rhythm.      [facility] and Keep All Appointments   On track    Timeframe:  Long-Range Goal Priority:  High Start Date:     40086761                        Expected End Date:   95093267           Follow Up Date 12458099  - call to cancel if needed - keep a calendar with prescription refill dates - keep a calendar with appointment dates    Why is this important?   Part of staying healthy is seeing the doctor for follow-up care.  If you forget your appointments, there are some things you can do to stay on track.    Notes:  Patient is planning ahead on renewing application for Eliquis before the expiration date Patient needs to keep all cardiology appointments and ICD checks [phone] Patient is keeping all appointments [id number] Patient has made appointments for follow up care of defib, pacemaker and ablation.     (THN)Track and Manage Heart Rate and Rhythm   On track    Timeframe:  Long-Range Goal Priority:  High Start Date: 39767341                            Expected End  Date:    93790240 Follow Up Date 97353299 - make a plan to eat healthy - take medicine as prescribed    Why is this important?   Atrial fibrillation may have no symptoms. Sometimes the symptoms get worse or happen more often.  It is important to keep track of what your symptoms are and when they happen.  A change in symptoms is important to discuss with your doctor or nurse.  Being active and healthy eating will also help you manage your heart condition.     Notes: Remote checks q 3 months Follow up with All Cardiology appointments [id number] Patient has been following up with heart rhythm. Per patient his Afib has not been in rhythm. He stated he will have an ablation done in December.

## 2020-11-16 ENCOUNTER — Ambulatory Visit (INDEPENDENT_AMBULATORY_CARE_PROVIDER_SITE_OTHER): Payer: Medicare Other

## 2020-11-16 DIAGNOSIS — I428 Other cardiomyopathies: Secondary | ICD-10-CM

## 2020-11-16 LAB — CUP PACEART REMOTE DEVICE CHECK
Battery Remaining Longevity: 2 mo
Battery Remaining Percentage: 4 %
Battery Voltage: 2.62 V
Brady Statistic AP VP Percent: 0 %
Brady Statistic AP VS Percent: 0 %
Brady Statistic AS VP Percent: 0 %
Brady Statistic AS VS Percent: 0 %
Brady Statistic RA Percent Paced: 1 %
Date Time Interrogation Session: 20221114032520
HighPow Impedance: 41 Ohm
HighPow Impedance: 41 Ohm
Implantable Lead Implant Date: 20030319
Implantable Lead Implant Date: 20030319
Implantable Lead Implant Date: 20110211
Implantable Lead Location: 753858
Implantable Lead Location: 753859
Implantable Lead Location: 753860
Implantable Lead Model: 158
Implantable Lead Model: 4087
Implantable Lead Serial Number: 113412
Implantable Lead Serial Number: 159867
Implantable Pulse Generator Implant Date: 20160516
Lead Channel Impedance Value: 360 Ohm
Lead Channel Impedance Value: 430 Ohm
Lead Channel Impedance Value: 650 Ohm
Lead Channel Pacing Threshold Amplitude: 0.75 V
Lead Channel Pacing Threshold Amplitude: 1.25 V
Lead Channel Pacing Threshold Amplitude: 2.125 V
Lead Channel Pacing Threshold Pulse Width: 0.5 ms
Lead Channel Pacing Threshold Pulse Width: 1 ms
Lead Channel Pacing Threshold Pulse Width: 1 ms
Lead Channel Sensing Intrinsic Amplitude: 0.5 mV
Lead Channel Sensing Intrinsic Amplitude: 12 mV
Lead Channel Setting Pacing Amplitude: 2.5 V
Lead Channel Setting Pacing Amplitude: 2.625
Lead Channel Setting Pacing Amplitude: 3 V
Lead Channel Setting Pacing Pulse Width: 0.5 ms
Lead Channel Setting Pacing Pulse Width: 1 ms
Lead Channel Setting Sensing Sensitivity: 0.5 mV
Pulse Gen Serial Number: 7247398

## 2020-11-24 NOTE — Progress Notes (Signed)
Remote ICD transmission.   

## 2020-12-03 ENCOUNTER — Telehealth: Payer: Self-pay

## 2020-12-03 NOTE — Telephone Encounter (Signed)
Scheduled with cath lab- AV node ablation with downgrade from BIV ICD to BIV PPM with attempted left bundle area lead placement.  Work up complete.  Will meet with Pt December 8 to get lab work and go over Radiation protection practitioner and give soap.

## 2020-12-08 ENCOUNTER — Ambulatory Visit (INDEPENDENT_AMBULATORY_CARE_PROVIDER_SITE_OTHER): Payer: Medicare Other

## 2020-12-08 DIAGNOSIS — I428 Other cardiomyopathies: Secondary | ICD-10-CM | POA: Diagnosis not present

## 2020-12-08 LAB — CUP PACEART REMOTE DEVICE CHECK
Battery Remaining Longevity: 2 mo
Battery Remaining Percentage: 4 %
Battery Voltage: 2.62 V
Brady Statistic AP VP Percent: 0 %
Brady Statistic AP VS Percent: 0 %
Brady Statistic AS VP Percent: 0 %
Brady Statistic AS VS Percent: 0 %
Brady Statistic RA Percent Paced: 1 %
Date Time Interrogation Session: 20221206033259
HighPow Impedance: 39 Ohm
HighPow Impedance: 39 Ohm
Implantable Lead Implant Date: 20030319
Implantable Lead Implant Date: 20030319
Implantable Lead Implant Date: 20110211
Implantable Lead Location: 753858
Implantable Lead Location: 753859
Implantable Lead Location: 753860
Implantable Lead Model: 158
Implantable Lead Model: 4087
Implantable Lead Serial Number: 113412
Implantable Lead Serial Number: 159867
Implantable Pulse Generator Implant Date: 20160516
Lead Channel Impedance Value: 360 Ohm
Lead Channel Impedance Value: 390 Ohm
Lead Channel Impedance Value: 650 Ohm
Lead Channel Pacing Threshold Amplitude: 0.75 V
Lead Channel Pacing Threshold Amplitude: 1.25 V
Lead Channel Pacing Threshold Amplitude: 2.625 V
Lead Channel Pacing Threshold Pulse Width: 0.5 ms
Lead Channel Pacing Threshold Pulse Width: 1 ms
Lead Channel Pacing Threshold Pulse Width: 1 ms
Lead Channel Sensing Intrinsic Amplitude: 0.5 mV
Lead Channel Sensing Intrinsic Amplitude: 12 mV
Lead Channel Setting Pacing Amplitude: 2.5 V
Lead Channel Setting Pacing Amplitude: 3 V
Lead Channel Setting Pacing Amplitude: 3.125
Lead Channel Setting Pacing Pulse Width: 0.5 ms
Lead Channel Setting Pacing Pulse Width: 1 ms
Lead Channel Setting Sensing Sensitivity: 0.5 mV
Pulse Gen Serial Number: 7247398

## 2020-12-10 ENCOUNTER — Other Ambulatory Visit: Payer: Self-pay

## 2020-12-10 ENCOUNTER — Ambulatory Visit: Payer: Medicare Other

## 2020-12-10 DIAGNOSIS — I428 Other cardiomyopathies: Secondary | ICD-10-CM

## 2020-12-10 LAB — CBC WITH DIFFERENTIAL/PLATELET
Basophils Absolute: 0 10*3/uL (ref 0.0–0.2)
Basos: 0 %
EOS (ABSOLUTE): 0.2 10*3/uL (ref 0.0–0.4)
Eos: 2 %
Hematocrit: 32.1 % — ABNORMAL LOW (ref 37.5–51.0)
Hemoglobin: 10.1 g/dL — ABNORMAL LOW (ref 13.0–17.7)
Lymphocytes Absolute: 1 10*3/uL (ref 0.7–3.1)
Lymphs: 14 %
MCH: 26.2 pg — ABNORMAL LOW (ref 26.6–33.0)
MCHC: 31.5 g/dL (ref 31.5–35.7)
MCV: 83 fL (ref 79–97)
Monocytes Absolute: 0.7 10*3/uL (ref 0.1–0.9)
Monocytes: 9 %
Neutrophils Absolute: 5.1 10*3/uL (ref 1.4–7.0)
Neutrophils: 75 %
Platelets: 137 10*3/uL — ABNORMAL LOW (ref 150–450)
RBC: 3.86 x10E6/uL — ABNORMAL LOW (ref 4.14–5.80)
RDW: 16.3 % — ABNORMAL HIGH (ref 11.6–15.4)
WBC: 6.9 10*3/uL (ref 3.4–10.8)

## 2020-12-10 LAB — BASIC METABOLIC PANEL
BUN/Creatinine Ratio: 13 (ref 10–24)
BUN: 17 mg/dL (ref 8–27)
CO2: 29 mmol/L (ref 20–29)
Calcium: 8.9 mg/dL (ref 8.6–10.2)
Chloride: 104 mmol/L (ref 96–106)
Creatinine, Ser: 1.26 mg/dL (ref 0.76–1.27)
Glucose: 131 mg/dL — ABNORMAL HIGH (ref 70–99)
Potassium: 3.5 mmol/L (ref 3.5–5.2)
Sodium: 144 mmol/L (ref 134–144)
eGFR: 55 mL/min/{1.73_m2} — ABNORMAL LOW (ref >59)

## 2020-12-10 NOTE — Patient Instructions (Signed)
See instruction letter

## 2020-12-10 NOTE — Progress Notes (Signed)
Reason for visit: Labs and instruction for upcoming procedure  Name of MD requesting visit: Dr. Lovena Le  H&P: Pt with ICD ERI and Afib with RVR.  Plan to change out BIV ICD-attempt lead insertion-AV node ablation  ROS related to problem: Pt advised of pre procedure instructions.  CHG soap given and instructions given.  Labs acquired.  Assessment and plan per MD: Procedure scheduled for December 25, 2020

## 2020-12-17 NOTE — Progress Notes (Signed)
Remote ICD transmission.   

## 2020-12-24 NOTE — Pre-Procedure Instructions (Signed)
Instructed patient on the following items: Arrival time 0630 Nothing to eat or drink after midnight No meds AM of procedure Responsible person to drive you home and stay with you for 24 hrs     

## 2020-12-25 ENCOUNTER — Encounter (HOSPITAL_COMMUNITY): Payer: Self-pay | Admitting: Internal Medicine

## 2020-12-25 ENCOUNTER — Ambulatory Visit (HOSPITAL_COMMUNITY)
Admission: RE | Admit: 2020-12-25 | Discharge: 2020-12-25 | Disposition: A | Discharge status: Home / Self Care | Payer: Medicare Other | Source: Ambulatory Visit | Attending: Internal Medicine | Admitting: Internal Medicine

## 2020-12-25 ENCOUNTER — Encounter (HOSPITAL_COMMUNITY)
Admission: RE | Disposition: A | Discharge status: Home / Self Care | Payer: Self-pay | Source: Ambulatory Visit | Attending: Internal Medicine

## 2020-12-25 DIAGNOSIS — I5022 Chronic systolic (congestive) heart failure: Secondary | ICD-10-CM | POA: Insufficient documentation

## 2020-12-25 DIAGNOSIS — I82B12 Acute embolism and thrombosis of left subclavian vein: Secondary | ICD-10-CM | POA: Insufficient documentation

## 2020-12-25 DIAGNOSIS — E785 Hyperlipidemia, unspecified: Secondary | ICD-10-CM | POA: Diagnosis not present

## 2020-12-25 DIAGNOSIS — Z87891 Personal history of nicotine dependence: Secondary | ICD-10-CM | POA: Insufficient documentation

## 2020-12-25 DIAGNOSIS — I4891 Unspecified atrial fibrillation: Secondary | ICD-10-CM | POA: Diagnosis not present

## 2020-12-25 DIAGNOSIS — Z4502 Encounter for adjustment and management of automatic implantable cardiac defibrillator: Secondary | ICD-10-CM | POA: Insufficient documentation

## 2020-12-25 DIAGNOSIS — I442 Atrioventricular block, complete: Secondary | ICD-10-CM | POA: Diagnosis not present

## 2020-12-25 DIAGNOSIS — I110 Hypertensive heart disease with heart failure: Secondary | ICD-10-CM | POA: Diagnosis not present

## 2020-12-25 HISTORY — PX: BIV PACEMAKER INSERTION CRT-P: EP1199

## 2020-12-25 HISTORY — PX: AV NODE ABLATION: EP1193

## 2020-12-25 SURGERY — AV NODE ABLATION

## 2020-12-25 MED ORDER — SODIUM CHLORIDE 0.9% FLUSH: 3.0000 mL | Freq: Two times a day (BID) | INTRAVENOUS | Status: DC

## 2020-12-25 MED ORDER — SODIUM CHLORIDE 0.9 % IV SOLN: INTRAVENOUS | Status: DC

## 2020-12-25 MED ORDER — SODIUM CHLORIDE 0.9 % IV SOLN
INTRAVENOUS | Status: AC
  Filled 2020-12-25: qty 2

## 2020-12-25 MED ORDER — SODIUM CHLORIDE 0.9 % IV SOLN: 250.0000 mL | INTRAVENOUS | Status: DC | PRN

## 2020-12-25 MED ORDER — POVIDONE-IODINE 10 % EX SWAB
2.0000 "application " | Freq: Once | CUTANEOUS | Status: AC
  Administered 2020-12-25: 2 via TOPICAL

## 2020-12-25 MED ORDER — BUPIVACAINE HCL (PF) 0.25 % IJ SOLN
INTRAMUSCULAR | Status: AC
  Filled 2020-12-25: qty 30

## 2020-12-25 MED ORDER — SODIUM CHLORIDE 0.9% FLUSH: 3.0000 mL | INTRAVENOUS | Status: DC | PRN

## 2020-12-25 MED ORDER — CEFAZOLIN SODIUM-DEXTROSE 2-4 GM/100ML-% IV SOLN
2.0000 g | INTRAVENOUS | Status: AC
  Administered 2020-12-25: 09:00:00 2 g via INTRAVENOUS

## 2020-12-25 MED ORDER — LIDOCAINE HCL (PF) 1 % IJ SOLN
INTRAMUSCULAR | Status: AC
  Filled 2020-12-25: qty 30

## 2020-12-25 MED ORDER — ACETAMINOPHEN 325 MG PO TABS
650.0000 mg | ORAL_TABLET | ORAL | Status: DC | PRN
  Filled 2020-12-25: qty 2

## 2020-12-25 MED ORDER — CEFAZOLIN SODIUM-DEXTROSE 2-4 GM/100ML-% IV SOLN
INTRAVENOUS | Status: AC
  Filled 2020-12-25: qty 100

## 2020-12-25 MED ORDER — SODIUM CHLORIDE 0.9 % IV SOLN
80.0000 mg | INTRAVENOUS | Status: AC
  Administered 2020-12-25: 10:00:00 80 mg

## 2020-12-25 MED ORDER — ONDANSETRON HCL 4 MG/2ML IJ SOLN: 4.0000 mg | Freq: Four times a day (QID) | INTRAMUSCULAR | Status: DC | PRN

## 2020-12-25 MED ORDER — MIDAZOLAM HCL 5 MG/5ML IJ SOLN
INTRAMUSCULAR | Status: DC | PRN
  Administered 2020-12-25 (×3): 1 mg via INTRAVENOUS

## 2020-12-25 MED ORDER — FENTANYL CITRATE (PF) 100 MCG/2ML IJ SOLN
INTRAMUSCULAR | Status: AC
  Filled 2020-12-25: qty 2

## 2020-12-25 MED ORDER — FENTANYL CITRATE (PF) 100 MCG/2ML IJ SOLN
INTRAMUSCULAR | Status: DC | PRN
  Administered 2020-12-25 (×3): 12.5 ug via INTRAVENOUS

## 2020-12-25 MED ORDER — MIDAZOLAM HCL 5 MG/5ML IJ SOLN
INTRAMUSCULAR | Status: AC
  Filled 2020-12-25: qty 5

## 2020-12-25 MED ORDER — IOHEXOL 350 MG/ML SOLN
INTRAVENOUS | Status: DC | PRN
  Administered 2020-12-25: 10:00:00 10 mL

## 2020-12-25 MED ORDER — BUPIVACAINE HCL (PF) 0.25 % IJ SOLN
INTRAMUSCULAR | Status: DC | PRN
  Administered 2020-12-25: 15 mL

## 2020-12-25 MED ORDER — LIDOCAINE HCL (PF) 1 % IJ SOLN
INTRAMUSCULAR | Status: DC | PRN
  Administered 2020-12-25: 60 mL

## 2020-12-25 MED ORDER — CHLORHEXIDINE GLUCONATE 4 % EX LIQD
4.0000 "application " | Freq: Once | CUTANEOUS | Status: AC
  Administered 2020-12-25: 4 via TOPICAL

## 2020-12-25 MED ORDER — HEPARIN (PORCINE) IN NACL 1000-0.9 UT/500ML-% IV SOLN
INTRAVENOUS | Status: DC | PRN
  Administered 2020-12-25: 500 mL

## 2020-12-25 SURGICAL SUPPLY — 15 items
BAG SNAP BAND KOVER 36X36 (MISCELLANEOUS) ×2 IMPLANT
CABLE SURGICAL S-101-97-12 (CABLE) ×3 IMPLANT
CATH CELSIUS THERMO F CV 7FR (ABLATOR) ×2 IMPLANT
GUIDEWIRE ANGLED .035X150CM (WIRE) ×2 IMPLANT
MAT PREVALON FULL STRYKER (MISCELLANEOUS) ×2 IMPLANT
PACEMAKER ALLR CRT-P RF PM3222 (Pacemaker) IMPLANT
PACK EP LATEX FREE (CUSTOM PROCEDURE TRAY) ×3
PACK EP LF (CUSTOM PROCEDURE TRAY) ×1 IMPLANT
PAD DEFIB RADIO PHYSIO CONN (PAD) ×3 IMPLANT
POUCH AIGIS-R ANTIBACT PPM (Mesh General) ×3 IMPLANT
POUCH AIGIS-R ANTIBACT PPM MED (Mesh General) IMPLANT
PPM ALLURE CRT-P RF PM3222 (Pacemaker) ×3 IMPLANT
SHEATH 7FR PRELUDE SNAP 13 (SHEATH) ×2 IMPLANT
SHEATH PINNACLE 8F 10CM (SHEATH) ×2 IMPLANT
TRAY PACEMAKER INSERTION (PACKS) ×3 IMPLANT

## 2020-12-25 NOTE — H&P (Signed)
HPI [name] returns today for followup. He is a pleasant 87 yo man with a h/o chronic systolic heart failure, dyslipidemia, VT, s/p ICD insertion. He has developed atrial fib, was cardioverted and had ERAF despite being on amiodarone. He denies medical non-compliance. He has not had chest pain. He has class 3 CHF symptoms. No ICD shocks.   No Known Allergies           Current Outpatient Medications  Medication Sig Dispense Refill   apixaban (ELIQUIS) 5 MG TABS tablet Take 1 tablet (5 mg total) by mouth 2 (two) times daily. 60 tablet 11   atorvastatin (LIPITOR) 20 MG tablet TAKE 1 TABLET BY MOUTH ONCE DAILY AT BEDTIME. 90 tablet 3   carvedilol (COREG) 25 MG tablet TAKE 1 TABLET TWICE DAILY WITH A MEAL. 180 tablet 2   Cholecalciferol (VITAMIN D-3) 1000 UNITS CAPS Take 1,000 Units by mouth daily.        furosemide (LASIX) 40 MG tablet TAKE 1 TABLET BY MOUTH DAILY. HOLD DOSAGE IF WEIGHT < 183 POUNDS 90 tablet 2   indomethacin (INDOCIN SR) 75 MG CR capsule Take 75 mg by mouth daily as needed (gout attacks).       isosorbide mononitrate (IMDUR) 30 MG 24 hr tablet Take 1 tablet (30 mg total) by mouth daily. 90 tablet 3   losartan (COZAAR) 25 MG tablet TAKE 1/2 TABLET AT BEDTIME 45 tablet 3   omeprazole (PRILOSEC) 20 MG capsule TAKE 1 CAPSULE DAILY (Patient taking differently: Take 20 mg by mouth daily.) 90 capsule 2   phenylephrine (SUDAFED PE) 10 MG TABS tablet Take 10 mg by mouth every 6 (six) hours as needed (congestion).       spironolactone (ALDACTONE) 25 MG tablet TAKE 1/2 TABLET BY MOUTH ONCE A DAY. 45 tablet 3   Tamsulosin HCl (FLOMAX) 0.4 MG CAPS Take 0.4 mg by mouth daily after breakfast.         No current facility-administered medications for this visit.            Past Medical History:  Diagnosis Date   CAD (coronary artery disease)     CHF (congestive heart failure) (HCC)     Dyslipidemia     History of ventricular fibrillation     HTN (hypertension)      Hx-sudden cardiac arrest     Ischemic cardiomyopathy     LBBB (left bundle branch block)     Ventricular tachycardia ([place])        ROS:    All systems reviewed and negative except as noted in the HPI.          Past Surgical History:  Procedure Laterality Date   CARDIAC CATHETERIZATION   10/12/2008   CARDIAC DEFIBRILLATOR PLACEMENT        [employer]   CARDIOVERSION N/A 06/28/2019    Procedure: CARDIOVERSION;  Surgeon: Mark Perla, MD;  Location: [facility] ENDOSCOPY;  Service: Cardiovascular;  Laterality: N/A;   CARDIOVERSION N/A 02/14/2020    Procedure: CARDIOVERSION;  Surgeon: Mark Klein, MD;  Location: [facility] ENDOSCOPY;  Service: Cardiovascular;  Laterality: N/A;   DOPPLER ECHOCARDIOGRAPHY   2008, 2011   EP IMPLANTABLE DEVICE N/A 05/19/2014    Procedure: ICD/BIV ICD Generator Changeout;  Surgeon: Mark Lance, MD;  Location: [facility];  Service: Cardiovascular;  Laterality: N/A;   TONSILECTOMY, ADENOIDECTOMY, BILATERAL MYRINGOTOMY AND TUBES  Family History  Problem Relation Age of Onset   Coronary artery disease Other          Social History         Socioeconomic History   Marital status: Divorced      Spouse name: Not on file   Number of children: Not on file   Years of education: Not on file   Highest education level: Not on file  Occupational History   Occupation: furniture truck driver  Tobacco Use   Smoking status: Former      Types: Cigarettes      Quit date: 01/03/1985      Years since quitting: 35.7   Smokeless tobacco: Never  Vaping Use   Vaping Use: Never used  Substance and Sexual Activity   Alcohol use: Never   Drug use: Never   Sexual activity: Not on file  Other Topics Concern   Not on file  Social History Narrative   Not on file    Social Determinants of Health       Financial Resource Strain: Not on file  Food Insecurity: No Food Insecurity   Worried About Charity fundraiser in the Last Year: Never true   Ran Out  of Food in the Last Year: Never true  Transportation Needs: Not on file  Physical Activity: Not on file  Stress: Not on file  Social Connections: Not on file  Intimate Partner Violence: Not on file        BP 106/62    Pulse 79    Ht 5\' 7"  (1.702 m)    Wt 188 lb (85.3 kg)    SpO2 98%    BMI 29.44 kg/m    Physical Exam:   Well appearing NAD HEENT: Unremarkable Neck:  7 cm JVD, no thyromegally Lymphatics:  No adenopathy Back:  No CVA tenderness Lungs:  Clear with scattered basilar rales HEART:  IRegular rate rhythm, no murmurs, no rubs, no clicks Abd:  soft, positive bowel sounds, no organomegally, no rebound, no guarding Ext:  2 plus pulses, no edema, no cyanosis, no clubbing Skin:  No rashes no nodules Neuro:  CN II through XII intact, motor grossly intact   EKG - atrial fib with ventricular pacing   DEVICE  Normal device function.  See PaceArt for details. Approaching ERI   Assess/Plan:  Atrial fib - his VR is not well controlled and he is pacing 70% of the time. I have recommended AV node ablation when he returns for device change out. Chronic systolic heart failure - he has class 3 symptoms and his QRS is quite wide. I would recommend attempting to add a left bundle area lead when he returns for gen change if his vein is open. ICD - his device is approaching ERI. I will plan to change out in a little over 2 months. VT - he is s/p ablation and has not had more VT since his ablation. We will follow. Mark Nguyen  EP Attending  Patient seen and examined. He has reached ERI. He will undergo gen change with a downgrade to a biv ppm. If his vein is open, I plan to place a left bundle area lead in follwed by AV node ablation.  Carleene Overlie Mark Maenza,MD

## 2020-12-25 NOTE — Progress Notes (Signed)
SITE AREA: left groin/femoral  SITE PRIOR TO REMOVAL:  LEVEL 0  PRESSURE APPLIED FOR: approximately 10 minutes by Alice Rieger  MANUAL: yes  PATIENT STATUS DURING PULL: stable   POST PULL SITE:  LEVEL 0  POST PULL INSTRUCTIONS GIVEN: yes  POST PULL PULSES PRESENT: bilateral pedal pulses at +2  DRESSING APPLIED: gauze with tegaderm  BEDREST BEGINS @ 1055  COMMENTS:

## 2020-12-25 NOTE — Discharge Instructions (Addendum)
Post procedure care instructions ([place]) No driving for 4 days. No lifting over 5 lbs for 1 week. No vigorous or sexual activity for 1 week.  Keep procedure site clean & dry. If you notice increased pain, swelling, bleeding or pus, call/return!    Post procedure wound care instructions (PACEMAKER/CHEST wound  SITE CARE) Keep incision clean and dry for 10 days. You can remove outer large plastic dressing tomorrow. Leave steri-strips (little pieces of tape) on until seen in the office for wound check appointment. Call the office [phone]) for redness, drainage, swelling, or fever.     Cardiac Ablation, Care After  This sheet gives you information about how to care for yourself after your procedure. Your health care provider may also give you more specific instructions. If you have problems or questions, contact your health care provider. What can I expect after the procedure? After the procedure, it is common to have: Bruising around your puncture site. Tenderness around your puncture site. Skipped heartbeats. Tiredness (fatigue).  Follow these instructions at home: Puncture site care  Follow instructions from your health care provider about how to take care of your puncture site. Make sure you: If present, leave stitches (sutures), skin glue, or adhesive strips in place. These skin closures may need to stay in place for up to 2 weeks. If adhesive strip edges start to loosen and curl up, you may trim the loose edges. Do not remove adhesive strips completely unless your health care provider tells you to do that. If a large square bandage is present, this may be removed 24 hours after surgery.  Check your puncture site every day for signs of infection. Check for: Redness, swelling, or pain. Fluid or blood. If your puncture site starts to bleed, lie down on your back, apply firm pressure to the area, and contact your health care provider. Warmth. Pus or a bad smell. Driving Do  not drive for at least 4 days after your procedure or however long your health care provider recommends. (Do not resume driving if you have previously been instructed not to drive for other health reasons.) Do not drive or use heavy machinery while taking prescription pain medicine. Activity Avoid activities that take a lot of effort for at least 7 days after your procedure. Do not lift anything that is heavier than 5 lb (4.5 kg) for one week.  No sexual activity for 1 week.  Return to your normal activities as told by your health care provider. Ask your health care provider what activities are safe for you. General instructions Take over-the-counter and prescription medicines only as told by your health care provider. Do not use any products that contain nicotine or tobacco, such as cigarettes and e-cigarettes. If you need help quitting, ask your health care provider. You may shower after 24 hours, but Do not take baths, swim, or use a hot tub for 1 week.  Do not drink alcohol for 24 hours after your procedure. Keep all follow-up visits as told by your health care provider. This is important. Contact a health care provider if: You have redness, mild swelling, or pain around your puncture site. You have fluid or blood coming from your puncture site that stops after applying firm pressure to the area. Your puncture site feels warm to the touch. You have pus or a bad smell coming from your puncture site. You have a fever. You have chest pain or discomfort that spreads to your neck, jaw, or arm. You are  sweating a lot. You feel nauseous. You have a fast or irregular heartbeat. You have shortness of breath. You are dizzy or light-headed and feel the need to lie down. You have pain or numbness in the arm or leg closest to your puncture site. Get help right away if: Your puncture site suddenly swells. Your puncture site is bleeding and the bleeding does not stop after applying firm pressure to  the area. These symptoms may represent a serious problem that is an emergency. Do not wait to see if the symptoms will go away. Get medical help right away. Call your local emergency services (911 in the [place] Do not drive yourself to the hospital. Summary After the procedure, it is normal to have bruising and tenderness at the puncture site in your groin, neck, or forearm. Check your puncture site every day for signs of infection. Get help right away if your puncture site is bleeding and the bleeding does not stop after applying firm pressure to the area. This is a medical emergency. This information is not intended to replace advice given to you by your health care provider. Make sure you discuss any questions you have with your health care provider.

## 2021-01-06 ENCOUNTER — Ambulatory Visit (INDEPENDENT_AMBULATORY_CARE_PROVIDER_SITE_OTHER): Payer: Medicare Other

## 2021-01-06 ENCOUNTER — Other Ambulatory Visit: Payer: Self-pay

## 2021-01-06 DIAGNOSIS — I5022 Chronic systolic (congestive) heart failure: Secondary | ICD-10-CM

## 2021-01-06 LAB — CUP PACEART INCLINIC DEVICE CHECK
Battery Remaining Longevity: 72 mo
Battery Voltage: 3.04 V
Brady Statistic RA Percent Paced: 0 %
Brady Statistic RV Percent Paced: 99 %
Date Time Interrogation Session: 20230104143022
Implantable Lead Implant Date: 20030319
Implantable Lead Implant Date: 20030319
Implantable Lead Implant Date: 20110211
Implantable Lead Location: 753858
Implantable Lead Location: 753859
Implantable Lead Location: 753860
Implantable Lead Model: 158
Implantable Lead Model: 4087
Implantable Lead Serial Number: 113412
Implantable Lead Serial Number: 159867
Implantable Pulse Generator Implant Date: 20221223
Lead Channel Impedance Value: 400 Ohm
Lead Channel Impedance Value: 662.5 Ohm
Lead Channel Pacing Threshold Amplitude: 0.75 V
Lead Channel Pacing Threshold Amplitude: 1.875 V
Lead Channel Pacing Threshold Pulse Width: 0.5 ms
Lead Channel Pacing Threshold Pulse Width: 1 ms
Lead Channel Setting Pacing Amplitude: 2.125
Lead Channel Setting Pacing Amplitude: 2.5 V
Lead Channel Setting Pacing Pulse Width: 0.5 ms
Lead Channel Setting Pacing Pulse Width: 1 ms
Lead Channel Setting Sensing Sensitivity: 2 mV
Pulse Gen Model: 3222
Pulse Gen Serial Number: 3978258

## 2021-01-06 NOTE — Patient Instructions (Signed)

## 2021-01-06 NOTE — Progress Notes (Signed)
Wound check appointment. Steri-strips removed. Wound without redness or edema. Incision edges approximated, wound well healed. Normal device function. Thresholds, sensing, and impedances consistent with implant measurements. Device programmed at chronic settings/no new leads implanted. Histogram distribution appropriate for patient and level of activity. No mode switches or high ventricular rates noted. Patient educated about wound care. ROV in 3 months with implanting physician.

## 2021-01-22 ENCOUNTER — Other Ambulatory Visit: Payer: Self-pay

## 2021-01-22 ENCOUNTER — Encounter: Payer: Self-pay | Admitting: Student

## 2021-01-22 ENCOUNTER — Ambulatory Visit (INDEPENDENT_AMBULATORY_CARE_PROVIDER_SITE_OTHER): Payer: Medicare Other | Admitting: Student

## 2021-01-22 VITALS — BP 120/82 | HR 80 | Ht 67.0 in | Wt 185.2 lb

## 2021-01-22 DIAGNOSIS — I5022 Chronic systolic (congestive) heart failure: Secondary | ICD-10-CM

## 2021-01-22 DIAGNOSIS — I428 Other cardiomyopathies: Secondary | ICD-10-CM | POA: Diagnosis not present

## 2021-01-22 DIAGNOSIS — I472 Ventricular tachycardia, unspecified: Secondary | ICD-10-CM

## 2021-01-22 DIAGNOSIS — I4819 Other persistent atrial fibrillation: Secondary | ICD-10-CM | POA: Diagnosis not present

## 2021-01-22 LAB — CUP PACEART INCLINIC DEVICE CHECK
Battery Remaining Longevity: 72 mo
Battery Voltage: 3.01 V
Brady Statistic RA Percent Paced: 0 %
Brady Statistic RV Percent Paced: 99 %
Date Time Interrogation Session: 20230120111848
Implantable Lead Implant Date: 20030319
Implantable Lead Implant Date: 20030319
Implantable Lead Implant Date: 20110211
Implantable Lead Location: 753858
Implantable Lead Location: 753859
Implantable Lead Location: 753860
Implantable Lead Model: 158
Implantable Lead Model: 4087
Implantable Lead Serial Number: 113412
Implantable Lead Serial Number: 159867
Implantable Pulse Generator Implant Date: 20221223
Lead Channel Impedance Value: 412.5 Ohm
Lead Channel Impedance Value: 662.5 Ohm
Lead Channel Pacing Threshold Amplitude: 0.75 V
Lead Channel Pacing Threshold Amplitude: 0.75 V
Lead Channel Pacing Threshold Amplitude: 2 V
Lead Channel Pacing Threshold Pulse Width: 0.5 ms
Lead Channel Pacing Threshold Pulse Width: 0.5 ms
Lead Channel Pacing Threshold Pulse Width: 1 ms
Lead Channel Setting Pacing Amplitude: 2.25 V
Lead Channel Setting Pacing Amplitude: 2.5 V
Lead Channel Setting Pacing Pulse Width: 0.5 ms
Lead Channel Setting Pacing Pulse Width: 1 ms
Lead Channel Setting Sensing Sensitivity: 2 mV
Pulse Gen Model: 3222
Pulse Gen Serial Number: 3978258

## 2021-01-22 NOTE — Patient Instructions (Signed)
Medication Instructions:  Your physician recommends that you continue on your current medications as directed. Please refer to the Current Medication list given to you today.  *If you need a refill on your cardiac medications before your next appointment, please call your pharmacy*   Lab Work: None If you have labs (blood work) drawn today and your tests are completely normal, you will receive your results only by: [place] (if you have MyChart) OR A paper copy in the mail If you have any lab test that is abnormal or we need to change your treatment, we will call you to review the results.   Follow-Up: At [facility], you and your health needs are our priority.  As part of our continuing mission to provide you with exceptional heart care, we have created designated Provider Care Teams.  These Care Teams include your primary Cardiologist (physician) and Advanced Practice Providers (APPs -  Physician Assistants and Nurse Practitioners) who all work together to provide you with the care you need, when you need it.  We recommend signing up for the patient portal called "MyChart".  Sign up information is provided on this After Visit Summary.  MyChart is used to connect with patients for Virtual Visits (Telemedicine).  Patients are able to view lab/test results, encounter notes, upcoming appointments, etc.  Non-urgent messages can be sent to your provider as well.   To learn more about what you can do with MyChart, go to [email or web address].    Your next appointment:   As scheduled

## 2021-01-22 NOTE — Progress Notes (Signed)
Electrophysiology Office Note Date: 01/22/2021  ID:  Mark, Nguyen [birth year], MRN [id number]  PCP: Lacinda Axon, MD Primary Cardiologist: None Electrophysiologist: Cristopher Peru, MD   CC: Routine ICD follow-up  Mark Nguyen is a 88 y.o. male seen today for Cristopher Peru, MD for routine electrophysiology followup.  Since last being seen in our clinic the patient reports doing very well. He feels like a "new person".  he denies chest pain, palpitations, dyspnea, PND, orthopnea, nausea, vomiting, dizziness, syncope, edema, weight gain, or early satiety. He has not had ICD shocks.   Device History: St. Jude BiV ICD implanted as dual chamber 2003, upgrade to [place] ICD 2011, PPM downgrade 2022 for CHB / uncontrolled AF S/p AV nodal ablation 12/25/2020  Past Medical History:  Diagnosis Date   CAD (coronary artery disease)    CHF (congestive heart failure) (HCC)    Dyslipidemia    History of ventricular fibrillation    HTN (hypertension)    Hx-sudden cardiac arrest    Ischemic cardiomyopathy    LBBB (left bundle branch block)    Ventricular tachycardia    Past Surgical History:  Procedure Laterality Date   AV NODE ABLATION N/A 12/25/2020   Procedure: AV NODE ABLATION;  Surgeon: Evans Lance, MD;  Location: [place] [facility];  Service: Cardiovascular;  Laterality: N/A;   BIV PACEMAKER INSERTION CRT-P N/A 12/25/2020   Procedure: DOWNGRADE BIV PACEMAKER INSERTION CRT-P;  Surgeon: Evans Lance, MD;  Location: [place] [facility];  Service: Cardiovascular;  Laterality: N/A;   CARDIAC CATHETERIZATION  10/12/2008   CARDIAC DEFIBRILLATOR PLACEMENT     St Jude   CARDIOVERSION N/A 06/28/2019   Procedure: CARDIOVERSION;  Surgeon: Lelon Perla, MD;  Location[facility];  Service: Cardiovascular;  Laterality: N/A;   CARDIOVERSION N/A 02/14/2020   Procedure: CARDIOVERSION;  Surgeon: Sanda Klein, MD;  Location: [place] ENDOSCOPY;  Service: Cardiovascular;  Laterality:  N/A;   DOPPLER ECHOCARDIOGRAPHY  2008, 2011   EP IMPLANTABLE DEVICE N/A 05/19/2014   Procedure: ICD/BIV ICD Generator Changeout;  Surgeon: Evans Lance, MD;  Location: [place] [facility];  Service: Cardiovascular;  Laterality: N/A;   TONSILECTOMY, ADENOIDECTOMY, BILATERAL MYRINGOTOMY AND TUBES      Current Outpatient Medications  Medication Sig Dispense Refill   apixaban (ELIQUIS) 5 MG TABS tablet Take 1 tablet (5 mg total) by mouth 2 (two) times daily. 60 tablet 11   atorvastatin (LIPITOR) 20 MG tablet TAKE 1 TABLET BY MOUTH ONCE DAILY AT BEDTIME. 90 tablet 3   carvedilol (COREG) 25 MG tablet TAKE 1 TABLET TWICE DAILY WITH A MEAL. 180 tablet 2   Cholecalciferol (VITAMIN D-3) 1000 UNITS CAPS Take 1,000 Units by mouth daily.      furosemide (LASIX) 40 MG tablet TAKE 1 TABLET BY MOUTH DAILY. HOLD DOSAGE IF WEIGHT < 183 POUNDS 90 tablet 2   indomethacin (INDOCIN SR) 75 MG CR capsule Take 75 mg by mouth daily as needed (gout attacks).     isosorbide mononitrate (IMDUR) 30 MG 24 hr tablet Take 1 tablet (30 mg total) by mouth daily. 90 tablet 3   losartan (COZAAR) 25 MG tablet TAKE 1/2 TABLET AT BEDTIME 45 tablet 3   omeprazole (PRILOSEC) 20 MG capsule TAKE 1 CAPSULE DAILY 90 capsule 3   phenylephrine (SUDAFED PE) 10 MG TABS tablet Take 10 mg by mouth every 6 (six) hours as needed (congestion).     spironolactone (ALDACTONE) 25 MG tablet TAKE 1/2 TABLET BY  MOUTH ONCE A DAY. 45 tablet 3   Tamsulosin HCl (FLOMAX) 0.4 MG CAPS Take 0.4 mg by mouth daily after breakfast.      No current facility-administered medications for this visit.    Allergies:   Patient has no known allergies.   Social History: Social History   Socioeconomic History   Marital status: Divorced    Spouse name: Not on file   Number of children: Not on file   Years of education: Not on file   Highest education level: Not on file  Occupational History   Occupation: furniture truck driver  Tobacco Use   Smoking status:  Former    Types: Cigarettes    Quit date: 01/03/1985    Years since quitting: 36.0   Smokeless tobacco: Never  Vaping Use   Vaping Use: Never used  Substance and Sexual Activity   Alcohol use: Never   Drug use: Never   Sexual activity: Not on file  Other Topics Concern   Not on file  Social History Narrative   Not on file   Social Determinants of Health   Financial Resource Strain: Not on file  Food Insecurity: No Food Insecurity   Worried About Running Out of Food in the Last Year: Never true   Alfred in the Last Year: Never true  Transportation Needs: No Transportation Needs   Lack of Transportation (Medical): No   Lack of Transportation (Non-Medical): No  Physical Activity: Not on file  Stress: Not on file  Social Connections: Not on file  Intimate Partner Violence: Not on file    Family History: Family History  Problem Relation Age of Onset   Coronary artery disease Other     Review of Systems: All other systems reviewed and are otherwise negative except as noted above.   Physical Exam: Vitals:   01/22/21 1035  BP: 120/82  Pulse: 80  SpO2: 98%  Weight: 185 lb 3.2 oz (84 kg)  Height: 5\' 7"  (1.702 m)     GEN- The patient is well appearing, alert and oriented x 3 today.   HEENT: normocephalic, atraumatic; sclera clear, conjunctiva pink; hearing intact; oropharynx clear; neck supple, no JVP Lymph- no cervical lymphadenopathy Lungs- Clear to ausculation bilaterally, normal work of breathing.  No wheezes, rales, rhonchi Heart- Regular rate and rhythm, no murmurs, rubs or gallops, PMI not laterally displaced GI- soft, non-tender, non-distended, bowel sounds present, no hepatosplenomegaly Extremities- no clubbing or cyanosis. No edema; DP/PT/radial pulses 2+ bilaterally MS- no significant deformity or atrophy Skin- warm and dry, no rash or lesion; ICD pocket well healed Psych- euthymic mood, full affect Neuro- strength and sensation are intact  ICD  interrogation- reviewed in detail today,  See PACEART report  EKG:  EKG is ordered today. Personal review of EKG ordered today shows atrial fibrillation with V pacing at 80 bpm  Recent Labs: 03/12/2020: ALT 20; TSH 3.220 12/10/2020: BUN 17; Creatinine, Ser 1.26; Hemoglobin 10.1; Platelets 137; Potassium 3.5; Sodium 144   Wt Readings from Last 3 Encounters:  01/22/21 185 lb 3.2 oz (84 kg)  12/25/20 188 lb (85.3 kg)  09/25/20 188 lb (85.3 kg)     Other studies Reviewed: Additional studies/ records that were reviewed today include: Previous EP office notes.   Assessment and Plan:  1.  Chronic systolic dysfunction s/p St. Jude CRT-D  (s/p PPM downgrade) euvolemic today Stable on an appropriate medical regimen Normal ICD function See Pace Art report No changes today  2. Poorly  controlled AF S/p AV nodal ablation 12/2020 LRL changed to 70 today.  He feels "like his old self again"  3. VT s/p ablation Quiescent.  Current medicines are reviewed at length with the patient today.     Disposition:   Follow up with Dr. Lovena Le as scheduled    Signed, Shirley Friar, PA-C  01/22/2021 11:18 AM  [facility] [address] [place] [address] [phone] (office) [phone] (fax)

## 2021-01-25 ENCOUNTER — Other Ambulatory Visit: Payer: Self-pay | Admitting: *Deleted

## 2021-01-25 NOTE — Patient Outreach (Signed)
[facility]) Care Management [place] Note   01/25/2021 Name:  Mark Nguyen MRN:  [id number] DOB:  1931/10/12  Summary: Per patient he is feeling much better. Patient stated that he is able to move around better and not feel so short of breath since ablation. Patient stated that he is taking medications as per ordered. He has not had any recent falls. Per patient occasional pain in knees. His appetite is good. Patient has concerns  about the cost of Eliquis.  Recommendations/Changes made from today's visit: Take medications as per ordered Keep all receipts for out of pocket co pays to be eligible of Eliquis   Subjective: Mark Nguyen is an 88 y.o. year old male who is a primary patient of [place], Charisse March, MD. The care management team was consulted for assistance with care management and/or care coordination needs.    RN Health Coach completed Telephone Visit today.   Objective:  Medications Reviewed Today     Reviewed by Alver Fisher, CMA (Certified Medical Assistant) on 01/22/21 at 1035  Med List Status: <None>   Medication Order Taking? Sig Documenting Provider Last Dose Status Informant  apixaban (ELIQUIS) 5 MG TABS tablet [id number] Yes Take 1 tablet (5 mg total) by mouth 2 (two) times daily. Evans Lance, MD Taking Active Self  atorvastatin (LIPITOR) 20 MG tablet [id number] Yes TAKE 1 TABLET BY MOUTH ONCE DAILY AT BEDTIME. Evans Lance, MD Taking Active Self  carvedilol (COREG) 25 MG tablet [id number] Yes TAKE 1 TABLET TWICE DAILY WITH A MEAL. Evans Lance, MD Taking Active Self  Cholecalciferol (VITAMIN D-3) 1000 UNITS CAPS [id number] Yes Take 1,000 Units by mouth daily.  [provider] Taking Active Self  furosemide (LASIX) 40 MG tablet [id number] Yes TAKE 1 TABLET BY MOUTH DAILY. HOLD DOSAGE IF WEIGHT < 183 POUNDS Evans Lance, MD Taking Active Self  indomethacin (INDOCIN SR) 75 MG CR capsule [id number] Yes Take 75 mg by  mouth daily as needed (gout attacks). [provider] Taking Active Self  isosorbide mononitrate (IMDUR) 30 MG 24 hr tablet [id number] Yes Take 1 tablet (30 mg total) by mouth daily. Baldwin [place], PA-C Taking Active Self  losartan (COZAAR) 25 MG tablet [id number] Yes TAKE 1/2 TABLET AT BEDTIME Evans Lance, MD Taking Active Self  omeprazole (PRILOSEC) 20 MG capsule [id number] Yes TAKE 1 CAPSULE DAILY Evans Lance, MD Taking Active Self  phenylephrine (SUDAFED PE) 10 MG TABS tablet [id number] Yes Take 10 mg by mouth every 6 (six) hours as needed (congestion). [provider] Taking Active Self  spironolactone (ALDACTONE) 25 MG tablet [id number] Yes TAKE 1/2 TABLET BY MOUTH ONCE A DAY. Evans Lance, MD Taking Active Self  Tamsulosin HCl (FLOMAX) 0.4 MG CAPS [id number] Yes Take 0.4 mg by mouth daily after breakfast.  [provider] Taking Active Self             SDOH:  (Social Determinants of Health) assessments and interventions performed:  SDOH Interventions    Flowsheet Row Most Recent Value  SDOH Interventions   Food Insecurity Interventions Intervention Not Indicated  Housing Interventions Intervention Not Indicated  Transportation Interventions Intervention Not Indicated       Care Plan  Review of patient past medical history, allergies, medications, health status, including review of consultants reports, laboratory and other test data, was performed as part of comprehensive evaluation for care management services.   Care Plan : Atrial Fibrillation (Adult)  Updates made by Verlin Grills, RN since 01/25/2021 12:00 AM     Problem: Dysrhythmia (Atrial Fibrillation) Resolved 01/25/2021  Priority: High  Onset Date: 01/22/2020  Note:   [id number] Resolving due to duplicate goal     Long-Range Goal: Heart Rate and Rhythm Monitored and Managed Completed 01/25/2021  Start Date: 10/09/2019  Expected End Date: 01/01/2022  Recent Progress: On  track  Priority: High  Note:   Evidence-based guidance:  Assess heart rate, rhythm and presence of symptoms at each encounter using pulse palpation, blood pressure monitor and review of symptom diary.  Consider pulse check plus an electrocardiogram (ECG) in women over the age of 49 years.  Prepare patient for diagnostic studies, such as 24-hour ambulatory monitoring, echocardiogram or implantable loop recorder, based on presentation and risk factors, such as cryptogenic stroke.  Prepare patient for use of pharmacologic therapy, such as beta-blocker, calcium channel blocker, digoxin, antiarrhythmic, based on presentation, risk factors and patient preferences.  Monitor side effects and expect periodic adjustments.  Provide an opportunity for shared decision-making when uncontrolled rate and rhythm persist and invasive therapy is considered, such as left atrial ablation, pacemaker, cardioversion or cardiac resynchronization therapy.  Provide reassurance, as initial symptoms and potential recurrence are frightening to patient and family/caregiver and may impact quality of life.  Provide prompt follow-up after hospitalization to support transition of care; consider referral to home care or community support program, especially in presence of comorbidity.  Encourage exercise 2 to 3 times per week, based on ability and tolerance, to improve physical and cardiac function and quality of life.   Notes:  [id number] Patient is being monitored regularly and keeping all appointments [id number] Patient has bee having his Afib monitored and it has not been in rhythm. Per patient he is having an ablation done in December      Task: Alleviate Barriers to Dysrhythmia Management Completed 01/25/2021  Due Date: 01/01/2022  Note:   Care Management Activities:    - activity or exercise based on tolerance encouraged - medication-adherence assessment completed - reassurance provided    Notes: [id number] Patient is  resting frequently as he gets tired. He is drinking plenty of fluids when working outside    Problem: Stroke Risk (Atrial Fibrillation) Resolved 01/25/2021  Priority: Medium  Onset Date: 01/22/2020  Note:   [id number] Resolving due to duplicate goal     Long-Range Goal: Stroke Risk Minimized Completed 01/25/2021  Start Date: 10/09/2019  Expected End Date: 01/01/2022  Recent Progress: On track  Priority: Medium  Note:   Evidence-based guidance:  Provide anticipatory guidance regarding the signs/symptoms of stroke and heart failure.  Assess bleeding risk using a validated tool; review results with patient.  Assess and review stroke risk using a validated tool that include age and sex differences to determine choice, benefit and timing of anticoagulant therapy.  Prepare patient for use of pharmacologic therapy, such as nonvitamin K oral anticoagulant, vitamin K antagonist, low-molecular-weight heparin or antiplatelet (in combination with another agent) based on stroke risk score.  Monitor side effects and anticipate need for periodic adjustments.  Prepare patient for periodic coagulation laboratory testing when taking vitamin K antagonist; consider at-home monitoring and management.  Review alcohol risk screen or self-report of alcohol use (amount and frequency) when anticoagulant (especially vitamin K antagonist) is prescribed.  Encourage consistent consumption, not restriction, of dietary vitamin K, such as green, leafy vegetables, during warfarin therapy and especially during initiation phase.  Provide anticipatory guidance regarding the risk of bleeding, awareness  of signs/symptoms of bleeding and minimizing bleeding risk, including fall and injury prevention.  Perform medication-adherence assessment when taking oral anticoagulants that do not require periodic monitoring or when poor anticoagulation control based on monitoring is noted.  Address barriers to treatment adherence, such as  cognitive function, illness, drug interaction, medication cost, presence of depression or anxiety and lifestyle factors, including diet and alcohol consumption.  Promote shared decision-making using stroke and treatment benefits and risks, lifestyle and patient preferences to determine type of antithrombotic therapy.   Notes:  Provided picture sheet of F.A.S.T. acronym for symptoms of stroke [id number] Patient is aware of the signs and symptoms of stroke.     Task: Minimize Stroke Risk Completed 01/25/2021  Due Date: 01/01/2022  Note:   Care Management Activities:    - fall and injury prevention strategies promoted - importance of rapid response to stroke symptoms reviewed - stroke risk screen reviewed - stroke signs and symptoms reviewed    Notes:  [id number] Patient sent sheet of F.A.S.T. to put on refrigerator    Care Plan : [place] of Care  Updates made by Jotham Ahn, Eppie Gibson, RN since 01/25/2021 12:00 AM     Problem: Knowledge Deficit Related to Atrial Fibrillation and Care Coordination Needs   Priority: High     Long-Range Goal: Development Plan of Care fo Management of Atrial Fibrillation   Start Date: 01/25/2021  Expected End Date: 01/01/2022  Priority: High  Note:   Current Barriers:  Knowledge Deficits related to plan of care for management of Atrial Fibrillation   RNCM Clinical Goal(s):  Patient will verbalize understanding of plan for management of Atrial Fibrillation as evidenced by continuation of monitoring pulse and adhering to low sodium diet  through collaboration with RN Care manager, provider, and care team.   Interventions: Inter-disciplinary care team collaboration (see longitudinal plan of care) Evaluation of current treatment plan related to  self management and patient's adherence to plan as established by provider   Patient Goals/Self-Care Activities: Take medications as prescribed   Attend all scheduled provider appointments Call  pharmacy for medication refills 3-7 days in advance of running out of medications Attend church or other social activities Perform all self care activities independently  Perform IADL's (shopping, preparing meals, housekeeping, managing finances) independently Call provider office for new concerns or questions  call the Suicide and Crisis Lifeline: 988 if experiencing a Mental Health or [place]  - check pulse (heart) rate once a day - make a plan to eat healthy - take medicine as prescribed      Care Plan : Wellness (Adult)  Updates made by Verlin Grills, RN since 01/25/2021 12:00 AM     Problem: Medication Adherence (Wellness) Resolved 01/25/2021  Priority: Medium  Onset Date: 01/22/2020  Note:   [id number] Resolving due to duplicate goal     Goal: Medication Adherence Maintained Completed 01/25/2021  Start Date: 01/22/2020  Expected End Date: 01/01/2021  Recent Progress: On track  Priority: Medium  Note:   Evidence-based guidance:  Develop a complete and accurate medication list including those prescribed and over-the-counter, those taken only occasionally and those not taken by mouth such as injections, inhalers, ointments or creams and drops.  Review all medications to determine if patient or caregiver knows why the medications are given and if taken as prescribed.  Complete or review a medication adherence assessment including barriers to medication adherence.  Arrange and encourage counseling and medication review by pharmacist.  Assess barriers  to medication adherence.  Manage poor understanding or health literacy by using easy to understand language, teach-back, visual aids and teaching only 2 or 3 points at a time.  Assess presence of side effects; provide suggestions to manage or reduce side effects.  Consult with provider and/or pharmacist regarding substitute medication, changing dose, simplification of regimen or safe discontinuation of some  medications.  Encourage the use of medication reminders such as clock or cell phone alarm, color coding, pillboxes for am/pm and days of the week, pharmacy refill reminder, auto-refill system or mail-order option.  Assist with resources when cost is a barrier; refer to prescription assistance programs; confirm that generics are prescribed whenever possible; consider 90-day prescriptions to reduce copay cost; synchronize refills.  Provide help to complete medication assistance applications or health insurance forms as needed.  Complete a follow-up call 2 to 3 weeks after medication self-management plan developed; assess adherence and understanding, as well as listen to patient or caregiver concerns; amend plan as needed.  Provide frequent follow-up providing motivation, encouragement and support when medication nonadherence is identified.   Notes:     Task: Optimize Medication Use Completed 01/25/2021  Due Date: 01/01/2021  Note:   Care Management Activities:    - completion of financial assistance requests facilitated - medication list reviewed - medication-adherence assessment completed - understanding of current medications assessed    Notes:     Care Plan : General Nursing  (Adult)  Updates made by Nikesha Kwasny, Eppie Gibson, RN since 01/25/2021 12:00 AM     Problem: Knowledge Deficit Related to Afib and Care Coordination Needs        Plan: Telephone follow up appointment with care management team member scheduled for:  April 2023. The patient has been provided with contact information for the care management team and has been advised to call with any health related questions or concerns.  RN sent [employer] Patient assistance [employer] application form  [place] Care Management [phone]

## 2021-01-25 NOTE — Patient Instructions (Signed)
Visit Information  Thank you for taking time to visit with me today. Please don't hesitate to contact me if I can be of assistance to you before our next scheduled telephone appointment.  Following are the goals we discussed today:  Current Barriers:  Knowledge Deficits related to plan of care for management of Atrial Fibrillation   RNCM Clinical Goal(s):  Patient will verbalize understanding of plan for management of Atrial Fibrillation as evidenced by continuation of monitoring pulse and adhering to low sodium diet through collaboration with RN Care manager, provider, and care team.   Interventions: Inter-disciplinary care team collaboration (see longitudinal plan of care) Evaluation of current treatment plan related to  self management and patient's adherence to plan as established by provider   Patient Goals/Self-Care Activities: Take medications as prescribed   Attend all scheduled provider appointments Call pharmacy for medication refills 3-7 days in advance of running out of medications Attend church or other social activities Perform all self care activities independently  Perform IADL's (shopping, preparing meals, housekeeping, managing finances) independently Call provider office for new concerns or questions  call the Suicide and Crisis Lifeline: 988 if experiencing a Mental Health or [place]  - check pulse (heart) rate once a day - make a plan to eat healthy - take medicine as prescribed    Our next appointment is by telephone on April 2023  Please call the [place] at [phone] if you need to cancel or reschedule your appointment.   Please call the Suicide and Crisis Lifeline: 988 if you are experiencing a Mental Health or [place] or need someone to talk to.  The patient verbalized understanding of instructions, educational materials, and care plan provided today and agreed to receive a mailed copy of patient instructions,  educational materials, and care plan.   Telephone follow up appointment with care management team member scheduled for: The patient has been provided with contact information for the care management team and has been advised to call with any health related questions or concerns.   [place] Care Management [phone]

## 2021-01-26 ENCOUNTER — Other Ambulatory Visit: Payer: Self-pay

## 2021-01-26 MED ORDER — ISOSORBIDE MONONITRATE ER 30 MG PO TB24: 30.0000 mg | ORAL_TABLET | Freq: Every day | ORAL | 3 refills | Status: DC

## 2021-01-26 NOTE — Telephone Encounter (Signed)
Rx(s) sent to pharmacy electronically.  

## 2021-03-01 DIAGNOSIS — C61 Malignant neoplasm of prostate: Secondary | ICD-10-CM | POA: Diagnosis not present

## 2021-03-01 DIAGNOSIS — R351 Nocturia: Secondary | ICD-10-CM | POA: Diagnosis not present

## 2021-03-17 ENCOUNTER — Other Ambulatory Visit: Payer: Self-pay | Admitting: Internal Medicine

## 2021-03-17 MED ORDER — ATORVASTATIN CALCIUM 20 MG PO TABS: 20.0000 mg | ORAL_TABLET | Freq: Every day | ORAL | 1 refills | Status: DC

## 2021-03-17 NOTE — Telephone Encounter (Signed)
Prescription refill request for Eliquis received. ?Indication: Afib  ?Last office visit:01/22/21 Chalmers Cater) ?Scr: 1.26 (12/10/20) ?Age: 88 ?Weight: 84kg ? ?Appropriate dose and refill sent to requested pharmacy.  ?

## 2021-03-26 ENCOUNTER — Ambulatory Visit (INDEPENDENT_AMBULATORY_CARE_PROVIDER_SITE_OTHER): Payer: Medicare Other

## 2021-03-26 DIAGNOSIS — I4729 Other ventricular tachycardia: Secondary | ICD-10-CM | POA: Diagnosis not present

## 2021-03-29 LAB — CUP PACEART REMOTE DEVICE CHECK
Battery Remaining Longevity: 88 mo
Battery Remaining Percentage: 95.5 %
Battery Voltage: 3.01 V
Date Time Interrogation Session: 20230324020013
Implantable Lead Implant Date: 20030319
Implantable Lead Implant Date: 20030319
Implantable Lead Implant Date: 20110211
Implantable Lead Location: 753858
Implantable Lead Location: 753859
Implantable Lead Location: 753860
Implantable Lead Model: 158
Implantable Lead Model: 4087
Implantable Lead Serial Number: 113412
Implantable Lead Serial Number: 159867
Implantable Pulse Generator Implant Date: 20221223
Lead Channel Impedance Value: 430 Ohm
Lead Channel Impedance Value: 730 Ohm
Lead Channel Pacing Threshold Amplitude: 0.75 V
Lead Channel Pacing Threshold Amplitude: 1.125 V
Lead Channel Pacing Threshold Pulse Width: 0.5 ms
Lead Channel Pacing Threshold Pulse Width: 1 ms
Lead Channel Sensing Intrinsic Amplitude: 6.8 mV
Lead Channel Setting Pacing Amplitude: 2 V
Lead Channel Setting Pacing Amplitude: 2.5 V
Lead Channel Setting Pacing Pulse Width: 0.5 ms
Lead Channel Setting Pacing Pulse Width: 1 ms
Lead Channel Setting Sensing Sensitivity: 2 mV
Pulse Gen Model: 3222
Pulse Gen Serial Number: 3978258

## 2021-03-30 ENCOUNTER — Telehealth: Payer: Self-pay

## 2021-03-30 ENCOUNTER — Encounter: Payer: Self-pay | Admitting: Internal Medicine

## 2021-03-30 ENCOUNTER — Ambulatory Visit (INDEPENDENT_AMBULATORY_CARE_PROVIDER_SITE_OTHER): Payer: Medicare Other | Admitting: Internal Medicine

## 2021-03-30 ENCOUNTER — Other Ambulatory Visit: Payer: Self-pay

## 2021-03-30 VITALS — BP 112/66 | HR 70 | Ht 67.0 in | Wt 183.0 lb

## 2021-03-30 DIAGNOSIS — I4729 Other ventricular tachycardia: Secondary | ICD-10-CM | POA: Diagnosis not present

## 2021-03-30 DIAGNOSIS — Z950 Presence of cardiac pacemaker: Secondary | ICD-10-CM | POA: Diagnosis not present

## 2021-03-30 DIAGNOSIS — I472 Ventricular tachycardia, unspecified: Secondary | ICD-10-CM

## 2021-03-30 DIAGNOSIS — I2510 Atherosclerotic heart disease of native coronary artery without angina pectoris: Secondary | ICD-10-CM | POA: Diagnosis not present

## 2021-03-30 DIAGNOSIS — I5022 Chronic systolic (congestive) heart failure: Secondary | ICD-10-CM | POA: Diagnosis not present

## 2021-03-30 NOTE — Patient Instructions (Addendum)
Medication Instructions:  ?Your physician recommends that you continue on your current medications as directed. Please refer to the Current Medication list given to you today. ? ?Labwork: ?None ordered. ? ?Testing/Procedures: ?None ordered. ? ?Follow-Up: ?Your physician wants you to follow-up in: one year with Gregg Taylor, MD or one of the following Advanced Practice Providers on your designated Care Team:   ?Renee Ursuy, PA-C ?Michael "Andy" Tillery, PA-C ? ?Remote monitoring is used to monitor your Pacemaker from home. This monitoring reduces the number of office visits required to check your device to one time per year. It allows us to keep an eye on the functioning of your device to ensure it is working properly. You are scheduled for a device check from home on 06/25/2021. You may send your transmission at any time that day. If you have a wireless device, the transmission will be sent automatically. After your physician reviews your transmission, you will receive a postcard with your next transmission date. ? ?Any Other Special Instructions Will Be Listed Below (If Applicable). ? ?If you need a refill on your cardiac medications before your next appointment, please call your pharmacy.  ? ? ? ? ?

## 2021-03-30 NOTE — Telephone Encounter (Signed)
Pt is going to fax his Eliquis assistance form to this nurse. ? ?Will monitor for receipt of fax. ?

## 2021-03-30 NOTE — Progress Notes (Signed)
? ? ? ? ?HPI ?Mr. Cassis returns today for followup. He is a pleasant 88 yo man with a h/o chronic systolic heart failure and uncontrolled atrial fib who underwent AV node ablation about 3 months ago. He feels much better. He denies syncope or sob. He did think he felt better with a HR of 80 than with a HR of 70. He has not had chest pain. No edema. He has a remote h/o VT and is s/p ablation with no recurrent VT. He denies edema.  ?No Known Allergies ? ? ?Current Outpatient Medications  ?Medication Sig Dispense Refill  ? apixaban (ELIQUIS) 5 MG TABS tablet TAKE 1 TABLET BY MOUTH 2 TIMES DAILY. 60 tablet 5  ? atorvastatin (LIPITOR) 20 MG tablet Take 1 tablet (20 mg total) by mouth at bedtime. 90 tablet 1  ? carvedilol (COREG) 25 MG tablet TAKE 1 TABLET TWICE DAILY WITH A MEAL. 180 tablet 2  ? Cholecalciferol (VITAMIN D-3) 1000 UNITS CAPS Take 1,000 Units by mouth daily.     ? furosemide (LASIX) 40 MG tablet TAKE 1 TABLET BY MOUTH DAILY. HOLD DOSAGE IF WEIGHT < 183 POUNDS 90 tablet 2  ? indomethacin (INDOCIN SR) 75 MG CR capsule Take 75 mg by mouth daily as needed (gout attacks).    ? isosorbide mononitrate (IMDUR) 30 MG 24 hr tablet Take 1 tablet (30 mg total) by mouth daily. 90 tablet 3  ? losartan (COZAAR) 25 MG tablet TAKE 1/2 TABLET AT BEDTIME 45 tablet 3  ? omeprazole (PRILOSEC) 20 MG capsule TAKE 1 CAPSULE DAILY 90 capsule 3  ? phenylephrine (SUDAFED PE) 10 MG TABS tablet Take 10 mg by mouth every 6 (six) hours as needed (congestion).    ? spironolactone (ALDACTONE) 25 MG tablet TAKE 1/2 TABLET BY MOUTH ONCE A DAY. 45 tablet 3  ? Tamsulosin HCl (FLOMAX) 0.4 MG CAPS Take 0.4 mg by mouth daily after breakfast.     ? ?No current facility-administered medications for this visit.  ? ? ? ?Past Medical History:  ?Diagnosis Date  ? CAD (coronary artery disease)   ? CHF (congestive heart failure) ([place])   ? Dyslipidemia   ? History of ventricular fibrillation   ? HTN (hypertension)   ? Hx-sudden cardiac arrest   ?  Ischemic cardiomyopathy   ? LBBB (left bundle branch block)   ? Ventricular tachycardia   ? ? ?ROS: ? ? All systems reviewed and negative except as noted in the HPI. ? ? ?Past Surgical History:  ?Procedure Laterality Date  ? AV NODE ABLATION N/A 12/25/2020  ? Procedure: AV NODE ABLATION;  Surgeon: Evans Lance, MD;  Location: [place] [facility];  Service: Cardiovascular;  Laterality: N/A;  ? BIV PACEMAKER INSERTION CRT-P N/A 12/25/2020  ? Procedure: DOWNGRADE BIV PACEMAKER INSERTION CRT-P;  Surgeon: Evans Lance, MD;  Location: [facility];  Service: Cardiovascular;  Laterality: N/A;  ? CARDIAC CATHETERIZATION  10/12/2008  ? CARDIAC DEFIBRILLATOR PLACEMENT    ? St Jude  ? CARDIOVERSION N/A 06/28/2019  ? Procedure: CARDIOVERSION;  Surgeon: Lelon Perla, MD;  Location: [facility];  Service: Cardiovascular;  Laterality: N/A;  ? CARDIOVERSION N/A 02/14/2020  ? Procedure: CARDIOVERSION;  Surgeon: Sanda Klein, MD;  Location[facility];  Service: Cardiovascular;  Laterality: N/A;  ? DOPPLER ECHOCARDIOGRAPHY  2008, 2011  ? EP IMPLANTABLE DEVICE N/A 05/19/2014  ? Procedure: ICD/BIV ICD Generator Changeout;  Surgeon: Evans Lance, MD;  Location: [place] [facility];  Service: Cardiovascular;  Laterality: N/A;  ? TONSILECTOMY, ADENOIDECTOMY, BILATERAL MYRINGOTOMY AND TUBES    ? ? ? ?Family History  ?Problem Relation Age of Onset  ? Coronary artery disease Other   ? ? ? ?Social History  ? ?Socioeconomic History  ? Marital status: Divorced  ?  Spouse name: Not on file  ? Number of children: Not on file  ? Years of education: Not on file  ? Highest education level: Not on file  ?Occupational History  ? Occupation: Web designer  ?Tobacco Use  ? Smoking status: Former  ?  Types: Cigarettes  ?  Quit date: 01/03/1985  ?  Years since quitting: 36.2  ? Smokeless tobacco: Never  ?Vaping Use  ? Vaping Use: Never used  ?Substance and Sexual Activity  ? Alcohol use: Never  ? Drug use: Never  ? Sexual  activity: Not on file  ?Other Topics Concern  ? Not on file  ?Social History Narrative  ? Not on file  ? ?Social Determinants of Health  ? ?Financial Resource Strain: Not on file  ?Food Insecurity: No Food Insecurity  ? Worried About Charity fundraiser in the Last Year: Never true  ? Ran Out of Food in the Last Year: Never true  ?Transportation Needs: No Transportation Needs  ? Lack of Transportation (Medical): No  ? Lack of Transportation (Non-Medical): No  ?Physical Activity: Not on file  ?Stress: Not on file  ?Social Connections: Not on file  ?Intimate Partner Violence: Not on file  ? ? ? ?BP 112/66   Pulse 70   Ht '5\' 7"'$  (1.702 m)   Wt 183 lb (83 kg)   SpO2 96%   BMI 28.66 kg/m?  ? ?Physical Exam: ? ?Well appearing NAD ?HEENT: Unremarkable ?Neck:  6 cm JVD, no thyromegally ?Lymphatics:  No adenopathy ?Back:  No CVA tenderness ?Lungs:  Clear with no wheezes ?HEART:  Regular rate rhythm, no murmurs, no rubs, no clicks ?Abd:  soft, positive bowel sounds, no organomegally, no rebound, no guarding ?Ext:  2 plus pulses, no edema, no cyanosis, no clubbing ?Skin:  No rashes no nodules ?Neuro:  CN II through XII intact, motor grossly intact ? ?EKG - atrial fib with biv pacing ? ?DEVICE  ?Normal device function.  See PaceArt for details.  ? ?Assess/Plan:  ?Uncontrolled atrial fib - he is s/p AV node ablation and is doing well. No change in meds. ?ICD - He is s/p down grade and his St. Jude biv PPM is working normally. We will recheck in several months. ?VT - he is s/p remote ablation and is doing well. We will follow. ?Chronic systolic heart failure - his symptoms are back to class 2 since his VR is controlled. ? ?Carleene Overlie Margarete Horace,MD ?

## 2021-03-31 NOTE — Progress Notes (Signed)
Remote pacemaker transmission.   

## 2021-04-26 ENCOUNTER — Other Ambulatory Visit: Payer: Self-pay | Admitting: *Deleted

## 2021-04-26 NOTE — Patient Outreach (Signed)
[place]) Care Management ?RN Health Coach Note ? ? ?04/26/2021 ?Name:  Mark Nguyen MRN:  [id number] DOB:  1931-04-09 ? ?Summary: ?Per patient decrease shortness of breath since ablation. Patient has not felt any heart irregularities. He is having intermittent knee pain. Gait is unsteady. No recent falls. Patient uses a walker or cane to ambulate. Per patient appetite is good.  ? ?Recommendations/Changes made from today's visit: ?Schedule eye exam ?Medication adherence ?Monitor sodium in diet ? ? ?Subjective: ?Mark Nguyen is an 88 y.o. year old male who is a primary patient of [place], Charisse March, MD. The care management team was consulted for assistance with care management and/or care coordination needs.   ? ?RN Health Coach completed Telephone Visit today.  ? ?Objective: ? ?Medications Reviewed Today   ? ? Reviewed by Guido Sander, [place] (Certified Medical Assistant) on 03/30/21 at 36  Med List Status: <None>  ? ?Medication Order Taking? Sig Documenting Provider Last Dose Status Informant  ?apixaban (ELIQUIS) 5 MG TABS tablet [id number] Yes TAKE 1 TABLET BY MOUTH 2 TIMES DAILY. Evans Lance, MD Taking Active   ?atorvastatin (LIPITOR) 20 MG tablet [id number] Yes Take 1 tablet (20 mg total) by mouth at bedtime. Evans Lance, MD Taking Active   ?carvedilol (COREG) 25 MG tablet [id number] Yes TAKE 1 TABLET TWICE DAILY WITH A MEAL. Evans Lance, MD Taking Active Self  ?Cholecalciferol (VITAMIN D-3) 1000 UNITS CAPS [id number] Yes Take 1,000 Units by mouth daily.  [provider] Taking Active Self  ?furosemide (LASIX) 40 MG tablet [id number] Yes TAKE 1 TABLET BY MOUTH DAILY. HOLD DOSAGE IF WEIGHT < 183 POUNDS Evans Lance, MD Taking Active Self  ?indomethacin (INDOCIN SR) 75 MG CR capsule [id number] Yes Take 75 mg by mouth daily as needed (gout attacks). [provider] Taking Active Self  ?isosorbide mononitrate (IMDUR) 30 MG 24 hr tablet [id number] Yes Take 1 tablet (30  mg total) by mouth daily. Evans Lance, MD Taking Active   ?losartan (COZAAR) 25 MG tablet [id number] Yes TAKE 1/2 TABLET AT BEDTIME Evans Lance, MD Taking Active Self  ?omeprazole (PRILOSEC) 20 MG capsule [id number] Yes TAKE 1 CAPSULE DAILY Evans Lance, MD Taking Active Self  ?phenylephrine (SUDAFED PE) 10 MG TABS tablet [id number] Yes Take 10 mg by mouth every 6 (six) hours as needed (congestion). [provider] Taking Active Self  ?spironolactone (ALDACTONE) 25 MG tablet [id number] Yes TAKE 1/2 TABLET BY MOUTH ONCE A DAY. Evans Lance, MD Taking Active Self  ?Tamsulosin HCl (FLOMAX) 0.4 MG CAPS [id number] Yes Take 0.4 mg by mouth daily after breakfast.  [provider] Taking Active Self  ? ?  ?  ? ?  ? ? ? ?SDOH:  (Social Determinants of Health) assessments and interventions performed:  ?SDOH Interventions   ? ?Flowsheet Row Most Recent Value  ?SDOH Interventions   ?Food Insecurity Interventions Intervention Not Indicated  ?Transportation Interventions Intervention Not Indicated  ? ?  ? ? ?Care Plan ? ?Review of patient past medical history, allergies, medications, health status, including review of consultants reports, laboratory and other test data, was performed as part of comprehensive evaluation for care management services.  ? ?Care Plan : RN Care Manager Plan of Care  ?Updates made by Kinzly Pierrelouis, Eppie Gibson, RN since 04/26/2021 12:00 AM  ?  ? ?Problem: Knowledge Deficit Related to Atrial Fibrillation and Care Coordination Needs   ?Priority: High  ?  ? ?Long-Range Goal: Social worker  of Care fo Management of Atrial Fibrillation   ?Start Date: 01/25/2021  ?Expected End Date: 01/01/2022  ?Priority: High  ?Note:   ?Current Barriers:  ?Knowledge Deficits related to plan of care for management of Atrial Fibrillation  ? ?RNCM Clinical Goal(s):  ?Patient will verbalize understanding of plan for management of Atrial Fibrillation as evidenced by continuation of monitoring pulse and adhering  to low sodium diet  through collaboration with RN Care manager, provider, and care team.  ? ?Interventions: ?Inter-disciplinary care team collaboration (see longitudinal plan of care) ?Evaluation of current treatment plan related to  self management and patient's adherence to plan as established by provider ? ? ?Patient Goals/Self-Care Activities: ?Take medications as prescribed   ?Attend all scheduled provider appointments ?Call pharmacy for medication refills 3-7 days in advance of running out of medications ?Attend church or other social activities ?Perform all self care activities independently  ?Perform IADL's (shopping, preparing meals, housekeeping, managing finances) independently ?Call provider office for new concerns or questions  ?call the Suicide and Crisis Lifeline: 988 if experiencing a Mental Health or [place]  ?- check pulse (heart) rate once a day ?- make a plan to eat healthy ?- take medicine as prescribed ? [id number] Per patient he has not felt any heart irregularities. He stated that his shortness of breath has decreased since he had the ablation. Per patient he still drives. He is having intermittent knee pain. His gait is unsteady. He ambulates with a walker or a cane.  ?  ?  ? ?Plan: Telephone follow up appointment with care management team member scheduled for:  July 27, 2021 ?The patient has been provided with contact information for the care management team and has been advised to call with any health related questions or concerns.  ? ?Johny Shock BSN RN ?[place] Management ?[phone] ? ? ? ?  ?

## 2021-04-26 NOTE — Patient Instructions (Signed)
Visit Information ? ?Thank you for taking time to visit with me today. Please don't hesitate to contact me if I can be of assistance to you before our next scheduled telephone appointment. ? ?Following are the goals we discussed today:  ?Current Barriers:  ?Knowledge Deficits related to plan of care for management of Atrial Fibrillation  ? ?RNCM Clinical Goal(s):  ?Patient will verbalize understanding of plan for management of Atrial Fibrillation as evidenced by continuation of monitoring pulse and adhering to low sodium diet through collaboration with RN Care manager, provider, and care team.  ? ?Interventions: ?Inter-disciplinary care team collaboration (see longitudinal plan of care) ?Evaluation of current treatment plan related to  self management and patient's adherence to plan as established by provider ? ? ?Patient Goals/Self-Care Activities: ?Take medications as prescribed   ?Attend all scheduled provider appointments ?Call pharmacy for medication refills 3-7 days in advance of running out of medications ?Attend church or other social activities ?Perform all self care activities independently  ?Perform IADL's (shopping, preparing meals, housekeeping, managing finances) independently ?Call provider office for new concerns or questions  ?call the Suicide and Crisis Lifeline: 988 if experiencing a Mental Health or [place]  ?- check pulse (heart) rate once a day ?- make a plan to eat healthy ?- take medicine as prescribed ? [id number] Per patient he has not felt any heart irregularities. He stated that his shortness of breath has decreased since he had the ablation. Per patient he still drives. He is having intermittent knee pain. His gait is unsteady. He ambulates with a walker or a cane.  ? ?Our next appointment is by telephone on July 27, 2021 ? ?Please call Johny Shock RN [phone] if you need to cancel or reschedule your appointment.  ? ?Please call the Suicide and Crisis Lifeline:  988 if you are experiencing a Mental Health or [place] or need someone to talk to. ? ?The patient verbalized understanding of instructions, educational materials, and care plan provided today and agreed to receive a mailed copy of patient instructions, educational materials, and care plan.  ? ?Telephone follow up appointment with care management team member scheduled for: ?The patient has been provided with contact information for the care management team and has been advised to call with any health related questions or concerns.  ? ?SIGNATURE ? ?Johny Shock BSN RN ?[place] Management ?[phone] ? ? ? ? ?

## 2021-05-14 ENCOUNTER — Other Ambulatory Visit: Payer: Self-pay | Admitting: Internal Medicine

## 2021-05-14 DIAGNOSIS — I5022 Chronic systolic (congestive) heart failure: Secondary | ICD-10-CM

## 2021-05-18 ENCOUNTER — Telehealth: Payer: Self-pay | Admitting: Internal Medicine

## 2021-05-18 NOTE — Telephone Encounter (Signed)
Dr. Tanna Furry nurse has received fax and sent it to our pre-auth nurse. Will send message to her to follow up.  ? ?Tried to call patient back, left message.  ?

## 2021-05-18 NOTE — Telephone Encounter (Signed)
Called patient to let him know we received fax and sent to pre auth nurse. ?

## 2021-05-18 NOTE — Telephone Encounter (Signed)
Pt calling to f/u on paperwork that was faxed over regarding Pt Assistance. Pt would like a callback with update. Please advise ?

## 2021-05-18 NOTE — Telephone Encounter (Signed)
Izora Gala a close friend of the patient is calling stating she faxed over the [place] [employer] forms to [phone] today and is wanting to confirm it was received.  ?

## 2021-05-19 NOTE — Telephone Encounter (Signed)
**Note De-Identified Arinze Rivadeneira Obfuscation** I have completed the providers page of the pts BMSPAF application for Eliquis and have e-mailed all to Dr Forde Dandy nurse so she can obtain his signature, date it and to fax all to [facility] at the fax number written on the cover letter included. ?

## 2021-05-24 ENCOUNTER — Other Ambulatory Visit: Payer: Self-pay | Admitting: Internal Medicine

## 2021-05-25 NOTE — Telephone Encounter (Signed)
**Note De-Identified [name] Obfuscation** Letter received Mark Nguyen fax from [employer] stating that they have approved the pt for Eliquis asst until 01/02/2022. Case #: [id number]  The letter states that they have notified the pt of this approval as well.

## 2021-05-27 ENCOUNTER — Other Ambulatory Visit: Payer: Self-pay | Admitting: *Deleted

## 2021-05-27 NOTE — Patient Outreach (Signed)
[facility]) Care Management  05/27/2021  Mark Nguyen 03/16/1931 [id number]  RN Health Coach received  telephone call from patient.  [employer] compliance verified. Per patient he is hurting in his back. He stated he has a recent weight loss. He has a new Dr and had not seen him yet. He did not know how to get in touch with them. He stated he stopped his fluid pill because he thought it could be due to that. He states he drinks plenty of water.  Plan:  RN Health Coach contacted Dr office and made appt for tomorrow at 9:15 RN made patient aware of appt and gave patient Dr office number  [facility] [phone]

## 2021-05-28 ENCOUNTER — Ambulatory Visit (INDEPENDENT_AMBULATORY_CARE_PROVIDER_SITE_OTHER): Payer: Medicare Other | Admitting: Student

## 2021-05-28 VITALS — BP 119/68 | HR 80 | Temp 97.8°F | Ht 67.0 in | Wt 188.8 lb

## 2021-05-28 DIAGNOSIS — Z87891 Personal history of nicotine dependence: Secondary | ICD-10-CM | POA: Diagnosis not present

## 2021-05-28 DIAGNOSIS — I5042 Chronic combined systolic (congestive) and diastolic (congestive) heart failure: Secondary | ICD-10-CM | POA: Diagnosis not present

## 2021-05-28 DIAGNOSIS — I5022 Chronic systolic (congestive) heart failure: Secondary | ICD-10-CM

## 2021-05-28 DIAGNOSIS — M545 Low back pain, unspecified: Secondary | ICD-10-CM | POA: Diagnosis not present

## 2021-05-28 DIAGNOSIS — I2510 Atherosclerotic heart disease of native coronary artery without angina pectoris: Secondary | ICD-10-CM

## 2021-05-28 NOTE — Patient Instructions (Addendum)
Back Pain Take tylenol to help with back pain You can also use lidocaine  patches to help with pain I will send a referral to physical therapy   Heart failure Keep check you weight daily if your weight increased above 183 restart your lasix If you develop worsening shortness of breath, swelling of breath, worsening weigh gain please return to clinic

## 2021-05-29 LAB — BMP8+ANION GAP
Anion Gap: 15 mmol/L (ref 10.0–18.0)
BUN/Creatinine Ratio: 20 (ref 10–24)
BUN: 18 mg/dL (ref 8–27)
CO2: 21 mmol/L (ref 20–29)
Calcium: 9.2 mg/dL (ref 8.6–10.2)
Chloride: 104 mmol/L (ref 96–106)
Creatinine, Ser: 0.9 mg/dL (ref 0.76–1.27)
Glucose: 113 mg/dL — ABNORMAL HIGH (ref 70–99)
Potassium: 4.6 mmol/L (ref 3.5–5.2)
Sodium: 140 mmol/L (ref 134–144)
eGFR: 83 mL/min/{1.73_m2} (ref >59)

## 2021-06-01 DIAGNOSIS — M545 Low back pain, unspecified: Secondary | ICD-10-CM

## 2021-06-01 HISTORY — DX: Low back pain, unspecified: M54.50

## 2021-06-01 NOTE — Assessment & Plan Note (Addendum)
Patient reports concern about dropping weight with his furosemide. He currently takes 40 mg daily. Appears dry weight is 183 lbs per cardiology notes. He notes weight at home is around 179 and he has been holding off on taking his diuretic medication. Feels that pants are looser and need to tighten his belt more.  He denies orthopnea or worsening lower extremity edema. On exam to day he has gained about 5 lbs in the last 2 months. On exam does have LE edema to mid thigh but lungs are clear without JVD. Suspect he is close to euvolemic and need to hold diuretic and monitor home weights. Weight loss may also be due to loss of muscle mass rather than fluid and may have a lower dry weight, however weight is elevated today in office. Will have him monitor home weights and have him follow up in clinic adjust diuretic dosing.  Hold Lasix until weight above 183 on home scale BMP today with improved Cr and stable electrolytes  Return precautions given for HF symptoms  Follow up in clinic in 1 month

## 2021-06-01 NOTE — Assessment & Plan Note (Signed)
Patient reports back pain for the past 3 week. 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 States pain is worse with standing straight and improves with flexing his back. He ambulates with a walker at home. He has been going out of the house less due to the pain. No red flag symptoms. He does live by him self and completes ADLs and IADLs independently. Feels this has been more difficult due to his back pain. Suspect lumbar paravertebral muscular strain. Discussed tylenol and heat pad to help with pain. Will refer to PT as well given possibly safety issues with mobility. Patient expresses concern with cost as he is on a fixed income. Discussed likely will be covered by insurance and will have him call clinic if this becomes and issue.

## 2021-06-01 NOTE — Progress Notes (Signed)
Established Patient Office Visit  Subjective   Patient ID: Mark Nguyen, male    DOB: [birth year]  Age: 88 y.o. MRN: [id number]  Chief Complaint  Patient presents with  . Back Pain    Mark Nguyen is a 88 year old who presents today for back pain that started 3 weeks ago as well as weight loss in the setting of diuretic use.    Patient Active Problem List   Diagnosis Date Noted  . Lumbar back pain 06/01/2021  . Biventricular cardiac pacemaker in situ 03/30/2021  . Encounter for cardioversion procedure   . At risk for obstructive sleep apnea 11/23/2018  . Hyperglycemia 11/22/2018  . Acute respiratory failure with hypoxia ([place]) 11/16/2018  . Iron deficiency anemia 11/16/2018  . Chronic combined systolic (congestive) and diastolic (congestive) heart failure ([place]) 11/15/2018  . Atrial fibrillation ([place]) 01/18/2011  . CARDIOVASCULAR FUNCTION STUDY, ABNORMAL 10/29/2008  . SYSTOLIC HEART FAILURE, CHRONIC 06/09/2008  . DYSLIPIDEMIA 03/25/2008  . Essential hypertension 03/25/2008  . Coronary atherosclerosis 03/25/2008  . ISCHEMIC CARDIOMYOPATHY 03/25/2008  . LBBB 03/25/2008  . VENTRICULAR TACHYCARDIA 03/25/2008  . VENTRICULAR FIBRILLATION 03/25/2008  . PERSONAL HISTORY OF SUDDEN CARDIAC ARREST 03/25/2008  . Automatic implantable cardioverter-defibrillator in situ 03/25/2008      Review of Systems  Respiratory:  Negative for shortness of breath and wheezing.   Cardiovascular:  Negative for orthopnea and leg swelling.  All other systems reviewed and are negative.    Objective:     BP 119/68 (BP Location: Left Arm, Patient Position: Sitting, Cuff Size: Small)   Pulse 80   Temp 97.8 F (36.6 C) (Oral)   Ht '5\' 7"'  (1.702 m)   Wt 188 lb 12.8 oz (85.6 kg)   SpO2 98%   BMI 29.57 kg/m  BP Readings from Last 3 Encounters:  05/28/21 119/68  03/30/21 112/66  01/22/21 120/82      Physical Exam Constitutional:      General: He is not in acute distress.    Appearance:  Normal appearance.  HENT:     Mouth/Throat:     Mouth: Mucous membranes are moist.     Pharynx: Oropharynx is clear.  Eyes:     Extraocular Movements: Extraocular movements intact.     Pupils: Pupils are equal, round, and reactive to light.  Cardiovascular:     Rate and Rhythm: Normal rate and regular rhythm.     Pulses: Normal pulses.     Heart sounds: No murmur heard.   No friction rub. No gallop.     Comments: JVP at 7 cm Pulmonary:     Effort: Pulmonary effort is normal.     Breath sounds: Normal breath sounds. No rhonchi or rales.  Abdominal:     General: Abdomen is flat. Bowel sounds are normal. There is no distension.     Palpations: Abdomen is soft.  Musculoskeletal:     Comments: 2+ pitting edema bilaterally to the mid thighs  Skin:    General: Skin is warm and dry.     Capillary Refill: Capillary refill takes less than 2 seconds.     Comments: Normal skin turgor  Neurological:     General: No focal deficit present.     Mental Status: He is alert and oriented to person, place, and time.     Results for orders placed or performed in visit on 05/28/21  BMP8+Anion Gap  Result Value Ref Range   Glucose 113 (H) 70 - 99 mg/dL   BUN  18 8 - 27 mg/dL   Creatinine, Ser 0.90 0.76 - 1.27 mg/dL   eGFR 83 >59 mL/min/1.73   BUN/Creatinine Ratio 20 10 - 24   Sodium 140 134 - 144 mmol/L   Potassium 4.6 3.5 - 5.2 mmol/L   Chloride 104 96 - 106 mmol/L   CO2 21 20 - 29 mmol/L   Anion Gap 15.0 10.0 - 18.0 mmol/L   Calcium 9.2 8.6 - 10.2 mg/dL    Last metabolic panel Lab Results  Component Value Date   GLUCOSE 113 (H) 05/28/2021   NA 140 05/28/2021   K 4.6 05/28/2021   CL 104 05/28/2021   CO2 21 05/28/2021   BUN 18 05/28/2021   CREATININE 0.90 05/28/2021   EGFR 83 05/28/2021   CALCIUM 9.2 05/28/2021   PHOS 2.8 11/15/2018   PROT 6.2 03/12/2020   ALBUMIN 4.0 03/12/2020   LABGLOB 2.1 03/18/2019   AGRATIO 2.1 03/18/2019   BILITOT 0.7 03/12/2020   ALKPHOS 80 03/12/2020    AST 23 03/12/2020   ALT 20 03/12/2020   ANIONGAP 7 11/28/2018      The ASCVD Risk score (Arnett DK, et al., 2019) failed to calculate for the following reasons:   The 2019 ASCVD risk score is only valid for ages 69 to 86    Assessment & Plan:   Problem List Items Addressed This Visit       Cardiovascular and Mediastinum   SYSTOLIC HEART FAILURE, CHRONIC - Primary   Relevant Orders   BMP8+Anion Gap (Completed)   Chronic combined systolic (congestive) and diastolic (congestive) heart failure ([place])    Patient reports concern about dropping weight with his furosemide. He currently takes 40 mg daily. Appears dry weight is 183 lbs per cardiology notes. He notes weight at home is around 179 and he has been holding off on taking his diuretic medication. Feels that pants are looser and need to tighten his belt more.  He denies orthopnea or worsening lower extremity edema. On exam to day he has gained about 5 lbs in the last 2 months. On exam does have LE edema to mid thigh but lungs are clear without JVD.   Hold Lasix until weight above 183 on home scale BMP today with improved Cr and stable electrolytes  Return precautions given for HF symptoms  Follow up in clinic in 1 month         Other   Lumbar back pain    Patient reports back pain for the past 3 week. 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 States pain is worse with standing straight and improves with flexing his back. He ambulates with a walker at home. He has been going out of the house less due to the pain. No red flag symptoms. He does live by him self and completes ADLs and IADLs independently. Feels this has been more difficult due to his back pain. Suspect lumbar paravertebral muscular strain. Discussed tylenol and heat pad to help with pain. Will refer to PT as well given possibly safety issues with mobility. Patient expresses concern with cost as he is on a fixed income.  Discussed likely will be covered by insurance and will have him call clinic if this becomes and issue.        Other Visit Diagnoses     Acute right-sided low back pain without sciatica       Relevant Orders   Ambulatory referral to Physical Therapy  Return in about 1 month (around 06/28/2021).    Iona Beard, MD

## 2021-06-02 DIAGNOSIS — C61 Malignant neoplasm of prostate: Secondary | ICD-10-CM | POA: Diagnosis not present

## 2021-06-02 DIAGNOSIS — R351 Nocturia: Secondary | ICD-10-CM | POA: Diagnosis not present

## 2021-06-09 NOTE — Progress Notes (Signed)
Internal Medicine Clinic Attending ? ?Case discussed with Dr. Liang  At the time of the visit.  We reviewed the resident?s history and exam and pertinent patient test results.  I agree with the assessment, diagnosis, and plan of care documented in the resident?s note. ? ?

## 2021-06-15 DIAGNOSIS — N3289 Other specified disorders of bladder: Secondary | ICD-10-CM | POA: Diagnosis not present

## 2021-06-25 ENCOUNTER — Ambulatory Visit (INDEPENDENT_AMBULATORY_CARE_PROVIDER_SITE_OTHER): Payer: Medicare Other

## 2021-06-25 DIAGNOSIS — I428 Other cardiomyopathies: Secondary | ICD-10-CM

## 2021-06-27 NOTE — Progress Notes (Signed)
   CC: CHF follow up  HPI:  Mark Nguyen is a 88 y.o. with medical history as below presenting to [facility] for CHF follow up.   Please see problem-based list for further details, assessments, and plans.  Past Medical History:  Diagnosis Date   CAD (coronary artery disease)    CHF (congestive heart failure) (HCC)    Dyslipidemia    History of ventricular fibrillation    HTN (hypertension)    Hx-sudden cardiac arrest    Ischemic cardiomyopathy    LBBB (left bundle branch block)    Ventricular tachycardia (HCC)      Current Outpatient Medications (Cardiovascular):    atorvastatin (LIPITOR) 20 MG tablet, Take 1 tablet (20 mg total) by mouth at bedtime.   carvedilol (COREG) 25 MG tablet, TAKE 1 TABLET BY MOUTH TWICE DAILY WITH A MEAL   furosemide (LASIX) 40 MG tablet, TAKE 1 TABLET BY MOUTH DAILY. HOLD DOSAGE IF WEIGHT < 183 POUNDS   isosorbide mononitrate (IMDUR) 30 MG 24 hr tablet, Take 1 tablet (30 mg total) by mouth daily.   losartan (COZAAR) 25 MG tablet, TAKE 1/2 TABLET AT BEDTIME   spironolactone (ALDACTONE) 25 MG tablet, TAKE 1/2 TABLET BY MOUTH ONCE A DAY.  Current Outpatient Medications (Respiratory):    phenylephrine (SUDAFED PE) 10 MG TABS tablet, Take 10 mg by mouth every 6 (six) hours as needed (congestion).  Current Outpatient Medications (Analgesics):    indomethacin (INDOCIN SR) 75 MG CR capsule, Take 75 mg by mouth daily as needed (gout attacks).  Current Outpatient Medications (Hematological):    apixaban (ELIQUIS) 5 MG TABS tablet, TAKE 1 TABLET BY MOUTH 2 TIMES DAILY.  Current Outpatient Medications (Other):    Cholecalciferol (VITAMIN D-3) 1000 UNITS CAPS, Take 1,000 Units by mouth daily.    omeprazole (PRILOSEC) 20 MG capsule, TAKE 1 CAPSULE DAILY   Tamsulosin HCl (FLOMAX) 0.4 MG CAPS, Take 0.4 mg by mouth daily after breakfast.   Review of Systems:  Review of system negative unless stated in the problem list or HPI.    Physical Exam:  Vitals:    06/28/21 0942  BP: 118/64  Pulse: 79  Temp: 97.8 F (36.6 C)  TempSrc: Oral  SpO2: 99%  Weight: 180 lb 6.4 oz (81.8 kg)  Height: 5\' 7"  (1.702 m)    Physical Exam General: NAD HENT: NCAT Lungs: CTAB, no wheeze, rhonchi or rales.  Cardiovascular: Normal heart sounds, no r/m/g, 2+ pulses in all extremities. No LE edema Abdomen: No TTP, normal bowel sounds MSK: tophi on bilateral hands.  TTP on back.  Skin: no lesions noted on exposed skin Neuro: Alert and oriented x4. CN grossly intact Psych: Normal mood and normal affect   Assessment & Plan:   See Encounters Tab for problem based charting.  Patient discussed with Dr. Thersa Salt, MD

## 2021-06-28 ENCOUNTER — Ambulatory Visit (INDEPENDENT_AMBULATORY_CARE_PROVIDER_SITE_OTHER): Payer: Medicare Other | Admitting: Internal Medicine

## 2021-06-28 ENCOUNTER — Encounter: Payer: Self-pay | Admitting: Internal Medicine

## 2021-06-28 ENCOUNTER — Ambulatory Visit (INDEPENDENT_AMBULATORY_CARE_PROVIDER_SITE_OTHER): Payer: Medicare Other

## 2021-06-28 ENCOUNTER — Other Ambulatory Visit: Payer: Self-pay

## 2021-06-28 VITALS — BP 118/64 | HR 79 | Temp 97.8°F | Ht 67.0 in | Wt 180.4 lb

## 2021-06-28 DIAGNOSIS — I2510 Atherosclerotic heart disease of native coronary artery without angina pectoris: Secondary | ICD-10-CM

## 2021-06-28 DIAGNOSIS — M545 Low back pain, unspecified: Secondary | ICD-10-CM | POA: Diagnosis not present

## 2021-06-28 DIAGNOSIS — I110 Hypertensive heart disease with heart failure: Secondary | ICD-10-CM

## 2021-06-28 DIAGNOSIS — Z0000 Encounter for general adult medical examination without abnormal findings: Secondary | ICD-10-CM | POA: Diagnosis not present

## 2021-06-28 DIAGNOSIS — I10 Essential (primary) hypertension: Secondary | ICD-10-CM | POA: Diagnosis not present

## 2021-06-28 DIAGNOSIS — I5042 Chronic combined systolic (congestive) and diastolic (congestive) heart failure: Secondary | ICD-10-CM

## 2021-06-28 DIAGNOSIS — Z87891 Personal history of nicotine dependence: Secondary | ICD-10-CM | POA: Diagnosis not present

## 2021-06-28 NOTE — Progress Notes (Signed)
Subjective:   Mark Nguyen is a 88 y.o. male who presents for an Initial Medicare Annual Wellness Visit. I connected with  Mark Nguyen on 06/28/21 by a  IN PERSON [place] .    Patient Location: Other:  [place]   Provider Location: Office/Clinic  I discussed the limitations of evaluation and management by telemedicine. The patient expressed understanding and agreed to proceed.   Review of Systems    DEFERRED TO PCP        Objective:    There were no vitals filed for this visit. There is no height or weight on file to calculate BMI.     06/28/2021    9:51 AM 05/28/2021    9:31 AM 06/28/2019    9:27 AM 02/19/2019    3:33 PM 12/07/2018   11:03 AM 11/28/2018    9:11 AM 11/22/2018    1:44 PM  Advanced Directives  Does Patient Have a Medical Advance Directive? No No No No No No No  Would patient like information on creating a medical advance directive? No - Patient declined No - Patient declined No - Patient declined No - Patient declined No - Patient declined Yes (ED - Information included in AVS) Yes (MAU/Ambulatory/Procedural Areas - Information given)    Current Medications (verified) Outpatient Encounter Medications as of 06/28/2021  Medication Sig   apixaban (ELIQUIS) 5 MG TABS tablet TAKE 1 TABLET BY MOUTH 2 TIMES DAILY.   atorvastatin (LIPITOR) 20 MG tablet Take 1 tablet (20 mg total) by mouth at bedtime.   carvedilol (COREG) 25 MG tablet TAKE 1 TABLET BY MOUTH TWICE DAILY WITH A MEAL   Cholecalciferol (VITAMIN D-3) 1000 UNITS CAPS Take 1,000 Units by mouth daily.    furosemide (LASIX) 40 MG tablet TAKE 1 TABLET BY MOUTH DAILY. HOLD DOSAGE IF WEIGHT < 183 POUNDS   indomethacin (INDOCIN SR) 75 MG CR capsule Take 75 mg by mouth daily as needed (gout attacks).   isosorbide mononitrate (IMDUR) 30 MG 24 hr tablet Take 1 tablet (30 mg total) by mouth daily.   losartan (COZAAR) 25 MG tablet TAKE 1/2 TABLET AT BEDTIME   omeprazole (PRILOSEC) 20 MG capsule TAKE 1 CAPSULE  DAILY   phenylephrine (SUDAFED PE) 10 MG TABS tablet Take 10 mg by mouth every 6 (six) hours as needed (congestion).   spironolactone (ALDACTONE) 25 MG tablet TAKE 1/2 TABLET BY MOUTH ONCE A DAY.   Tamsulosin HCl (FLOMAX) 0.4 MG CAPS Take 0.4 mg by mouth daily after breakfast.    No facility-administered encounter medications on file as of 06/28/2021.    Allergies (verified) Patient has no known allergies.   History: Past Medical History:  Diagnosis Date   CAD (coronary artery disease)    CHF (congestive heart failure) (HCC)    Dyslipidemia    History of ventricular fibrillation    HTN (hypertension)    Hx-sudden cardiac arrest    Ischemic cardiomyopathy    LBBB (left bundle branch block)    Ventricular tachycardia (Mark Nguyen)    Past Surgical History:  Procedure Laterality Date   AV NODE ABLATION N/A 12/25/2020   Procedure: AV NODE ABLATION;  Surgeon: Mark Lance, MD;  Location: [facility];  Service: Cardiovascular;  Laterality: N/A;   BIV PACEMAKER INSERTION CRT-P N/A 12/25/2020   Procedure: DOWNGRADE BIV PACEMAKER INSERTION CRT-P;  Surgeon: Mark Lance, MD;  Location: [facility];  Service: Cardiovascular;  Laterality: N/A;   CARDIAC CATHETERIZATION  10/12/2008  CARDIAC DEFIBRILLATOR PLACEMENT     St Jude   CARDIOVERSION N/A 06/28/2019   Procedure: CARDIOVERSION;  Surgeon: Mark Perla, MD;  Location: [facility];  Service: Cardiovascular;  Laterality: N/A;   CARDIOVERSION N/A 02/14/2020   Procedure: CARDIOVERSION;  Surgeon: Mark Klein, MD;  Location: [facility];  Service: Cardiovascular;  Laterality: N/A;   DOPPLER ECHOCARDIOGRAPHY  2008, 2011   EP IMPLANTABLE DEVICE N/A 05/19/2014   Procedure: ICD/BIV ICD Generator Changeout;  Surgeon: Mark Lance, MD;  Location: [facility];  Service: Cardiovascular;  Laterality: N/A;   TONSILECTOMY, ADENOIDECTOMY, BILATERAL MYRINGOTOMY AND TUBES     Family History  Problem Relation Age of Onset    Coronary artery disease Other    Social History   Socioeconomic History   Marital status: Divorced    Spouse name: Not on file   Number of children: Not on file   Years of education: Not on file   Highest education level: Not on file  Occupational History   Occupation: furniture truck driver  Tobacco Use   Smoking status: Former    Types: Cigarettes    Quit date: 01/03/1985    Years since quitting: 36.5   Smokeless tobacco: Never  Vaping Use   Vaping Use: Never used  Substance and Sexual Activity   Alcohol use: Never   Drug use: Never   Sexual activity: Not on file  Other Topics Concern   Not on file  Social History Narrative   Not on file   Social Determinants of Health   Financial Resource Strain: Not on file  Food Insecurity: No Food Insecurity (04/26/2021)   Hunger Vital Sign    Worried About Running Out of Food in the Last Year: Never true    Ran Out of Food in the Last Year: Never true  Transportation Needs: No Transportation Needs (04/26/2021)   PRAPARE - Hydrologist (Medical): No    Lack of Transportation (Non-Medical): No  Physical Activity: Not on file  Stress: Not on file  Social Connections: Not on file    Tobacco Counseling Counseling given: Not Answered   Clinical Intake:                 Diabetic?NO          Activities of Daily Living    06/28/2021    9:49 AM 05/28/2021    9:31 AM  In your present state of health, do you have any difficulty performing the following activities:  Hearing? 1 1  Vision? 0 0  Difficulty concentrating or making decisions? 0 0  Walking or climbing stairs? 1 0  Dressing or bathing? 0 0  Doing errands, shopping? 1 0    Patient Care Team: Mark Axon, MD as PCP - General Mark Lance, MD as PCP - Electrophysiology (Cardiology) Mark Friar, PA-C as Physician Assistant (Physician Assistant) Mark Axon, MD as Resident (Internal  Medicine) Mark Nguyen, Mark Gibson, RN as [place] any recent [place] you may have received from other than [facility] providers in the past year (date may be approximate).     Assessment:   This is a routine wellness examination for Mark Nguyen.  Hearing/Vision screen No results found.  Dietary issues and exercise activities discussed:     Goals Addressed   None   Depression Screen    06/28/2021    9:48 AM 05/28/2021    9:32 AM 10/27/2020  10:31 AM 09/10/2019   12:41 PM 06/10/2019   12:30 PM 03/06/2019    4:25 PM 02/19/2019    3:30 PM  PHQ 2/9 Scores  PHQ - 2 Score 0 0 0 0 0 0 0    Fall Risk    06/28/2021    9:48 AM 05/28/2021    9:21 AM 06/10/2019   12:17 PM 02/19/2019    3:34 PM 11/28/2018    8:56 AM  Fall Risk   Falls in the past year? 1 0 0 0 0  Number falls in past yr: 0 0 0 0   Injury with Fall? 0 0 0 0   Risk for fall due to : Impaired balance/gait    Impaired balance/gait  Follow up Falls evaluation completed;Falls prevention discussed Falls evaluation completed Falls evaluation completed;Falls prevention discussed Falls evaluation completed;Falls prevention discussed     FALL RISK PREVENTION PERTAINING TO THE HOME:  Any stairs in or around the home? Yes  If so, are there any without handrails? No  Home free of loose throw rugs in walkways, pet beds, electrical cords, etc? No  Adequate lighting in your home to reduce risk of falls? Yes   ASSISTIVE DEVICES UTILIZED TO PREVENT FALLS:  Life alert? No  Use of a cane, walker or w/c? Yes  Grab bars in the bathroom? Yes  Shower chair or bench in shower? Yes  Elevated toilet seat or a handicapped toilet? No   TIMED UP AND GO:  Was the test performed? No .  Length of time to ambulate 10 feet: N/A sec.     Cognitive Function:        Immunizations Immunization History  Administered Date(s) Administered   Fluad Quad(high Dose 65+) 10/24/2018   Influenza-Unspecified  10/06/2017, 10/17/2019, 10/12/2020   PFIZER(Purple Top)SARS-COV-2 Vaccination 01/16/2019, 02/06/2019    TDAP status: Due, Education has been provided regarding the importance of this vaccine. Advised may receive this vaccine at local pharmacy or Health Dept. Aware to provide a copy of the vaccination record if obtained from local pharmacy or Health Dept. Verbalized acceptance and understanding.  Flu Vaccine status: Up to date  Pneumococcal vaccine status: Declined,  Education has been provided regarding the importance of this vaccine but patient still declined. Advised may receive this vaccine at local pharmacy or Health Dept. Aware to provide a copy of the vaccination record if obtained from local pharmacy or Health Dept. Verbalized acceptance and understanding.   Covid-19 vaccine status: Completed vaccines  Qualifies for Shingles Vaccine? Yes   Zostavax completed No   Shingrix Completed?: No.    Education has been provided regarding the importance of this vaccine. Patient has been advised to call insurance company to determine out of pocket expense if they have not yet received this vaccine. Advised may also receive vaccine at local pharmacy or Health Dept. Verbalized acceptance and understanding.  Screening Tests Health Maintenance  Topic Date Due   TETANUS/TDAP  Never done   Zoster Vaccines- Shingrix (1 of 2) Never done   Pneumonia Vaccine 67+ Years old (1 - PCV) Never done   COVID-19 Vaccine (3 - Pfizer risk series) 03/06/2019   INFLUENZA VACCINE  08/03/2021   HPV VACCINES  Aged Out    Health Maintenance  Health Maintenance Due  Topic Date Due   TETANUS/TDAP  Never done   Zoster Vaccines- Shingrix (1 of 2) Never done   Pneumonia Vaccine 70+ Years old (1 - PCV) Never done   COVID-19 Vaccine (3 - [employer]  risk series) 03/06/2019    Colorectal cancer screening: No longer required.   Lung Cancer Screening: (Low Dose CT Chest recommended if Age 52-80 years, 30 pack-year currently  smoking OR have quit w/in 15years.) does not qualify.   Lung Cancer Screening Referral: DEFERRED TO PCP   Additional Screening:  Hepatitis C Screening: does qualify; Completed DEFERRED TO PCP   Vision Screening: Recommended annual ophthalmology exams for early detection of glaucoma and other disorders of the eye. Is the patient up to date with their annual eye exam?  No  Who is the provider or what is the name of the office in which the patient attends annual eye exams? Dr Gilford Rile [place] [facility]  If pt is not established with a provider, would they like to be referred to a provider to establish care? No .   Dental Screening: Recommended annual dental exams for proper oral hygiene  Community Resource Referral / Chronic Care Management: CRR required this visit?  No   CCM required this visit?  No      Plan:     I have personally reviewed and noted the following in the patient's chart:   Medical and social history Use of alcohol, tobacco or illicit drugs  Current medications and supplements including opioid prescriptions. Patient is not currently taking opioid prescriptions. Functional ability and status Nutritional status Physical activity Advanced directives List of other physicians Hospitalizations, surgeries, and ER visits in previous 12 months Vitals Screenings to include cognitive, depression, and falls Referrals and appointments  In addition, I have reviewed and discussed with patient certain preventive protocols, quality metrics, and best practice recommendations. A written personalized care plan for preventive services as well as general preventive health recommendations were provided to patient.     Judyann Munson, CMA   06/28/2021   Nurse Notes: IN PERSON [place]    Mr. Hosang , Thank you for taking time to come for your Medicare Wellness Visit. I appreciate your ongoing commitment to your health goals. Please review the following plan we discussed and let me  know if I can assist you in the future.   These are the goals we discussed:  Goals   None     This is a list of the screening recommended for you and due dates:  Health Maintenance  Topic Date Due   Tetanus Vaccine  Never done   Zoster (Shingles) Vaccine (1 of 2) Never done   Pneumonia Vaccine (1 - PCV) Never done   COVID-19 Vaccine (3 - Pfizer risk series) 03/06/2019   Flu Shot  08/03/2021   HPV Vaccine  Aged Out

## 2021-06-29 LAB — CUP PACEART REMOTE DEVICE CHECK
Battery Remaining Longevity: 71 mo
Battery Remaining Percentage: 95.5 %
Battery Voltage: 2.99 V
Date Time Interrogation Session: 20230623040031
Implantable Lead Implant Date: 20030319
Implantable Lead Implant Date: 20030319
Implantable Lead Implant Date: 20110211
Implantable Lead Location: 753858
Implantable Lead Location: 753859
Implantable Lead Location: 753860
Implantable Lead Model: 158
Implantable Lead Model: 4087
Implantable Lead Serial Number: 113412
Implantable Lead Serial Number: 159867
Implantable Pulse Generator Implant Date: 20221223
Lead Channel Impedance Value: 460 Ohm
Lead Channel Impedance Value: 660 Ohm
Lead Channel Pacing Threshold Amplitude: 0.75 V
Lead Channel Pacing Threshold Amplitude: 1.375 V
Lead Channel Pacing Threshold Pulse Width: 0.5 ms
Lead Channel Pacing Threshold Pulse Width: 1 ms
Lead Channel Sensing Intrinsic Amplitude: 12 mV
Lead Channel Setting Pacing Amplitude: 2.5 V
Lead Channel Setting Pacing Amplitude: 2.5 V
Lead Channel Setting Pacing Pulse Width: 0.5 ms
Lead Channel Setting Pacing Pulse Width: 1 ms
Lead Channel Setting Sensing Sensitivity: 2 mV
Pulse Gen Model: 3222
Pulse Gen Serial Number: 3978258

## 2021-06-29 LAB — BMP8+ANION GAP
Anion Gap: 17 mmol/L (ref 10.0–18.0)
BUN/Creatinine Ratio: 17 (ref 10–24)
BUN: 16 mg/dL (ref 8–27)
CO2: 21 mmol/L (ref 20–29)
Calcium: 9.2 mg/dL (ref 8.6–10.2)
Chloride: 105 mmol/L (ref 96–106)
Creatinine, Ser: 0.94 mg/dL (ref 0.76–1.27)
Glucose: 111 mg/dL — ABNORMAL HIGH (ref 70–99)
Potassium: 3.9 mmol/L (ref 3.5–5.2)
Sodium: 143 mmol/L (ref 134–144)
eGFR: 78 mL/min/{1.73_m2} (ref >59)

## 2021-06-29 NOTE — Assessment & Plan Note (Signed)
Assessment/Plan BP at goal. BP in clinic today 118/64, HR 79. Home medications include Coreg 25 mg BID, Lasix 40 mg every other day, IMDUR 30 mg qd, losartan 25 mg qd, spironolactone 25 mg qd. Reports good medication compliance. Tolerating medication without adverse effects. Denies headaches, vision changes, lightheadedness, chest pain, SHOB, leg swelling or changes in speech. Exam benign and vitals otherwise wnl. -Last creatinine was 0.9 in 05/2021.  -Continue current medications -Continue lifestyle changes -BMP this visits shows normal renal function.

## 2021-06-30 NOTE — Progress Notes (Signed)
Remote pacemaker transmission.   

## 2021-07-01 NOTE — Progress Notes (Signed)
Internal Medicine Clinic Attending  Case discussed with Dr. Khan  At the time of the visit.  We reviewed the resident's history and exam and pertinent patient test results.  I agree with the assessment, diagnosis, and plan of care documented in the resident's note.  

## 2021-07-27 ENCOUNTER — Other Ambulatory Visit: Payer: Self-pay | Admitting: *Deleted

## 2021-07-27 NOTE — Patient Outreach (Signed)
[facility]) Care Management  07/27/2021  Mark Nguyen 1931-05-04 [id number]   RN Health Coach telephone call to patient.  [employer] compliance verified. His shortness of breath is better. He is having to take the fluid pill every other day. He is taking his medications as per ordered. Patient stated that he is hurting so bad with his back. He can hardly stand. He went to a chiropractor  but it hasn't got much better. RN discussed following up with his PCP. RN closed case. Refer to Care Coordinator for continued follow of care.   Plan: RN Case Closure Case Closure Refer to Care Coordinator to continue care   [place] Management [phone]

## 2021-07-28 ENCOUNTER — Encounter: Payer: Self-pay | Admitting: Student

## 2021-07-28 ENCOUNTER — Ambulatory Visit (INDEPENDENT_AMBULATORY_CARE_PROVIDER_SITE_OTHER): Payer: Medicare Other | Admitting: Student

## 2021-07-28 VITALS — BP 128/80 | HR 81 | Temp 98.4°F | Ht 67.0 in | Wt 173.6 lb

## 2021-07-28 DIAGNOSIS — M545 Low back pain, unspecified: Secondary | ICD-10-CM | POA: Diagnosis not present

## 2021-07-28 DIAGNOSIS — Z87891 Personal history of nicotine dependence: Secondary | ICD-10-CM

## 2021-07-28 DIAGNOSIS — I5022 Chronic systolic (congestive) heart failure: Secondary | ICD-10-CM

## 2021-07-28 DIAGNOSIS — I10 Essential (primary) hypertension: Secondary | ICD-10-CM | POA: Diagnosis not present

## 2021-07-28 MED ORDER — SPIRONOLACTONE 25 MG PO TABS: 12.5000 mg | ORAL_TABLET | Freq: Every day | ORAL | 3 refills | Status: DC

## 2021-07-28 NOTE — Patient Instructions (Addendum)
Thank you so much for coming into the clinic today! It was a pleasure meeting you Mr. Alric!   Here is a quick summary of everything we talked about!   We are sending you to Physical Therapy somewhere in [place], they will give you a phone call soon!  2. In the meantime, please keep taking the Tylenol as you need it!  3. I also refilled your spironolactone!   If you have any questions, please contact the clinic at [phone]  Best, Dr. Marlene Lard Khyrie Masi

## 2021-07-28 NOTE — Progress Notes (Signed)
CC: Back Pain  HPI:  [name] is a 88 y.o. male living with a history stated below and presents today for back pain. Please see problem based assessment and plan for additional details.  Past Medical History:  Diagnosis Date   CAD (coronary artery disease)    CHF (congestive heart failure) (HCC)    Dyslipidemia    History of ventricular fibrillation    HTN (hypertension)    Hx-sudden cardiac arrest    Ischemic cardiomyopathy    LBBB (left bundle branch block)    Ventricular tachycardia ([place])     Current Outpatient Medications on File Prior to Visit  Medication Sig Dispense Refill   apixaban (ELIQUIS) 5 MG TABS tablet TAKE 1 TABLET BY MOUTH 2 TIMES DAILY. 60 tablet 5   atorvastatin (LIPITOR) 20 MG tablet Take 1 tablet (20 mg total) by mouth at bedtime. 90 tablet 1   carvedilol (COREG) 25 MG tablet TAKE 1 TABLET BY MOUTH TWICE DAILY WITH A MEAL 180 tablet 3   Cholecalciferol (VITAMIN D-3) 1000 UNITS CAPS Take 1,000 Units by mouth daily.      furosemide (LASIX) 40 MG tablet TAKE 1 TABLET BY MOUTH DAILY. HOLD DOSAGE IF WEIGHT < 183 POUNDS 90 tablet 3   isosorbide mononitrate (IMDUR) 30 MG 24 hr tablet Take 1 tablet (30 mg total) by mouth daily. 90 tablet 3   losartan (COZAAR) 25 MG tablet TAKE 1/2 TABLET AT BEDTIME 45 tablet 3   omeprazole (PRILOSEC) 20 MG capsule TAKE 1 CAPSULE DAILY 90 capsule 3   phenylephrine (SUDAFED PE) 10 MG TABS tablet Take 10 mg by mouth every 6 (six) hours as needed (congestion).     Tamsulosin HCl (FLOMAX) 0.4 MG CAPS Take 0.4 mg by mouth daily after breakfast.      No current facility-administered medications on file prior to visit.    Family History  Problem Relation Age of Onset   Coronary artery disease Other     Social History   Socioeconomic History   Marital status: Divorced    Spouse name: Not on file   Number of children: Not on file   Years of education: Not on file   Highest education level: Not on file  Occupational  History   Occupation: furniture truck driver  Tobacco Use   Smoking status: Former    Types: Cigarettes    Quit date: 01/03/1985    Years since quitting: 36.5   Smokeless tobacco: Never  Vaping Use   Vaping Use: Never used  Substance and Sexual Activity   Alcohol use: Never   Drug use: Never   Sexual activity: Not on file  Other Topics Concern   Not on file  Social History Narrative   Not on file   Social Determinants of Health   Financial Resource Strain: Low Risk  (06/28/2021)   Overall Financial Resource Strain (CARDIA)    Difficulty of Paying Living Expenses: Not hard at all  Food Insecurity: No Food Insecurity (06/28/2021)   Hunger Vital Sign    Worried About Running Out of Food in the Last Year: Never true    [place] in the Last Year: Never true  Transportation Needs: No Transportation Needs (06/28/2021)   PRAPARE - Hydrologist (Medical): No    Lack of Transportation (Non-Medical): No  Physical Activity: Insufficiently Active (06/28/2021)   Exercise Vital Sign    Days of Exercise per Week: 4 days    Minutes  of Exercise per Session: 30 min  Stress: No Stress Concern Present (06/28/2021)   Drain    Feeling of Stress : Not at all  Social Connections: Socially Isolated (06/28/2021)   Social Connection and Isolation Panel [NHANES]    Frequency of Communication with Friends and Family: Never    Frequency of Social Gatherings with Friends and Family: More than three times a week    Attends Religious Services: Never    Marine scientist or Organizations: No    Attends Archivist Meetings: Never    Marital Status: Divorced  Human resources officer Violence: Not At Risk (06/28/2021)   Humiliation, Afraid, Rape, and Kick questionnaire    Fear of Current or Ex-Partner: No    Emotionally Abused: No    Physically Abused: No    Sexually Abused: No    Review of  Systems: ROS negative except for what is noted on the assessment and plan.  Vitals:   07/28/21 1410  BP: 128/80  Pulse: 81  Temp: 98.4 F (36.9 C)  TempSrc: Oral  SpO2: 99%  Weight: 173 lb 9.6 oz (78.7 kg)  Height: '5\' 7"'$  (1.702 m)    Physical Exam: Constitutional: well-appearing elderly male,  sitting in wheelchair, in no acute distress HENT: normocephalic atraumatic, mucous membranes moist Eyes: conjunctiva non-erythematous Neck: supple Cardiovascular: regular rate and rhythm, no m/r/g Pulmonary/Chest: normal work of breathing on room air, lungs clear to auscultation bilaterally Abdominal: soft, non-tender, non-distended MSK: normal bulk and tone, lower back along the belt line is tender Neurological: alert & oriented x 3, 5/5 strength in bilateral upper and lower extremities, normal gait Skin: warm and dry  Assessment & Plan:   Lumbar back pain Patient is still complaining of his lumbar pain. He was getting onto his tractor to mow the lawn and hurt his back. The pain is in the lumbar region and radiates to the left and right side along the belt line. He visited a chiropractor and said provided him with minimal relief. He is able to do most of his ADL's and IADLs, however says the pain is restricting him doing his daily activities. He uses tylenol to get some relief. He denies any fever or urinary symptoms.  Plan:  - At previous visits, patient was hesitant to try physical therapy, but now it seems he is much more open to it. I put in a referral for PT in [place], where he lives.   Essential hypertension Refilled patient's spironolactone today.  Patient seen with Dr. Ronny Bacon Desaree Downen, M.D. [facility], PGY-1 Phone: [phone] Date 07/28/2021 Time 8:57 PM

## 2021-07-28 NOTE — Assessment & Plan Note (Addendum)
Patient is still complaining of his lumbar pain. He was getting onto his tractor to mow the lawn and hurt his back. The pain is in the lumbar region and radiates to the left and right side along the belt line. He visited a chiropractor and said provided him with minimal relief. He is able to do most of his ADL's and IADLs, however says the pain is restricting him doing his daily activities. He uses tylenol to get some relief. He denies any fever or urinary symptoms.  Plan:  - At previous visits, patient was hesitant to try physical therapy, but now it seems he is much more open to it. I put in a referral for PT in [place], where he lives.

## 2021-07-28 NOTE — Assessment & Plan Note (Signed)
Refilled patient's spironolactone today.

## 2021-08-02 NOTE — Progress Notes (Signed)
Internal Medicine Clinic Attending  Case discussed with Dr. Nooruddin  at the time of the visit.  We reviewed the resident's history and exam and pertinent patient test results.  I agree with the assessment, diagnosis, and plan of care documented in the resident's note.  

## 2021-08-18 NOTE — Therapy (Addendum)
OUTPATIENT PHYSICAL THERAPY THORACOLUMBAR EVALUATION/DC SUMMARY   Patient Name: Mark Nguyen MRN: [id number] DOB:1931/03/23, 88 y.o., male Today's Date: 08/19/2021 PHYSICAL THERAPY DISCHARGE SUMMARY  Visits from Start of Care: 1  Current functional level related to goals / functional outcomes: Eval only   Remaining deficits: UTA   Education / Equipment: HEP   Patient agrees to discharge. Patient goals were partially met. Patient is being discharged due to  Transferred to clinic closer to home.   PT End of Session - 08/19/21 1718     Visit Number 1    Number of Visits 8    Date for PT Re-Evaluation 09/16/21    Authorization Type MCR    PT Start Time 1315    PT Stop Time 1400    PT Time Calculation (min) 45 min    Activity Tolerance Patient tolerated treatment well    Behavior During Therapy WFL for tasks assessed/performed             Past Medical History:  Diagnosis Date   CAD (coronary artery disease)    CHF (congestive heart failure) (HCC)    Dyslipidemia    History of ventricular fibrillation    HTN (hypertension)    Hx-sudden cardiac arrest    Ischemic cardiomyopathy    LBBB (left bundle branch block)    Ventricular tachycardia ([place])    Past Surgical History:  Procedure Laterality Date   AV NODE ABLATION N/A 12/25/2020   Procedure: AV NODE ABLATION;  Surgeon: Evans Lance, MD;  Location: [place] [facility];  Service: Cardiovascular;  Laterality: N/A;   BIV PACEMAKER INSERTION CRT-P N/A 12/25/2020   Procedure: DOWNGRADE BIV PACEMAKER INSERTION CRT-P;  Surgeon: Evans Lance, MD;  Location: [place] [facility];  Service: Cardiovascular;  Laterality: N/A;   CARDIAC CATHETERIZATION  10/12/2008   CARDIAC DEFIBRILLATOR PLACEMENT     [employer]   CARDIOVERSION N/A 06/28/2019   Procedure: CARDIOVERSION;  Surgeon: Lelon Perla, MD;  Location: [facility] ENDOSCOPY;  Service: Cardiovascular;  Laterality: N/A;   CARDIOVERSION N/A 02/14/2020   Procedure:  CARDIOVERSION;  Surgeon: Sanda Klein, MD;  Location: [place] ENDOSCOPY;  Service: Cardiovascular;  Laterality: N/A;   DOPPLER ECHOCARDIOGRAPHY  2008, 2011   EP IMPLANTABLE DEVICE N/A 05/19/2014   Procedure: ICD/BIV ICD Generator Changeout;  Surgeon: Evans Lance, MD;  Location: [place] [facility];  Service: Cardiovascular;  Laterality: N/A;   TONSILECTOMY, ADENOIDECTOMY, BILATERAL MYRINGOTOMY AND TUBES     Patient Active Problem List   Diagnosis Date Noted   Lumbar back pain 06/01/2021   Biventricular cardiac pacemaker in situ 03/30/2021   Encounter for cardioversion procedure    At risk for obstructive sleep apnea 11/23/2018   Hyperglycemia 11/22/2018   Acute respiratory failure with hypoxia ([place]) 11/16/2018   Iron deficiency anemia 11/16/2018   Chronic combined systolic (congestive) and diastolic (congestive) heart failure ([place]) 11/15/2018   Atrial fibrillation ([place]) 01/18/2011   CARDIOVASCULAR FUNCTION STUDY, ABNORMAL 54/65/6812   SYSTOLIC HEART FAILURE, CHRONIC 06/09/2008   DYSLIPIDEMIA 03/25/2008   Essential hypertension 03/25/2008   Coronary atherosclerosis 03/25/2008   ISCHEMIC CARDIOMYOPATHY 03/25/2008   LBBB 03/25/2008   VENTRICULAR TACHYCARDIA 03/25/2008   VENTRICULAR FIBRILLATION 03/25/2008   PERSONAL HISTORY OF SUDDEN CARDIAC ARREST 03/25/2008   Automatic implantable cardioverter-defibrillator in situ 03/25/2008    PCP: Lacinda Axon, MD  REFERRING PROVIDER: Aldine Contes, MD  REFERRING DIAG: M54.50 (ICD-10-CM) - Lumbar back pain   Rationale for Evaluation and Treatment Rehabilitation  THERAPY DIAG:  Lumbar back pain    ONSET DATE: chronic  SUBJECTIVE:                                                                                                                                                                                           SUBJECTIVE STATEMENT: Describes low back pain, worse with position changes, denies radiating symptoms, notes  difficulty getting on/off tractor.  Symptoms ongoing since 05/03/21.  Patient has a pacemaker. PERTINENT HISTORY:  Lumbar back pain Patient is still complaining of his lumbar pain. He was getting onto his tractor to mow the lawn and hurt his back. The pain is in the lumbar region and radiates to the left and right side along the belt line. He visited a chiropractor and said provided him with minimal relief. He is able to do most of his ADL's and IADLs, however says the pain is restricting him doing his daily activities. He uses tylenol to get some relief. He denies any fever or urinary symptoms.   Plan:  - At previous visits, patient was hesitant to try physical therapy, but now it seems he is much more open to it. I put in a referral for PT in [place], where he lives.   PAIN:  Are you having pain? Yes: NPRS scale: 8/10 Pain location: low back Pain description: ache Aggravating factors: riding tractor, getting on/off tractor Relieving factors: rest   PRECAUTIONS: None  WEIGHT BEARING RESTRICTIONS No  FALLS:  Has patient fallen in last 6 months? No  LIVING ENVIRONMENT: Lives with: lives with their family Lives in: House/apartment Stairs: No Has following equipment at home: None  OCCUPATION: retired  PLOF: Independent  PATIENT GOALS To reduce and manage my back pain   OBJECTIVE:   DIAGNOSTIC FINDINGS:  none  PATIENT SURVEYS:  FOTO deferred  SCREENING FOR RED FLAGS: Bowel or bladder incontinence: No  COGNITION:  Overall cognitive status: Within functional limits for tasks assessed     SENSATION: WFL  MUSCLE LENGTH: Hamstrings: Right 60 deg; Left 60 deg Thomas test: unremarkable  POSTURE: rounded shoulders, forward head, decreased lumbar lordosis, and increased thoracic kyphosis  PALPATION: Unremarkable for pain  LUMBAR ROM:   Active  A/PROM  eval  Flexion 50%  Extension 0%  Right lateral flexion 25%  Left lateral flexion 25%  Right rotation 33%  Left  rotation 33%   (Blank rows = not tested)  LOWER EXTREMITY ROM:   BLE ROM WFL and noncontributory to pain  Active  Right eval Left eval  Hip flexion    Hip extension    Hip abduction    Hip adduction  Hip internal rotation    Hip external rotation    Knee flexion    Knee extension    Ankle dorsiflexion    Ankle plantarflexion    Ankle inversion    Ankle eversion     (Blank rows = not tested)  LOWER EXTREMITY MMT:    MMT Right eval Left eval  Hip flexion 4 4  Hip extension 4 4  Hip abduction 4 4  Hip adduction    Hip internal rotation    Hip external rotation    Knee flexion    Knee extension 4 4  Ankle dorsiflexion    Ankle plantarflexion 4 4  Ankle inversion    Ankle eversion     (Blank rows = not tested)  LUMBAR SPECIAL TESTS:  Straight leg raise test: Negative, Slump test: Negative, FABER test: Negative, and Thomas test: Negative  FUNCTIONAL TESTS:  5 times sit to stand: 13s with BUE suppotr  GAIT: Distance walked: 75fx2 Assistive device utilized: Single point cane Level of assistance: Complete Independence Comments: antalgic gait, decreased trunk rotation    TODAY'S TREATMENT  Eval and HEP   PATIENT EDUCATION:  Education details: Discussed eval findings, rehab rationale and POC and patient is in agreement  Person educated: Patient Education method: Explanation and Handouts Education comprehension: verbalized understanding and needs further education   HOME EXERCISE PROGRAM: Access Code: [email or web address] URL: [email or web address] Date: 08/19/2021 Prepared by: JSharlynn Oliphant Exercises - Sit to Stand with Armchair  - 2 x daily - 7 x weekly - 1 sets - 5 reps  ASSESSMENT:  CLINICAL IMPRESSION: Patient is a 88y.o. male who was seen today for physical therapy evaluation and treatment for low back pain. Patient denies radicular symptoms.  LE ROM is functional with soft tissue restrictions noted in hamstrings and hip flexors.   Postural changes of increased kyphosis and decreased lordosis present.  Mild strength deficits in LE's due to back pain.  Marked restrictions to lumbar mobility    OBJECTIVE IMPAIRMENTS Abnormal gait, decreased activity tolerance, decreased knowledge of condition, decreased mobility, difficulty walking, decreased ROM, decreased strength, increased muscle spasms, impaired flexibility, improper body mechanics, and postural dysfunction.   ACTIVITY LIMITATIONS carrying, lifting, bending, sitting, standing, squatting, and bed mobility  PARTICIPATION LIMITATIONS: driving and yard work  PERSONAL FACTORS Age, Fitness, and Time since onset of injury/illness/exacerbation are also affecting patient's functional outcome.   REHAB POTENTIAL: Fair based on age and arthritic condition  CLINICAL DECISION MAKING: Stable/uncomplicated  EVALUATION COMPLEXITY: Low   GOALS: Goals reviewed with patient? No  SHORT TERM GOALS: Target date: 09/02/2021  Patient to demonstrate independence in HEP  Baseline:[email or web address] Goal status: INITIAL  2.  Patient to stand 1 time w/o UE support Baseline: Requires BUE Support Goal status: INITIAL    LONG TERM GOALS: Target date: 09/16/2021  Increase BLE strength to 4+/5 globally Baseline: 4/5 globally Goal status: INITIAL  2.  Increase AROM lumbar spine to 50% throughout, 25% extension Baseline:  Active  A/PROM  eval  Flexion 50%  Extension 0%  Right lateral flexion 25%  Left lateral flexion 25%  Right rotation 33%  Left rotation 33%   Goal status: INITIAL  3.  Decrease worst pain to 4/10 Baseline: 8/10 worst pain  Goal status: INITIAL  4.  Patient to return to getting on/off tractor with minimal difficulty/discomfort Baseline: Unable to access tractor w/o pain Goal status: INITIAL    PLAN: PT FREQUENCY: 2x/week  PT DURATION: 4 weeks  PLANNED INTERVENTIONS: Therapeutic exercises,  Therapeutic activity, Neuromuscular re-education, Balance training,  Gait training, Patient/Family education, Self Care, Joint mobilization, Stair training, Spinal mobilization, Moist heat, Manual therapy, and Re-evaluation.  PLAN FOR NEXT SESSION: HEP review update, establish stretching program, core strengthning, lumbar mobility tasks   Lanice Shirts, PT 08/19/2021, 5:19 PM

## 2021-08-19 ENCOUNTER — Ambulatory Visit: Payer: Medicare Other | Attending: Internal Medicine

## 2021-08-19 DIAGNOSIS — M6281 Muscle weakness (generalized): Secondary | ICD-10-CM | POA: Diagnosis not present

## 2021-08-19 DIAGNOSIS — M5459 Other low back pain: Secondary | ICD-10-CM | POA: Insufficient documentation

## 2021-08-19 DIAGNOSIS — M47816 Spondylosis without myelopathy or radiculopathy, lumbar region: Secondary | ICD-10-CM | POA: Insufficient documentation

## 2021-08-19 DIAGNOSIS — M545 Low back pain, unspecified: Secondary | ICD-10-CM | POA: Diagnosis not present

## 2021-08-23 NOTE — Progress Notes (Signed)
Internal Medicine Clinic Attending  Case and documentation of Dr. Amponsah  soon after the resident saw the patient reviewed.  I reviewed the AWV findings.  I agree with the assessment, diagnosis, and plan of care documented in the AWV note.     

## 2021-08-26 ENCOUNTER — Encounter: Payer: Self-pay | Admitting: Student

## 2021-08-26 ENCOUNTER — Ambulatory Visit (INDEPENDENT_AMBULATORY_CARE_PROVIDER_SITE_OTHER): Payer: Medicare Other | Admitting: Student

## 2021-08-26 DIAGNOSIS — M545 Low back pain, unspecified: Secondary | ICD-10-CM | POA: Diagnosis not present

## 2021-08-26 NOTE — Progress Notes (Signed)
   CC: Back pain follow up  HPI:  [name] is a 88 y.o. male with PMH as below who presents to clinic to follow-up on his back pain. Please see problem based charting for evaluation, assessment and plan.  Past Medical History:  Diagnosis Date   CAD (coronary artery disease)    CHF (congestive heart failure) (HCC)    Dyslipidemia    History of ventricular fibrillation    HTN (hypertension)    Hx-sudden cardiac arrest    Ischemic cardiomyopathy    LBBB (left bundle branch block)    Ventricular tachycardia (HCC)     Review of Systems:  Constitutional: Negative for fever, chills or fatigue MSK: Positive for back pain and joint pains  Abdomen: Negative for abdominal pain, bowel or bladder incontinence Neuro: Negative for headache, dizziness, numbness or weakness  Physical Exam: General: Pleasant, well-appreciate elderly man in wheelchair. No acute distress. Cardiac: RRR. No murmurs, rubs or gallops. No LE edema Respiratory: Lungs CTAB. No wheezing or crackles. Abdominal: Soft, symmetric and non tender. Normal BS. Skin: Warm, dry and intact without rashes or lesions MSK: No tenderness to palpation of C-spine, T-spine or L-spine. No paraspinal tenderness. Mildly limited ROM of the spine.  Neuro: A&O x 3. Moves all extremities. Normal sensation to gross touch.  Psych: Appropriate mood and affect.  Vitals:   08/26/21 1010  BP: 126/75  Pulse: 80  Temp: 97.7 F (36.5 C)  TempSrc: Oral  SpO2: 99%  Weight: 176 lb 1.6 oz (79.9 kg)    Assessment & Plan:   Lumbar back pain Patient here for 1 month follow-up on his back pain. Patient states he is unsure what this appointment is for better recently started PT. States he was initially referred to PT in [place] but was called to come to PT in [place] last week. States the session went well and his pain is improving. PT referred him to [facility] to continue his PT sessions there since he lives near the hospital.  States he has been slowly getting back to mowing his lawn again. He denies any fevers, numbness, weakness, bowel or bladder incontinence or any further trauma to his back. He uses tylenol occasionally for the pain. He has no back pain on exam today. We will continue conservative management as this seems to working for him.   Plan: -Continue PT sessions -Continue OTC tylenol as needed for back pain    See Encounters Tab for problem based charting.  Patient discussed with Dr.  Dani Gobble, MD, MPH

## 2021-08-26 NOTE — Progress Notes (Signed)
Internal Medicine Clinic Attending  Case discussed with Dr. Amponsah  At the time of the visit.  We reviewed the resident's history and exam and pertinent patient test results.  I agree with the assessment, diagnosis, and plan of care documented in the resident's note.  

## 2021-08-26 NOTE — Assessment & Plan Note (Addendum)
Patient here for 1 month follow-up on his back pain. Patient states he is unsure what this appointment is for better recently started PT. States he was initially referred to PT in [place] but was called to come to PT in [place] last week. States the session went well and his pain is improving. PT referred him to [facility] to continue his PT sessions there since he lives near the hospital. States he has been slowly getting back to mowing his lawn again. He denies any fevers, numbness, weakness, bowel or bladder incontinence or any further trauma to his back. He uses tylenol occasionally for the pain. He has no back pain on exam today. We will continue conservative management as this seems to working for him.   Plan: -Continue PT sessions -Continue OTC tylenol as needed for back pain

## 2021-08-26 NOTE — Patient Instructions (Signed)
Thank you, [name] for allowing [name] to provide your care today. Today we discussed your back pain, blood pressure and your PT sessions. Continue your PT sessions and follow up with [name] as needed.      My Chart Access: [email or web address]  Please follow-up with me in January  Please make sure to arrive 15 minutes prior to your next appointment. If you arrive late, you may be asked to reschedule.    We look forward to seeing you next time. Please call our clinic at [phone] if you have any questions or concerns. The best time to call is Monday-Friday from 9am-4pm, but there is someone available 24/7. If after hours or the weekend, call the main hospital number and ask for the Internal Medicine Resident On-Call. If you need medication refills, please notify your pharmacy one week in advance and they will send [name] a request.   Thank you for letting us take part in your care. Wishing you the best!  Lacinda Axon, MD 08/26/2021, 10:44 AM IM Resident, PGY-3 [place]ian 41:10

## 2021-08-27 ENCOUNTER — Encounter: Payer: Medicare Other | Admitting: Student

## 2021-09-07 ENCOUNTER — Ambulatory Visit: Payer: Self-pay

## 2021-09-08 NOTE — Patient Instructions (Signed)
Visit Information  Thank you for taking time to visit with me today. Please don't hesitate to contact me if I can be of assistance to you.   Following are the goals we discussed today:   Goals Addressed             This Visit's Progress    I want to manage my Afib        Care Coordination Interventions: Active listening / Reflection utilized  Emotional Support Provided Problem Mark Nguyen strategies reviewed I spoke with Mark Nguyen, who informed me that his back pain had improved. He has been using Tylenol to alleviate the pain. I suggested he try a rub and a heating pad for around 30 minutes to ease the pain. The patient mentioned that he experienced some flutters, but he assured that his office monitors him. We went over the Afib protocol information together.         Our next appointment is by telephone on 10/5 at 9 am  Please call the care guide team at [phone] if you need to cancel or reschedule your appointment.   The patient verbalized understanding of instructions, educational materials, and care plan provided today.    Mark Arms RN, BSN, [facility]   Phone: [phone]

## 2021-09-08 NOTE — Patient Outreach (Signed)
  Care Coordination   Initial Visit Note   09/08/2021 Name: Mark Nguyen MRN: [id number] DOB: 1931/10/30  Mark Nguyen is a 88 y.o. year old male who sees Amponsah, Charisse March, MD for primary care. I spoke with  Mark Nguyen by phone today.  What matters to the patients health and wellness today?  My back feels better and I have felt some flutters from time to time.   Goals Addressed             This Visit's Progress    I want to manage my Afib        Care Coordination Interventions: Active listening / Reflection utilized  Emotional Support Provided Problem [place] strategies reviewed I spoke with Mark Nguyen, who informed me that his back pain had improved. He has been using Tylenol to alleviate the pain. I suggested he try a rub and a heating pad for around 30 minutes to ease the pain. The patient mentioned that he experienced some flutters, but he assured that his office monitors him. We went over the Afib protocol information together.         SDOH assessments and interventions completed:  No     Care Coordination Interventions Activated:  Yes  Care Coordination Interventions:  Yes, provided   Follow up plan: Follow up call scheduled for 10/5 9 am    Encounter Outcome:  Pt. Visit Completed   Lazaro Arms RN, BSN, [facility]   Phone: [phone]

## 2021-09-16 DIAGNOSIS — M545 Low back pain, unspecified: Secondary | ICD-10-CM | POA: Diagnosis not present

## 2021-09-16 DIAGNOSIS — R293 Abnormal posture: Secondary | ICD-10-CM | POA: Diagnosis not present

## 2021-09-16 DIAGNOSIS — M2560 Stiffness of unspecified joint, not elsewhere classified: Secondary | ICD-10-CM | POA: Diagnosis not present

## 2021-09-16 DIAGNOSIS — M6281 Muscle weakness (generalized): Secondary | ICD-10-CM | POA: Diagnosis not present

## 2021-09-16 DIAGNOSIS — R2689 Other abnormalities of gait and mobility: Secondary | ICD-10-CM | POA: Diagnosis not present

## 2021-09-20 ENCOUNTER — Other Ambulatory Visit: Payer: Self-pay | Admitting: Internal Medicine

## 2021-09-20 DIAGNOSIS — M2560 Stiffness of unspecified joint, not elsewhere classified: Secondary | ICD-10-CM | POA: Diagnosis not present

## 2021-09-20 DIAGNOSIS — R293 Abnormal posture: Secondary | ICD-10-CM | POA: Diagnosis not present

## 2021-09-20 DIAGNOSIS — M545 Low back pain, unspecified: Secondary | ICD-10-CM | POA: Diagnosis not present

## 2021-09-20 DIAGNOSIS — M6281 Muscle weakness (generalized): Secondary | ICD-10-CM | POA: Diagnosis not present

## 2021-09-20 DIAGNOSIS — R2689 Other abnormalities of gait and mobility: Secondary | ICD-10-CM | POA: Diagnosis not present

## 2021-09-24 ENCOUNTER — Ambulatory Visit (INDEPENDENT_AMBULATORY_CARE_PROVIDER_SITE_OTHER): Payer: Medicare Other

## 2021-09-24 DIAGNOSIS — R2689 Other abnormalities of gait and mobility: Secondary | ICD-10-CM | POA: Diagnosis not present

## 2021-09-24 DIAGNOSIS — M6281 Muscle weakness (generalized): Secondary | ICD-10-CM | POA: Diagnosis not present

## 2021-09-24 DIAGNOSIS — M545 Low back pain, unspecified: Secondary | ICD-10-CM | POA: Diagnosis not present

## 2021-09-24 DIAGNOSIS — I428 Other cardiomyopathies: Secondary | ICD-10-CM | POA: Diagnosis not present

## 2021-09-24 DIAGNOSIS — R293 Abnormal posture: Secondary | ICD-10-CM | POA: Diagnosis not present

## 2021-09-24 DIAGNOSIS — M2560 Stiffness of unspecified joint, not elsewhere classified: Secondary | ICD-10-CM | POA: Diagnosis not present

## 2021-09-27 DIAGNOSIS — R293 Abnormal posture: Secondary | ICD-10-CM | POA: Diagnosis not present

## 2021-09-27 DIAGNOSIS — M2560 Stiffness of unspecified joint, not elsewhere classified: Secondary | ICD-10-CM | POA: Diagnosis not present

## 2021-09-27 DIAGNOSIS — M6281 Muscle weakness (generalized): Secondary | ICD-10-CM | POA: Diagnosis not present

## 2021-09-27 DIAGNOSIS — R2689 Other abnormalities of gait and mobility: Secondary | ICD-10-CM | POA: Diagnosis not present

## 2021-09-27 DIAGNOSIS — M545 Low back pain, unspecified: Secondary | ICD-10-CM | POA: Diagnosis not present

## 2021-09-27 LAB — CUP PACEART REMOTE DEVICE CHECK
Battery Remaining Longevity: 67 mo
Battery Remaining Percentage: 93 %
Battery Voltage: 2.99 V
Date Time Interrogation Session: 20230922040016
Implantable Lead Implant Date: 20030319
Implantable Lead Implant Date: 20030319
Implantable Lead Implant Date: 20110211
Implantable Lead Location: 753858
Implantable Lead Location: 753859
Implantable Lead Location: 753860
Implantable Lead Model: 158
Implantable Lead Model: 4087
Implantable Lead Serial Number: 113412
Implantable Lead Serial Number: 159867
Implantable Pulse Generator Implant Date: 20221223
Lead Channel Impedance Value: 410 Ohm
Lead Channel Impedance Value: 630 Ohm
Lead Channel Pacing Threshold Amplitude: 0.75 V
Lead Channel Pacing Threshold Amplitude: 1.625 V
Lead Channel Pacing Threshold Pulse Width: 0.5 ms
Lead Channel Pacing Threshold Pulse Width: 1 ms
Lead Channel Sensing Intrinsic Amplitude: 9.4 mV
Lead Channel Setting Pacing Amplitude: 2.5 V
Lead Channel Setting Pacing Amplitude: 2.5 V
Lead Channel Setting Pacing Pulse Width: 0.5 ms
Lead Channel Setting Pacing Pulse Width: 1 ms
Lead Channel Setting Sensing Sensitivity: 2 mV
Pulse Gen Model: 3222
Pulse Gen Serial Number: 3978258

## 2021-09-30 DIAGNOSIS — R293 Abnormal posture: Secondary | ICD-10-CM | POA: Diagnosis not present

## 2021-09-30 DIAGNOSIS — M2560 Stiffness of unspecified joint, not elsewhere classified: Secondary | ICD-10-CM | POA: Diagnosis not present

## 2021-09-30 DIAGNOSIS — R2689 Other abnormalities of gait and mobility: Secondary | ICD-10-CM | POA: Diagnosis not present

## 2021-09-30 DIAGNOSIS — M6281 Muscle weakness (generalized): Secondary | ICD-10-CM | POA: Diagnosis not present

## 2021-09-30 DIAGNOSIS — M545 Low back pain, unspecified: Secondary | ICD-10-CM | POA: Diagnosis not present

## 2021-10-01 NOTE — Progress Notes (Signed)
Remote pacemaker transmission.   

## 2021-10-04 DIAGNOSIS — C61 Malignant neoplasm of prostate: Secondary | ICD-10-CM | POA: Diagnosis not present

## 2021-10-04 DIAGNOSIS — R351 Nocturia: Secondary | ICD-10-CM | POA: Diagnosis not present

## 2021-10-06 DIAGNOSIS — Z23 Encounter for immunization: Secondary | ICD-10-CM | POA: Diagnosis not present

## 2021-10-07 ENCOUNTER — Ambulatory Visit: Payer: Self-pay

## 2021-10-07 NOTE — Patient Instructions (Signed)
Visit Information  Thank you for taking time to visit with me today. Please don't hesitate to contact me if I can be of assistance to you.   Following are the goals we discussed today:   Goals Addressed             This Visit's Progress    I want to manage my Afib        Care Coordination Interventions: Active listening / Reflection utilized  Emotional Support Provided Problem [place] strategies reviewed During my conversation with Mark Nguyen, he reported that he is doing well and has not experienced any issues with his Afib. He had a remote check on September 22, and he mentioned that he usually receives a paper in the mail with the results. He is still taking his medications as prescribed. Today, he has a physical therapy scheduled, which will be his final appointment, but he said that the visits have been helpful. In total, he has attended seven sessions for his back.         Our next appointment is by telephone on 11/14 at 915 am  Please call the care guide team at [phone] if you need to cancel or reschedule your appointment.   If you are experiencing a Mental Health or [place] or need someone to talk to, please call 1-800-273-TALK (toll free, 24 hour hotline)  The patient verbalized understanding of instructions, educational materials, and care plan provided today.    Lazaro Arms RN, BSN, [place] [facility]   Phone: [phone]

## 2021-10-07 NOTE — Patient Outreach (Signed)
  Care Coordination   Follow Up Visit Note   10/07/2021 Name: Mark Nguyen MRN: [id number] DOB: 08-01-1931  Mark Nguyen is a 88 y.o. year old male who sees Amponsah, Charisse March, MD for primary care. I spoke with  Mark Nguyen by phone today.  What matters to the patients health and wellness today?  I have not had any problems with my Afib.    Goals Addressed             This Visit's Progress    I want to manage my Afib        Care Coordination Interventions: Active listening / Reflection utilized  Emotional Support Provided Problem [place] strategies reviewed During my conversation with Mark Nguyen, he reported that he is doing well and has not experienced any issues with his Afib. He had a remote check on September 22, and he mentioned that he usually receives a paper in the mail with the results. He is still taking his medications as prescribed. Today, he has a physical therapy scheduled, which will be his final appointment, but he said that the visits have been helpful. In total, he has attended seven sessions for his back.         SDOH assessments and interventions completed:  No     Care Coordination Interventions Activated:  Yes  Care Coordination Interventions:  Yes, provided   Follow up plan: Follow up call scheduled for 11/14 915 am    Encounter Outcome:  Pt. Visit Completed    Lazaro Arms RN, BSN, [place] [facility]   Phone: [phone]

## 2021-10-10 DIAGNOSIS — R9431 Abnormal electrocardiogram [ECG] [EKG]: Secondary | ICD-10-CM | POA: Diagnosis not present

## 2021-10-10 DIAGNOSIS — S72042A Displaced fracture of base of neck of left femur, initial encounter for closed fracture: Secondary | ICD-10-CM | POA: Diagnosis not present

## 2021-10-10 DIAGNOSIS — S72032A Displaced midcervical fracture of left femur, initial encounter for closed fracture: Secondary | ICD-10-CM | POA: Diagnosis not present

## 2021-10-10 DIAGNOSIS — M25552 Pain in left hip: Secondary | ICD-10-CM | POA: Diagnosis not present

## 2021-10-10 DIAGNOSIS — S2231XA Fracture of one rib, right side, initial encounter for closed fracture: Secondary | ICD-10-CM | POA: Diagnosis not present

## 2021-10-10 DIAGNOSIS — D696 Thrombocytopenia, unspecified: Secondary | ICD-10-CM | POA: Diagnosis not present

## 2021-10-10 DIAGNOSIS — I2510 Atherosclerotic heart disease of native coronary artery without angina pectoris: Secondary | ICD-10-CM | POA: Diagnosis not present

## 2021-10-10 DIAGNOSIS — D62 Acute posthemorrhagic anemia: Secondary | ICD-10-CM | POA: Diagnosis not present

## 2021-10-10 DIAGNOSIS — W19XXXA Unspecified fall, initial encounter: Secondary | ICD-10-CM | POA: Diagnosis not present

## 2021-10-10 DIAGNOSIS — S72002A Fracture of unspecified part of neck of left femur, initial encounter for closed fracture: Secondary | ICD-10-CM | POA: Diagnosis not present

## 2021-10-11 DIAGNOSIS — D696 Thrombocytopenia, unspecified: Secondary | ICD-10-CM | POA: Diagnosis not present

## 2021-10-11 DIAGNOSIS — S72032A Displaced midcervical fracture of left femur, initial encounter for closed fracture: Secondary | ICD-10-CM | POA: Diagnosis not present

## 2021-10-11 DIAGNOSIS — S72002A Fracture of unspecified part of neck of left femur, initial encounter for closed fracture: Secondary | ICD-10-CM | POA: Diagnosis not present

## 2021-10-11 DIAGNOSIS — W19XXXA Unspecified fall, initial encounter: Secondary | ICD-10-CM | POA: Diagnosis not present

## 2021-10-11 DIAGNOSIS — D62 Acute posthemorrhagic anemia: Secondary | ICD-10-CM | POA: Diagnosis not present

## 2021-10-11 DIAGNOSIS — S72052A Unspecified fracture of head of left femur, initial encounter for closed fracture: Secondary | ICD-10-CM | POA: Diagnosis not present

## 2021-10-11 DIAGNOSIS — I10 Essential (primary) hypertension: Secondary | ICD-10-CM | POA: Diagnosis not present

## 2021-10-11 DIAGNOSIS — Z96642 Presence of left artificial hip joint: Secondary | ICD-10-CM | POA: Diagnosis not present

## 2021-10-12 DIAGNOSIS — D62 Acute posthemorrhagic anemia: Secondary | ICD-10-CM | POA: Diagnosis not present

## 2021-10-12 DIAGNOSIS — D696 Thrombocytopenia, unspecified: Secondary | ICD-10-CM | POA: Diagnosis not present

## 2021-10-12 DIAGNOSIS — S72032A Displaced midcervical fracture of left femur, initial encounter for closed fracture: Secondary | ICD-10-CM | POA: Diagnosis not present

## 2021-10-13 DIAGNOSIS — D696 Thrombocytopenia, unspecified: Secondary | ICD-10-CM | POA: Diagnosis not present

## 2021-10-13 DIAGNOSIS — S72032A Displaced midcervical fracture of left femur, initial encounter for closed fracture: Secondary | ICD-10-CM | POA: Diagnosis not present

## 2021-10-13 DIAGNOSIS — D62 Acute posthemorrhagic anemia: Secondary | ICD-10-CM | POA: Diagnosis not present

## 2021-10-14 DIAGNOSIS — D696 Thrombocytopenia, unspecified: Secondary | ICD-10-CM | POA: Diagnosis not present

## 2021-10-14 DIAGNOSIS — D62 Acute posthemorrhagic anemia: Secondary | ICD-10-CM | POA: Diagnosis not present

## 2021-10-14 DIAGNOSIS — S72032A Displaced midcervical fracture of left femur, initial encounter for closed fracture: Secondary | ICD-10-CM | POA: Diagnosis not present

## 2021-10-15 DIAGNOSIS — G4700 Insomnia, unspecified: Secondary | ICD-10-CM | POA: Diagnosis not present

## 2021-10-15 DIAGNOSIS — R279 Unspecified lack of coordination: Secondary | ICD-10-CM | POA: Diagnosis not present

## 2021-10-15 DIAGNOSIS — R070 Pain in throat: Secondary | ICD-10-CM | POA: Diagnosis not present

## 2021-10-15 DIAGNOSIS — Z950 Presence of cardiac pacemaker: Secondary | ICD-10-CM | POA: Diagnosis not present

## 2021-10-15 DIAGNOSIS — S72032A Displaced midcervical fracture of left femur, initial encounter for closed fracture: Secondary | ICD-10-CM | POA: Diagnosis not present

## 2021-10-15 DIAGNOSIS — R059 Cough, unspecified: Secondary | ICD-10-CM | POA: Diagnosis not present

## 2021-10-15 DIAGNOSIS — I2510 Atherosclerotic heart disease of native coronary artery without angina pectoris: Secondary | ICD-10-CM | POA: Diagnosis not present

## 2021-10-15 DIAGNOSIS — S72002D Fracture of unspecified part of neck of left femur, subsequent encounter for closed fracture with routine healing: Secondary | ICD-10-CM | POA: Diagnosis not present

## 2021-10-15 DIAGNOSIS — M150 Primary generalized (osteo)arthritis: Secondary | ICD-10-CM | POA: Diagnosis not present

## 2021-10-15 DIAGNOSIS — I480 Paroxysmal atrial fibrillation: Secondary | ICD-10-CM | POA: Diagnosis not present

## 2021-10-15 DIAGNOSIS — D62 Acute posthemorrhagic anemia: Secondary | ICD-10-CM | POA: Diagnosis not present

## 2021-10-15 DIAGNOSIS — Z7401 Bed confinement status: Secondary | ICD-10-CM | POA: Diagnosis not present

## 2021-10-15 DIAGNOSIS — R609 Edema, unspecified: Secondary | ICD-10-CM | POA: Diagnosis not present

## 2021-10-15 DIAGNOSIS — M84459D Pathological fracture, hip, unspecified, subsequent encounter for fracture with routine healing: Secondary | ICD-10-CM | POA: Diagnosis not present

## 2021-10-15 DIAGNOSIS — U071 COVID-19: Secondary | ICD-10-CM | POA: Diagnosis not present

## 2021-10-15 DIAGNOSIS — R601 Generalized edema: Secondary | ICD-10-CM | POA: Diagnosis not present

## 2021-10-15 DIAGNOSIS — M8000XS Age-related osteoporosis with current pathological fracture, unspecified site, sequela: Secondary | ICD-10-CM | POA: Diagnosis not present

## 2021-10-15 DIAGNOSIS — D649 Anemia, unspecified: Secondary | ICD-10-CM | POA: Diagnosis not present

## 2021-10-15 DIAGNOSIS — K2100 Gastro-esophageal reflux disease with esophagitis, without bleeding: Secondary | ICD-10-CM | POA: Diagnosis not present

## 2021-10-15 DIAGNOSIS — D696 Thrombocytopenia, unspecified: Secondary | ICD-10-CM | POA: Diagnosis not present

## 2021-10-15 DIAGNOSIS — I10 Essential (primary) hypertension: Secondary | ICD-10-CM | POA: Diagnosis not present

## 2021-10-19 DIAGNOSIS — D649 Anemia, unspecified: Secondary | ICD-10-CM | POA: Diagnosis not present

## 2021-10-19 DIAGNOSIS — M84459D Pathological fracture, hip, unspecified, subsequent encounter for fracture with routine healing: Secondary | ICD-10-CM | POA: Diagnosis not present

## 2021-10-19 DIAGNOSIS — M8000XS Age-related osteoporosis with current pathological fracture, unspecified site, sequela: Secondary | ICD-10-CM | POA: Diagnosis not present

## 2021-10-19 DIAGNOSIS — I480 Paroxysmal atrial fibrillation: Secondary | ICD-10-CM | POA: Diagnosis not present

## 2021-10-21 DIAGNOSIS — I480 Paroxysmal atrial fibrillation: Secondary | ICD-10-CM | POA: Diagnosis not present

## 2021-10-21 DIAGNOSIS — M84459D Pathological fracture, hip, unspecified, subsequent encounter for fracture with routine healing: Secondary | ICD-10-CM | POA: Diagnosis not present

## 2021-10-21 DIAGNOSIS — D649 Anemia, unspecified: Secondary | ICD-10-CM | POA: Diagnosis not present

## 2021-10-21 DIAGNOSIS — I2510 Atherosclerotic heart disease of native coronary artery without angina pectoris: Secondary | ICD-10-CM | POA: Diagnosis not present

## 2021-10-27 DIAGNOSIS — M84459D Pathological fracture, hip, unspecified, subsequent encounter for fracture with routine healing: Secondary | ICD-10-CM | POA: Diagnosis not present

## 2021-10-27 DIAGNOSIS — R609 Edema, unspecified: Secondary | ICD-10-CM | POA: Diagnosis not present

## 2021-10-28 DIAGNOSIS — R601 Generalized edema: Secondary | ICD-10-CM | POA: Diagnosis not present

## 2021-10-29 DIAGNOSIS — M84459D Pathological fracture, hip, unspecified, subsequent encounter for fracture with routine healing: Secondary | ICD-10-CM | POA: Diagnosis not present

## 2021-10-29 DIAGNOSIS — R609 Edema, unspecified: Secondary | ICD-10-CM | POA: Diagnosis not present

## 2021-10-30 DIAGNOSIS — R059 Cough, unspecified: Secondary | ICD-10-CM | POA: Diagnosis not present

## 2021-10-30 DIAGNOSIS — R070 Pain in throat: Secondary | ICD-10-CM | POA: Diagnosis not present

## 2021-11-03 DIAGNOSIS — R059 Cough, unspecified: Secondary | ICD-10-CM | POA: Diagnosis not present

## 2021-11-03 DIAGNOSIS — M84459D Pathological fracture, hip, unspecified, subsequent encounter for fracture with routine healing: Secondary | ICD-10-CM | POA: Diagnosis not present

## 2021-11-04 DIAGNOSIS — D649 Anemia, unspecified: Secondary | ICD-10-CM | POA: Diagnosis not present

## 2021-11-04 DIAGNOSIS — M84459D Pathological fracture, hip, unspecified, subsequent encounter for fracture with routine healing: Secondary | ICD-10-CM | POA: Diagnosis not present

## 2021-11-04 DIAGNOSIS — U071 COVID-19: Secondary | ICD-10-CM | POA: Diagnosis not present

## 2021-11-04 DIAGNOSIS — R609 Edema, unspecified: Secondary | ICD-10-CM | POA: Diagnosis not present

## 2021-11-15 ENCOUNTER — Telehealth: Payer: Self-pay | Admitting: *Deleted

## 2021-11-15 DIAGNOSIS — S72002D Fracture of unspecified part of neck of left femur, subsequent encounter for closed fracture with routine healing: Secondary | ICD-10-CM | POA: Diagnosis not present

## 2021-11-15 DIAGNOSIS — K2100 Gastro-esophageal reflux disease with esophagitis, without bleeding: Secondary | ICD-10-CM | POA: Diagnosis not present

## 2021-11-15 DIAGNOSIS — D696 Thrombocytopenia, unspecified: Secondary | ICD-10-CM | POA: Diagnosis not present

## 2021-11-15 DIAGNOSIS — Z950 Presence of cardiac pacemaker: Secondary | ICD-10-CM | POA: Diagnosis not present

## 2021-11-15 DIAGNOSIS — G4700 Insomnia, unspecified: Secondary | ICD-10-CM | POA: Diagnosis not present

## 2021-11-15 DIAGNOSIS — I10 Essential (primary) hypertension: Secondary | ICD-10-CM | POA: Diagnosis not present

## 2021-11-15 DIAGNOSIS — I2510 Atherosclerotic heart disease of native coronary artery without angina pectoris: Secondary | ICD-10-CM | POA: Diagnosis not present

## 2021-11-15 DIAGNOSIS — K5903 Drug induced constipation: Secondary | ICD-10-CM | POA: Diagnosis not present

## 2021-11-15 DIAGNOSIS — M150 Primary generalized (osteo)arthritis: Secondary | ICD-10-CM | POA: Diagnosis not present

## 2021-11-15 DIAGNOSIS — I480 Paroxysmal atrial fibrillation: Secondary | ICD-10-CM | POA: Diagnosis not present

## 2021-11-15 DIAGNOSIS — D649 Anemia, unspecified: Secondary | ICD-10-CM | POA: Diagnosis not present

## 2021-11-15 DIAGNOSIS — N400 Enlarged prostate without lower urinary tract symptoms: Secondary | ICD-10-CM | POA: Diagnosis not present

## 2021-11-15 DIAGNOSIS — I252 Old myocardial infarction: Secondary | ICD-10-CM | POA: Diagnosis not present

## 2021-11-15 NOTE — Telephone Encounter (Addendum)
Received call from [place] with [facility]. Requesting VO for HH PT and OT Ev[place]. States patient was recently d/c to home from [employer] in [place] initially contracted with different [facility] but they didn't have staffing. States she can use the F2F from [facility] just needs new order. Verbal Josem Kaufmann given to [place], [place]. Will route to [facility] for agreement/denial.

## 2021-11-15 NOTE — Telephone Encounter (Signed)
I agree. Thank you.

## 2021-11-16 ENCOUNTER — Ambulatory Visit: Payer: Self-pay

## 2021-11-16 NOTE — Patient Instructions (Signed)
Visit Information  Thank you for taking time to visit with me today. Please don't hesitate to contact me if I can be of assistance to you.   Following are the goals we discussed today:   Goals Addressed             This Visit's Progress    I want to manage my Afib        Care Coordination Interventions: Active listening / Reflection utilized  Emotional Support Provided Problem [employer] strategies reviewed During our conversation, the patient informed me that he had been admitted to the hospital due to a fall leading to a hip fracture. Currently, he is back home and being taken care of by his loved ones while receiving Physical and Occupational therapy. He has been relying on his walker for mobility as he feels unsafe without it. The patient denied any issues related to blood pressure or a-fib, which is being closely monitored. We reviewed the fall precautions and discussed the A-fib action plan, and the patient demonstrated understanding.        Our next appointment is by telephone on 12/14 at 9 am  Please call the care guide team at [phone] if you need to cancel or reschedule your appointment.   If you are experiencing a Mental Health or [place] or need someone to talk to, please call 1-800-273-TALK (toll free, 24 hour hotline)  The patient verbalized understanding of instructions, educational materials, and care plan provided today and agreed to receive a mailed copy of patient instructions, educational materials, and care plan.    Lazaro Arms RN, BSN, [place] [facility]   Phone: [phone]

## 2021-11-16 NOTE — Patient Outreach (Signed)
  Care Coordination   Initial Visit Note   11/16/2021 Name: Mark Nguyen MRN: [id number] DOB: 10-07-1931  Mark Nguyen is a 88 y.o. year old male who sees Amponsah, Charisse March, MD for primary care. I spoke with  Mark Nguyen by phone today.  What matters to the patients health and wellness today?  I fell and broke my hip    Goals Addressed             This Visit's Progress    I want to manage my Afib        Care Coordination Interventions: Active listening / Reflection utilized  Emotional Support Provided Problem [place] strategies reviewed During our conversation, the patient informed me that he had been admitted to the hospital due to a fall leading to a hip fracture. Currently, he is back home and being taken care of by his loved ones while receiving Physical and Occupational therapy. He has been relying on his walker for mobility as he feels unsafe without it. The patient denied any issues related to Nguyen pressure or a-fib, which is being closely monitored. We reviewed the fall precautions and discussed the A-fib action plan, and the patient demonstrated understanding.        SDOH assessments and interventions completed:  No     Care Coordination Interventions Activated:  Yes  Care Coordination Interventions:  Yes, provided   Follow up plan: Follow up call scheduled for 12/14  9 am    Encounter Outcome:  Pt. Visit Completed   Lazaro Arms RN, BSN, [place] [facility]   Phone: [phone]

## 2021-11-17 DIAGNOSIS — I480 Paroxysmal atrial fibrillation: Secondary | ICD-10-CM | POA: Diagnosis not present

## 2021-11-17 DIAGNOSIS — I252 Old myocardial infarction: Secondary | ICD-10-CM | POA: Diagnosis not present

## 2021-11-17 DIAGNOSIS — I2510 Atherosclerotic heart disease of native coronary artery without angina pectoris: Secondary | ICD-10-CM | POA: Diagnosis not present

## 2021-11-17 DIAGNOSIS — I10 Essential (primary) hypertension: Secondary | ICD-10-CM | POA: Diagnosis not present

## 2021-11-17 DIAGNOSIS — S72002D Fracture of unspecified part of neck of left femur, subsequent encounter for closed fracture with routine healing: Secondary | ICD-10-CM | POA: Diagnosis not present

## 2021-11-17 DIAGNOSIS — D649 Anemia, unspecified: Secondary | ICD-10-CM | POA: Diagnosis not present

## 2021-11-22 DIAGNOSIS — I480 Paroxysmal atrial fibrillation: Secondary | ICD-10-CM | POA: Diagnosis not present

## 2021-11-22 DIAGNOSIS — I2510 Atherosclerotic heart disease of native coronary artery without angina pectoris: Secondary | ICD-10-CM | POA: Diagnosis not present

## 2021-11-22 DIAGNOSIS — D649 Anemia, unspecified: Secondary | ICD-10-CM | POA: Diagnosis not present

## 2021-11-22 DIAGNOSIS — S72002D Fracture of unspecified part of neck of left femur, subsequent encounter for closed fracture with routine healing: Secondary | ICD-10-CM | POA: Diagnosis not present

## 2021-11-22 DIAGNOSIS — I10 Essential (primary) hypertension: Secondary | ICD-10-CM | POA: Diagnosis not present

## 2021-11-22 DIAGNOSIS — I252 Old myocardial infarction: Secondary | ICD-10-CM | POA: Diagnosis not present

## 2021-11-23 DIAGNOSIS — S72002A Fracture of unspecified part of neck of left femur, initial encounter for closed fracture: Secondary | ICD-10-CM | POA: Diagnosis not present

## 2021-11-23 DIAGNOSIS — S72009A Fracture of unspecified part of neck of unspecified femur, initial encounter for closed fracture: Secondary | ICD-10-CM | POA: Diagnosis not present

## 2021-11-29 ENCOUNTER — Other Ambulatory Visit: Payer: Self-pay | Admitting: Internal Medicine

## 2021-11-29 DIAGNOSIS — I2510 Atherosclerotic heart disease of native coronary artery without angina pectoris: Secondary | ICD-10-CM | POA: Diagnosis not present

## 2021-11-29 DIAGNOSIS — S72002D Fracture of unspecified part of neck of left femur, subsequent encounter for closed fracture with routine healing: Secondary | ICD-10-CM | POA: Diagnosis not present

## 2021-11-29 DIAGNOSIS — I10 Essential (primary) hypertension: Secondary | ICD-10-CM | POA: Diagnosis not present

## 2021-11-29 DIAGNOSIS — I252 Old myocardial infarction: Secondary | ICD-10-CM | POA: Diagnosis not present

## 2021-11-29 DIAGNOSIS — D649 Anemia, unspecified: Secondary | ICD-10-CM | POA: Diagnosis not present

## 2021-11-29 DIAGNOSIS — I480 Paroxysmal atrial fibrillation: Secondary | ICD-10-CM | POA: Diagnosis not present

## 2021-12-13 DIAGNOSIS — S72002D Fracture of unspecified part of neck of left femur, subsequent encounter for closed fracture with routine healing: Secondary | ICD-10-CM | POA: Diagnosis not present

## 2021-12-13 DIAGNOSIS — I480 Paroxysmal atrial fibrillation: Secondary | ICD-10-CM | POA: Diagnosis not present

## 2021-12-13 DIAGNOSIS — I2510 Atherosclerotic heart disease of native coronary artery without angina pectoris: Secondary | ICD-10-CM | POA: Diagnosis not present

## 2021-12-13 DIAGNOSIS — D649 Anemia, unspecified: Secondary | ICD-10-CM | POA: Diagnosis not present

## 2021-12-13 DIAGNOSIS — I10 Essential (primary) hypertension: Secondary | ICD-10-CM | POA: Diagnosis not present

## 2021-12-13 DIAGNOSIS — I252 Old myocardial infarction: Secondary | ICD-10-CM | POA: Diagnosis not present

## 2021-12-16 ENCOUNTER — Ambulatory Visit: Payer: Self-pay

## 2021-12-16 NOTE — Patient Outreach (Signed)
  Care Coordination   Follow Up Visit Note   12/16/2021 Name: Mark Mark Nguyen MRN: [id number] DOB: 07/11/1931  Mark Mark Nguyen is a 88 y.o. year old male who sees Amponsah, Charisse March, MD for primary care. I spoke with  Mark Mark Nguyen by phone today.  What matters to the patients health and wellness today?  I had a conversation with Mark Mark Nguyen, and he informed me that he had completed his physical therapy this week. He is currently walking with his cane, and his balance has improved significantly. He also mentioned that he is now driving and feels more energetic. Even though he's doing well, his family still comes over to check on him regularly. He confirmed that he hasn't experienced any symptoms of Afib, and he is still being monitored by cardiology and is aware of the protocol.    Goals Addressed             This Visit's Progress    I want to manage my Afib        Care Coordination Interventions: Active listening / Reflection utilized  Emotional Support Provided Problem [place] strategies reviewed  Counseled on increased risk of stroke due to Afib and benefits of anticoagulation for stroke prevention Reviewed importance of adherence to anticoagulant exactly as prescribed Counseled on importance of regular laboratory monitoring as prescribed Counseled on seeking medical attention after a head injury or if there is Mark Nguyen in the urine/stool Afib action plan reviewed         SDOH assessments and interventions completed:  No     Care Coordination Interventions:  Yes, provided   Follow up plan: Follow up call scheduled for 01/19/22 9 am    Encounter Outcome:  Pt. Visit Completed    Lazaro Arms RN, BSN, [facility]   Phone: [phone]

## 2021-12-16 NOTE — Patient Instructions (Signed)
Visit Information  Thank you for taking time to visit with me today. Please don't hesitate to contact me if I can be of assistance to you.   Following are the goals we discussed today:   Goals Addressed             This Visit's Progress    I want to manage my Afib        Care Coordination Interventions: Active listening / Reflection utilized  Emotional Support Provided Problem [place] strategies reviewed  Counseled on increased risk of stroke due to Afib and benefits of anticoagulation for stroke prevention Reviewed importance of adherence to anticoagulant exactly as prescribed Counseled on importance of regular laboratory monitoring as prescribed Counseled on seeking medical attention after a head injury or if there is blood in the urine/stool Afib action plan reviewed         Our next appointment is by telephone on 01/19/22 at 9 am  Please call the care guide team at [phone] if you need to cancel or reschedule your appointment.   If you are experiencing a Mental Health or [place] or need someone to talk to, please call 1-800-273-TALK (toll free, 24 hour hotline)  The patient verbalized understanding of instructions, educational materials, and care plan provided today.    Lazaro Arms RN, BSN, [facility]   Phone: [phone]

## 2021-12-24 ENCOUNTER — Ambulatory Visit (INDEPENDENT_AMBULATORY_CARE_PROVIDER_SITE_OTHER): Payer: Medicare Other

## 2021-12-24 DIAGNOSIS — I428 Other cardiomyopathies: Secondary | ICD-10-CM

## 2021-12-24 LAB — CUP PACEART REMOTE DEVICE CHECK
Battery Remaining Longevity: 64 mo
Battery Remaining Percentage: 89 %
Battery Voltage: 2.99 V
Date Time Interrogation Session: 20231222040030
Implantable Lead Connection Status: 753985
Implantable Lead Connection Status: 753985
Implantable Lead Connection Status: 753985
Implantable Lead Implant Date: 20030319
Implantable Lead Implant Date: 20030319
Implantable Lead Implant Date: 20110211
Implantable Lead Location: 753858
Implantable Lead Location: 753859
Implantable Lead Location: 753860
Implantable Lead Model: 158
Implantable Lead Model: 4087
Implantable Lead Serial Number: 113412
Implantable Lead Serial Number: 159867
Implantable Pulse Generator Implant Date: 20221223
Lead Channel Impedance Value: 410 Ohm
Lead Channel Impedance Value: 630 Ohm
Lead Channel Pacing Threshold Amplitude: 0.75 V
Lead Channel Pacing Threshold Amplitude: 1.5 V
Lead Channel Pacing Threshold Pulse Width: 0.5 ms
Lead Channel Pacing Threshold Pulse Width: 1 ms
Lead Channel Sensing Intrinsic Amplitude: 12 mV
Lead Channel Setting Pacing Amplitude: 2.5 V
Lead Channel Setting Pacing Amplitude: 2.5 V
Lead Channel Setting Pacing Pulse Width: 0.5 ms
Lead Channel Setting Pacing Pulse Width: 1 ms
Lead Channel Setting Sensing Sensitivity: 2 mV
Pulse Gen Model: 3222
Pulse Gen Serial Number: 3978258

## 2022-01-13 NOTE — Progress Notes (Signed)
Remote pacemaker transmission.   

## 2022-01-19 ENCOUNTER — Ambulatory Visit: Payer: Self-pay

## 2022-01-20 NOTE — Patient Instructions (Signed)
Visit Information  Thank you for taking time to visit with me today. Please don't hesitate to contact me if I can be of assistance to you.   Following are the goals we discussed today:   Goals Addressed             This Visit's Progress    I want to manage my Afib        Care Coordination Interventions: Active listening / Reflection utilized  Emotional Support Provided Problem [place] strategies reviewed  Counseled on increased risk of stroke due to Afib and benefits of anticoagulation for stroke prevention Reviewed importance of adherence to anticoagulant exactly as prescribed Counseled on importance of regular laboratory monitoring as prescribed Counseled on seeking medical attention after a head injury or if there is blood in the urine/stool Afib action plan reviewed Mr. Muellner is doing well. He is regaining his balance and able to walk around the house using his walker at night and his cane during the day. He mentioned feeling occasional flutters but nothing concerning. However, he has experienced a decreased appetite lately and struggles to sleep through the night due to the need to use the bathroom frequently from his water pill. We discussed ways to limit his fluids at night and reviewed precautions to prevent falls and heart failure. He confirmed his understanding of the instructions.          Our next appointment is by telephone on 02/22/22 at 9 am  Please call the care guide team at [phone] if you need to cancel or reschedule your appointment.   If you are experiencing a Mental Health or [place] or need someone to talk to, please call 1-800-273-TALK (toll free, 24 hour hotline)  The patient verbalized understanding of instructions, educational materials, and care plan provided today.   Lazaro Arms RN, BSN, [facility]   Phone: [phone]

## 2022-01-20 NOTE — Patient Outreach (Signed)
  Care Coordination   Follow Up Visit Note   01/20/2022 Late Entry Name: Mark Nguyen MRN: [id number] DOB: 28-Apr-1931  Mark Nguyen is a 89 y.o. year old male who sees Amponsah, Charisse March, MD for primary care. I spoke with  Mark Nguyen by phone today.  What matters to the patients health and wellness today?  I am doing better    Goals Addressed             This Visit's Progress    I want to manage my Afib        Care Coordination Interventions: Active listening / Reflection utilized  Emotional Support Provided Problem [place] strategies reviewed  Counseled on increased risk of stroke due to Afib and benefits of anticoagulation for stroke prevention Reviewed importance of adherence to anticoagulant exactly as prescribed Counseled on importance of regular laboratory monitoring as prescribed Counseled on seeking medical attention after a head injury or if there is Nguyen in the urine/stool Afib action plan reviewed Mark Nguyen is doing well. He is regaining his balance and able to walk around the house using his walker at night and his cane during the day. He mentioned feeling occasional flutters but nothing concerning. However, he has experienced a decreased appetite lately and struggles to sleep through the night due to the need to use the bathroom frequently from his water pill. We discussed ways to limit his fluids at night and reviewed precautions to prevent falls and heart failure. He confirmed his understanding of the instructions.          SDOH assessments and interventions completed:  No     Care Coordination Interventions:  Yes, provided   Follow up plan: Follow up call scheduled for 02/22/22 9 am    Encounter Outcome:  Pt. Visit Completed   Lazaro Arms RN, BSN, [facility]   Phone: [phone]

## 2022-02-10 ENCOUNTER — Encounter (HOSPITAL_COMMUNITY): Payer: Self-pay | Admitting: *Deleted

## 2022-02-15 DIAGNOSIS — H31012 Macula scars of posterior pole (postinflammatory) (post-traumatic), left eye: Secondary | ICD-10-CM | POA: Diagnosis not present

## 2022-02-15 DIAGNOSIS — H25813 Combined forms of age-related cataract, bilateral: Secondary | ICD-10-CM | POA: Diagnosis not present

## 2022-02-22 ENCOUNTER — Telehealth: Payer: Self-pay | Admitting: *Deleted

## 2022-02-22 ENCOUNTER — Ambulatory Visit: Payer: Self-pay

## 2022-02-22 DIAGNOSIS — H02051 Trichiasis without entropian right upper eyelid: Secondary | ICD-10-CM | POA: Diagnosis not present

## 2022-02-22 DIAGNOSIS — H25812 Combined forms of age-related cataract, left eye: Secondary | ICD-10-CM | POA: Diagnosis not present

## 2022-02-22 DIAGNOSIS — H02054 Trichiasis without entropian left upper eyelid: Secondary | ICD-10-CM | POA: Diagnosis not present

## 2022-02-22 DIAGNOSIS — H25811 Combined forms of age-related cataract, right eye: Secondary | ICD-10-CM | POA: Diagnosis not present

## 2022-02-22 NOTE — Patient Instructions (Signed)
Visit Information  Thank you for taking time to visit with me today. Please don't hesitate to contact me if I can be of assistance to you.   Following are the goals we discussed today:   Goals Addressed             This Visit's Progress    I want to manage my Afib       Patient Goals/Self Care Activities: -Patient/Caregiver will self-administer medications as prescribed as evidenced by self-report/primary caregiver report  -Patient/Caregiver will attend all scheduled provider appointments as evidenced by clinician review of documented attendance to scheduled appointments and patient/caregiver report -Patient/Caregiver will call pharmacy for medication refills as evidenced by patient report and review of pharmacy fill history as appropriate -Patient/Caregiver will call provider office for new concerns or questions as evidenced by review of documented incoming telephone call notes and patient report -Patient/Caregiver verbalizes understanding of plan -Patient/Caregiver will focus on medication adherence by taking medications as prescribed  -Record your zone each day and know your atrial fibrillation (A-fib) Action Plan          Our next appointment is by telephone on 03/30/22 at 9 am  Please call the care guide team at [phone] if you need to cancel or reschedule your appointment.   If you are experiencing a Mental Health or [place] or need someone to talk to, please call 1-800-273-TALK (toll free, 24 hour hotline)  The patient verbalized understanding of instructions, educational materials, and care plan provided today and agreed to receive a mailed copy of patient instructions, educational materials, and care plan.   Lazaro Arms RN, BSN, [facility]   Phone: [phone]

## 2022-02-22 NOTE — Patient Outreach (Signed)
  Care Coordination   Follow Up Visit Note   02/22/2022 Name: Mark Nguyen MRN: [id number] DOB: [birth year]  Mark Nguyen is a 89 y.o. year old male who sees Amponsah, Charisse March, MD for primary care. I spoke with  Mark Nguyen by phone today.  What matters to the patients health and wellness today?  Mark Nguyen recently had an ophthalmology appointment to address cataracts in both eyes. The first cataract is scheduled to be removed on March 10, 2022, with the second one to follow on the 21st. Fortunately, he is recovering well. While he doesn't have any issues with atrial fibrillation (Afib), he still experiences difficulty with reading because of blurry vision.    Goals Addressed             This Visit's Progress    I want to manage my Afib       Patient Goals/Self Care Activities: -Patient/Caregiver will self-administer medications as prescribed as evidenced by self-report/primary caregiver report  -Patient/Caregiver will attend all scheduled provider appointments as evidenced by clinician review of documented attendance to scheduled appointments and patient/caregiver report -Patient/Caregiver will call pharmacy for medication refills as evidenced by patient report and review of pharmacy fill history as appropriate -Patient/Caregiver will call provider office for new concerns or questions as evidenced by review of documented incoming telephone call notes and patient report -Patient/Caregiver verbalizes understanding of plan -Patient/Caregiver will focus on medication adherence by taking medications as prescribed  -Record your zone each day and know your atrial fibrillation (A-fib) Action Plan          SDOH assessments and interventions completed:  No     Care Coordination Interventions:  Yes, provided   Interventions Today    Flowsheet Row Most Recent Value  Chronic Disease   Chronic disease during today's visit Atrial Fibrillation (AFib)  General Interventions    General Interventions Discussed/Reviewed General Interventions Discussed  Exercise Interventions   Exercise Discussed/Reviewed Physical Activity  Physical Activity Discussed/Reviewed Physical Activity Discussed  Nutrition Interventions   Nutrition Discussed/Reviewed Nutrition Discussed  Safety Interventions   Safety Discussed/Reviewed Safety Discussed        Follow up plan: Follow up call scheduled for 03/30/22 9 am    Encounter Outcome:  Pt. Visit Completed   Lazaro Arms RN, BSN, [facility]   Phone: [phone]

## 2022-02-22 NOTE — Telephone Encounter (Signed)
   Pre-operative Risk Assessment    Patient Name: Mark Nguyen  DOB: 02/26/1931 MRN: [id number]      Request for Surgical Clearance    Procedure:   CATARACT EXTRACTION BY PE, IOL-LEFT TO BE DONE ON 03/10/22 ; FOLLOWED BY THE RIGHT EYE ON 04/03/22  Date of Surgery:  Clearance 03/10/22                                 Surgeon:  DR. Julian Reil Surgeon's Group or Practice Name:  [facility] Phone number:  [phone] [phone] Fax number:  [phone]   Type of Clearance Requested:   - Medical ; NO MEDICATIONS ARE NEEDING TO BE HELD PER CLEARANCE FORM   Type of Anesthesia:  Not Indicated; LEFT MESSAGE TO CALL BACK WITH TYPE    Additional requests/questions:    Jiles Prows   02/22/2022, 1:20 PM

## 2022-02-22 NOTE — Telephone Encounter (Signed)
   Patient Name: MIQUEAS RACKERS  DOB: 1931/05/02 MRN: [id number]  Primary Cardiologist: None  Chart reviewed as part of pre-operative protocol coverage. Cataract extractions are recognized in guidelines as low risk surgeries that do not typically require specific preoperative testing or holding of blood thinner therapy. Therefore, given past medical history and time since last visit, based on ACC/AHA guidelines, DELOREAN DUFFY would be at acceptable risk for the planned procedure without further cardiovascular testing.   I will route this recommendation to the requesting party via Epic fax function and remove from pre-op pool.  Please call with questions.  Mayra Reel, NP 02/22/2022, 1:27 PM

## 2022-02-23 ENCOUNTER — Encounter: Payer: Self-pay | Admitting: Internal Medicine

## 2022-02-23 NOTE — Telephone Encounter (Signed)
I received a vm this morning with the type of anesthesia to be used.   ANESTHESIA: TOPICAL WITH IV SEDATION  Requesting office is also asking for device clearance and would like to know if pt does have a-fib. I will forward this note now to the device clinic as they may need to follow up with the requesting office if they have questions.   Our pre op team has provided cardiac clearance and has faxed over the clearance.   Our device team will need to follow up needing device clearance.

## 2022-02-23 NOTE — Progress Notes (Signed)
[facility]  Patient Information: Name:  Mark Nguyen  DOB:  [birth year]  MRN:  [id number]  Procedure:   CATARACT EXTRACTION BY PE, IOL-LEFT TO BE DONE ON 03/10/22 ; FOLLOWED BY THE RIGHT EYE ON 04/03/22   Date of Surgery:  Clearance 03/10/22                                 Surgeon:  DR. Julian Reil Surgeon's Group or Practice Name:  [facility]  Device Information:  Clinic EP Physician:  Cristopher Peru, MD   Device Type:  Pacemaker Manufacturer and Phone #:  [employer]. [employer]: [phone] Pacemaker Dependent?:  Yes.   Date of Last Device Check:  12/24/2021 Normal Device Function?:  Yes.    Electrophysiologist's Recommendations:  Have magnet available. Provide continuous ECG monitoring when magnet is used or reprogramming is to be performed.  Procedure should not interfere with device function.  No device programming or magnet placement needed.   Patient has permanent atrial fibrillation s/p AV node ablation.   Additional device questions call:  [phone]  Per Device Clinic Standing Orders, Damian Leavell, RN  9:32 AM 02/23/2022

## 2022-02-23 NOTE — Telephone Encounter (Signed)
See Preop documentation for device clearance.

## 2022-03-02 ENCOUNTER — Other Ambulatory Visit: Payer: Self-pay | Admitting: Internal Medicine

## 2022-03-10 DIAGNOSIS — H25812 Combined forms of age-related cataract, left eye: Secondary | ICD-10-CM | POA: Diagnosis not present

## 2022-03-10 DIAGNOSIS — H269 Unspecified cataract: Secondary | ICD-10-CM | POA: Diagnosis not present

## 2022-03-24 DIAGNOSIS — H25811 Combined forms of age-related cataract, right eye: Secondary | ICD-10-CM | POA: Diagnosis not present

## 2022-03-24 DIAGNOSIS — H269 Unspecified cataract: Secondary | ICD-10-CM | POA: Diagnosis not present

## 2022-03-25 ENCOUNTER — Ambulatory Visit (INDEPENDENT_AMBULATORY_CARE_PROVIDER_SITE_OTHER): Payer: Medicare Other

## 2022-03-25 DIAGNOSIS — I428 Other cardiomyopathies: Secondary | ICD-10-CM

## 2022-03-25 LAB — CUP PACEART REMOTE DEVICE CHECK
Battery Remaining Longevity: 61 mo
Battery Remaining Percentage: 86 %
Battery Voltage: 2.99 V
Date Time Interrogation Session: 20240322020019
Implantable Lead Connection Status: 753985
Implantable Lead Connection Status: 753985
Implantable Lead Connection Status: 753985
Implantable Lead Implant Date: 20030319
Implantable Lead Implant Date: 20030319
Implantable Lead Implant Date: 20110211
Implantable Lead Location: 753858
Implantable Lead Location: 753859
Implantable Lead Location: 753860
Implantable Lead Model: 158
Implantable Lead Model: 4087
Implantable Lead Serial Number: 113412
Implantable Lead Serial Number: 159867
Implantable Pulse Generator Implant Date: 20221223
Lead Channel Impedance Value: 390 Ohm
Lead Channel Impedance Value: 630 Ohm
Lead Channel Pacing Threshold Amplitude: 0.75 V
Lead Channel Pacing Threshold Amplitude: 1.75 V
Lead Channel Pacing Threshold Pulse Width: 0.5 ms
Lead Channel Pacing Threshold Pulse Width: 1 ms
Lead Channel Sensing Intrinsic Amplitude: 11.7 mV
Lead Channel Setting Pacing Amplitude: 2.5 V
Lead Channel Setting Pacing Amplitude: 2.5 V
Lead Channel Setting Pacing Pulse Width: 0.5 ms
Lead Channel Setting Pacing Pulse Width: 1 ms
Lead Channel Setting Sensing Sensitivity: 2 mV
Pulse Gen Model: 3222
Pulse Gen Serial Number: 3978258

## 2022-03-30 ENCOUNTER — Ambulatory Visit: Payer: Self-pay

## 2022-03-30 NOTE — Patient Outreach (Unsigned)
  Care Coordination   Follow Up Visit Note   03/30/2022 Name: Mark Nguyen MRN: [id number] DOB: 06-Nov-1931  Mark Nguyen is a 89 y.o. year old male who sees Amponsah, Charisse March, MD for primary care. I spoke with  Mark Nguyen by phone today.  What matters to the patients health and wellness today?  Mark Nguyen has reported that he is feeling good after undergoing successful cataract surgery for both his eyes. While he is now able to see clearly, he still needs to wear glasses when reading. He has a follow-up appointment scheduled in four weeks to monitor his progress. Mark Nguyen has not experienced any problems with his Afib symptoms. However, he does feel a little off-balance when he stands up. After a minute, he regains his balance. To help prevent any falls, Mark Nguyen plans to use his walker indoors when needed.     Goals Addressed             This Visit's Progress    I want to manage my Afib       Patient Goals/Self Care Activities: -Patient/Caregiver will self-administer medications as prescribed as evidenced by self-report/primary caregiver report  -Patient/Caregiver will attend all scheduled provider appointments as evidenced by clinician review of documented attendance to scheduled appointments and patient/caregiver report -Patient/Caregiver will call pharmacy for medication refills as evidenced by patient report and review of pharmacy fill history as appropriate -Patient/Caregiver will call provider office for new concerns or questions as evidenced by review of documented incoming telephone call notes and patient report -Patient/Caregiver verbalizes understanding of plan -Patient/Caregiver will focus on medication adherence by taking medications as prescribed  -Record your zone each day and know your atrial fibrillation (A-fib) Action Plan  - Continue to use you walker and cane when needed         SDOH assessments and interventions completed:  No     Care  Coordination Interventions:  Yes, provided   Interventions Today    Flowsheet Row Most Recent Value  Chronic Disease   Chronic disease during today's visit Atrial Fibrillation (AFib)  General Interventions   General Interventions Discussed/Reviewed General Interventions Discussed  Safety Interventions   Safety Discussed/Reviewed Safety Discussed, Safety Reviewed        Follow up plan: Follow up call scheduled for 04/29/22 9 am     [place], BSN, [facility]   Phone: [phone]     Encounter Outcome:  Pt. Visit Completed

## 2022-03-31 NOTE — Patient Instructions (Signed)
Visit Information  Thank you for taking time to visit with me today. Please don't hesitate to contact me if I can be of assistance to you.   Following are the goals we discussed today:   Goals Addressed             This Visit's Progress    I want to manage my Afib       Patient Goals/Self Care Activities: -Patient/Caregiver will self-administer medications as prescribed as evidenced by self-report/primary caregiver report  -Patient/Caregiver will attend all scheduled provider appointments as evidenced by clinician review of documented attendance to scheduled appointments and patient/caregiver report -Patient/Caregiver will call pharmacy for medication refills as evidenced by patient report and review of pharmacy fill history as appropriate -Patient/Caregiver will call provider office for new concerns or questions as evidenced by review of documented incoming telephone call notes and patient report -Patient/Caregiver verbalizes understanding of plan -Patient/Caregiver will focus on medication adherence by taking medications as prescribed  -Record your zone each day and know your atrial fibrillation (A-fib) Action Plan  - Continue to use you walker and cane when needed         Our next appointment is by telephone on 04/29/22  at 9 am  Please call the care guide team at [phone] if you need to cancel or reschedule your appointment.   If you are experiencing a Mental Health or [place] or need someone to talk to, please call 1-800-273-TALK (toll free, 24 hour hotline)  The patient verbalized understanding of instructions, educational materials, and care plan provided today.    Lazaro Arms RN, BSN, [facility]   Phone: [phone]

## 2022-04-05 DIAGNOSIS — R351 Nocturia: Secondary | ICD-10-CM | POA: Diagnosis not present

## 2022-04-05 DIAGNOSIS — C61 Malignant neoplasm of prostate: Secondary | ICD-10-CM | POA: Diagnosis not present

## 2022-04-14 ENCOUNTER — Other Ambulatory Visit: Payer: Self-pay | Admitting: Internal Medicine

## 2022-04-14 DIAGNOSIS — I4819 Other persistent atrial fibrillation: Secondary | ICD-10-CM

## 2022-04-14 NOTE — Telephone Encounter (Signed)
Eliquis 5mg  refill request received. Patient is 89 years old, weight-79.9kg, Crea-0.94 on 06/28/21, Diagnosis-Afib, and last seen by Dr. Ladona Ridgel on 03/30/21-PT NEEDS APPT. Dose is appropriate based on dosing criteria.   Pt needs an appointment with Cardiologist. Will send a message to Scheduler.

## 2022-04-15 NOTE — Telephone Encounter (Signed)
Eliquis 5mg  refill request received. Patient is 89 years old, weight-79.9kg, Crea-0.94 on 06/28/21, Diagnosis-Afib, and last seen by Dr. Ladona Ridgel on 03/30/21-PT NEEDS APPT AND NOW HAS AN APPT ON 06/17/22. Dose is appropriate based on dosing criteria

## 2022-04-15 NOTE — Telephone Encounter (Signed)
Spoke with pt and advised that he needs an updated appt with his cardiologist. Transferred him to the Schedulers.

## 2022-04-26 NOTE — Progress Notes (Signed)
Remote pacemaker transmission.   

## 2022-04-29 ENCOUNTER — Ambulatory Visit: Payer: Self-pay

## 2022-04-29 NOTE — Patient Outreach (Unsigned)
  Care Coordination   Follow Up Visit Note   04/30/2022 Name: Mark Nguyen MRN: [id number] DOB: 02/27/1931  Mark Nguyen is a 89 y.o. year old male who sees Amponsah, Flossie Buffy, MD for primary care. I spoke with  Mark Nguyen by phone today.  What matters to the patients health and wellness today?  Mark Nguyen is feeling tired because he mowed his lawn yesterday. He is currently using a walker to move around and feels exhausted. Although he needs to take short breaks every once in a while, he is scheduled to see a cardiologist on June 14th, 2024. He recently got his glasses back and can see better now. His appetite is good, but he is having trouble sleeping due to the fluid pill. We review hfall precautions and him to continue his walker.    Goals Addressed             This Visit's Progress    I want to manage my Afib       Patient Goals/Self Care Activities: -Patient/Caregiver will self-administer medications as prescribed as evidenced by self-report/primary caregiver report  -Patient/Caregiver will attend all scheduled provider appointments as evidenced by clinician review of documented attendance to scheduled appointments and patient/caregiver report -Patient/Caregiver will call pharmacy for medication refills as evidenced by patient report and review of pharmacy fill history as appropriate -Patient/Caregiver will call provider office for new concerns or questions as evidenced by review of documented incoming telephone call notes and patient report -Patient/Caregiver verbalizes understanding of plan -Patient/Caregiver will focus on medication adherence by taking medications as prescribed  -review your zone each day and know your atrial fibrillation (A-fib) Action Plan  - Continue to use you walker and cane when needed         SDOH assessments and interventions completed:  Nov     Care Coordination Interventions:  Yes, provided   Interventions Today    Flowsheet Row  Most Recent Value  Chronic Disease   Chronic disease during today's visit Atrial Fibrillation (AFib)  General Interventions   General Interventions Discussed/Reviewed General Interventions Reviewed  Nutrition Interventions   Nutrition Discussed/Reviewed Nutrition Discussed  Safety Interventions   Safety Discussed/Reviewed Safety Discussed, Fall Risk        Follow up plan: Follow up call scheduled for 05/31/22  9 am    Encounter Outcome:  Pt. Visit Completed   Juanell Fairly RN, BSN, [facility] Care Coordinator [facility]   Phone: [phone]

## 2022-04-30 NOTE — Patient Instructions (Signed)
Visit Information  Thank you for taking time to visit with me today. Please don't hesitate to contact me if I can be of assistance to you.   Following are the goals we discussed today:   Goals Addressed             This Visit's Progress    I want to manage my Afib       Patient Goals/Self Care Activities: -Patient/Caregiver will self-administer medications as prescribed as evidenced by self-report/primary caregiver report  -Patient/Caregiver will attend all scheduled provider appointments as evidenced by clinician review of documented attendance to scheduled appointments and patient/caregiver report -Patient/Caregiver will call pharmacy for medication refills as evidenced by patient report and review of pharmacy fill history as appropriate -Patient/Caregiver will call provider office for new concerns or questions as evidenced by review of documented incoming telephone call notes and patient report -Patient/Caregiver verbalizes understanding of plan -Patient/Caregiver will focus on medication adherence by taking medications as prescribed  -review your zone each day and know your atrial fibrillation (A-fib) Action Plan  - Continue to use you walker and cane when needed         Our next appointment is by telephone on 05/31/22 at 9 am  Please call the care guide team at [phone] if you need to cancel or reschedule your appointment.   If you are experiencing a Mental Health or Behavioral Health Crisis or need someone to talk to, please call 1-800-273-TALK (toll free, 24 hour hotline)  The patient verbalized understanding of instructions, educational materials, and care plan provided today.    Juanell Fairly RN, BSN, [facility] Care Coordinator [facility]   Phone: [phone]

## 2022-05-18 DIAGNOSIS — C61 Malignant neoplasm of prostate: Secondary | ICD-10-CM | POA: Diagnosis not present

## 2022-05-18 DIAGNOSIS — R351 Nocturia: Secondary | ICD-10-CM | POA: Diagnosis not present

## 2022-05-18 DIAGNOSIS — N471 Phimosis: Secondary | ICD-10-CM | POA: Diagnosis not present

## 2022-05-18 DIAGNOSIS — N481 Balanitis: Secondary | ICD-10-CM | POA: Diagnosis not present

## 2022-05-24 ENCOUNTER — Other Ambulatory Visit: Payer: Self-pay

## 2022-05-24 MED ORDER — ATORVASTATIN CALCIUM 20 MG PO TABS: 20.0000 mg | ORAL_TABLET | Freq: Every day | ORAL | 1 refills | Status: DC

## 2022-05-29 ENCOUNTER — Encounter: Payer: Self-pay | Admitting: *Deleted

## 2022-05-31 ENCOUNTER — Other Ambulatory Visit: Payer: Self-pay | Admitting: Internal Medicine

## 2022-05-31 ENCOUNTER — Ambulatory Visit: Payer: Self-pay

## 2022-05-31 NOTE — Patient Instructions (Signed)
Visit Information  Thank you for taking time to visit with me today. Please don't hesitate to contact me if I can be of assistance to you.   Following are the goals we discussed today:   Goals Addressed             This Visit's Progress    I want to manage my Afib       Patient Goals/Self Care Activities: -Patient/Caregiver will self-administer medications as prescribed as evidenced by self-report/primary caregiver report  -Patient/Caregiver will attend all scheduled provider appointments as evidenced by clinician review of documented attendance to scheduled appointments and patient/caregiver report -Patient/Caregiver will call pharmacy for medication refills as evidenced by patient report and review of pharmacy fill history as appropriate -Patient/Caregiver will call provider office for new concerns or questions as evidenced by review of documented incoming telephone call notes and patient report -Patient/Caregiver verbalizes understanding of plan -Patient/Caregiver will focus on medication adherence by taking medications as prescribed  -review your zone each day and know your atrial fibrillation (A-fib) Action Plan  - Continue to use you walker and cane when needed -De-clutter walkways -Change positions slowly -Have self awareness at all            Our next appointment is by telephone on 07/01/22 at 9 am  Please call the care guide team at [phone] if you need to cancel or reschedule your appointment.   If you are experiencing a Mental Health or Behavioral Health Crisis or need someone to talk to, please call 1-800-273-TALK (toll free, 24 hour hotline)  The patient verbalized understanding of instructions, educational materials, and care plan provided today.  Juanell Fairly RN, BSN, [facility] Care Coordinator [facility]   Phone: [phone]

## 2022-05-31 NOTE — Patient Outreach (Signed)
  Care Coordination   Follow Up Visit Note   05/31/2022 Name: Mark Nguyen MRN: [id number] DOB: 1931/07/09  Mark Nguyen is a 89 y.o. year old male who sees [place], Flossie Buffy, MD for primary care. I spoke with  Mark Nguyen by phone today.  What matters to the patients health and wellness today?  Mark Nguyen reported that he is doing alright. Last week, he fell while doing laundry and got tangled, but he only ended up with a skinned elbow. He denied having any issues with Afib. He visited a urologist, who prescribed Ditropan and fluconazole, which helped clear up his problems. He has follow-up appointments scheduled with the Urology department on the 26th and with Dr. Ladona Ridgel on June 14th.    Goals Addressed             This Visit's Progress    I want to manage my Afib       Patient Goals/Self Care Activities: -Patient/Caregiver will self-administer medications as prescribed as evidenced by self-report/primary caregiver report  -Patient/Caregiver will attend all scheduled provider appointments as evidenced by clinician review of documented attendance to scheduled appointments and patient/caregiver report -Patient/Caregiver will call pharmacy for medication refills as evidenced by patient report and review of pharmacy fill history as appropriate -Patient/Caregiver will call provider office for new concerns or questions as evidenced by review of documented incoming telephone call notes and patient report -Patient/Caregiver verbalizes understanding of plan -Patient/Caregiver will focus on medication adherence by taking medications as prescribed  -review your zone each day and know your atrial fibrillation (A-fib) Action Plan  - Continue to use you walker and cane when needed -De-clutter walkways -Change positions slowly -Have self awareness at all            SDOH assessments and interventions completed:  No     Care Coordination Interventions:  Yes, provided    Interventions Today    Flowsheet Row Most Recent Value  Chronic Disease   Chronic disease during today's visit Atrial Fibrillation (AFib)  General Interventions   General Interventions Discussed/Reviewed General Interventions Discussed  Pharmacy Interventions   Pharmacy Dicussed/Reviewed Pharmacy Topics Discussed  Safety Interventions   Safety Discussed/Reviewed Fall Risk, Safety Discussed        Follow up plan: Follow up call scheduled for 07/01/22  9 am    Encounter Outcome:  Pt. Visit Completed   Mark Fairly RN, BSN, [facility] Care Coordinator [facility]   Phone: [phone]

## 2022-06-13 ENCOUNTER — Other Ambulatory Visit: Payer: Self-pay | Admitting: Internal Medicine

## 2022-06-17 ENCOUNTER — Ambulatory Visit: Payer: Medicare Other | Attending: Internal Medicine | Admitting: Internal Medicine

## 2022-06-17 ENCOUNTER — Encounter: Payer: Self-pay | Admitting: Internal Medicine

## 2022-06-17 VITALS — BP 112/70 | HR 80 | Ht 67.0 in | Wt 172.0 lb

## 2022-06-17 DIAGNOSIS — I428 Other cardiomyopathies: Secondary | ICD-10-CM | POA: Insufficient documentation

## 2022-06-17 DIAGNOSIS — I5022 Chronic systolic (congestive) heart failure: Secondary | ICD-10-CM | POA: Diagnosis not present

## 2022-06-17 DIAGNOSIS — I4819 Other persistent atrial fibrillation: Secondary | ICD-10-CM | POA: Insufficient documentation

## 2022-06-17 MED ORDER — ISOSORBIDE MONONITRATE ER 30 MG PO TB24: 30.0000 mg | ORAL_TABLET | Freq: Every day | ORAL | 3 refills | Status: DC

## 2022-06-17 MED ORDER — LOSARTAN POTASSIUM 25 MG PO TABS
12.5000 mg | ORAL_TABLET | Freq: Every day | ORAL | 3 refills | Status: DC
Start: 2022-06-17 — End: 2023-03-24

## 2022-06-17 MED ORDER — FUROSEMIDE 40 MG PO TABS: ORAL_TABLET | ORAL | 3 refills | Status: DC

## 2022-06-17 MED ORDER — APIXABAN 5 MG PO TABS
5.0000 mg | ORAL_TABLET | Freq: Two times a day (BID) | ORAL | 5 refills | Status: DC
Start: 2022-06-17 — End: 2022-12-22

## 2022-06-17 MED ORDER — CARVEDILOL 25 MG PO TABS: 25.0000 mg | ORAL_TABLET | Freq: Two times a day (BID) | ORAL | 3 refills | Status: DC

## 2022-06-17 NOTE — Patient Instructions (Signed)
Medication Instructions:  Your physician recommends that you continue on your current medications as directed. Please refer to the Current Medication list given to you today.  *If you need a refill on your cardiac medications before your next appointment, please call your pharmacy*   Follow-Up: At [facility], you and your health needs are our priority.  As part of our continuing mission to provide you with exceptional heart care, we have created designated Provider Care Teams.  These Care Teams include your primary Cardiologist (physician) and Advanced Practice Providers (APPs -  Physician Assistants and Nurse Practitioners) who all work together to provide you with the care you need, when you need it.   Your next appointment:   1 year  Provider:   You may see Lewayne Bunting, MD or one of the following Advanced Practice Providers on your designated Care Team:   Francis Dowse, [place] "Mardelle Matte" [place], [place] Sherie Don, NP    Other Instructions

## 2022-06-17 NOTE — Progress Notes (Signed)
HPI [name] returns today for followup. He is a pleasant 89 yo man with a h/o chronic systolic heart failure and uncontrolled atrial fib who underwent AV node ablation last year. He feels much better. He denies syncope or sob. He did think he felt better with a HR of 80 than with a HR of 70. He has not had chest pain. No edema. He has a remote h/o VT and is s/p ablation with no recurrent VT. He denies edema.  No Known Allergies   Current Outpatient Medications  Medication Sig Dispense Refill   atorvastatin (LIPITOR) 20 MG tablet Take 1 tablet (20 mg total) by mouth at bedtime. 30 tablet 1   Cholecalciferol (VITAMIN D-3) 1000 UNITS CAPS Take 1,000 Units by mouth daily.      omeprazole (PRILOSEC) 20 MG capsule TAKE 1 CAPSULE DAILY 90 capsule 0   phenylephrine (SUDAFED PE) 10 MG TABS tablet Take 10 mg by mouth every 6 (six) hours as needed (congestion).     spironolactone (ALDACTONE) 25 MG tablet Take 0.5 tablets (12.5 mg total) by mouth daily. 45 tablet 3   Tamsulosin HCl (FLOMAX) 0.4 MG CAPS Take 0.4 mg by mouth daily after breakfast.      apixaban (ELIQUIS) 5 MG TABS tablet Take 1 tablet (5 mg total) by mouth 2 (two) times daily. 60 tablet 5   carvedilol (COREG) 25 MG tablet Take 1 tablet (25 mg total) by mouth 2 (two) times daily with a meal. 180 tablet 3   furosemide (LASIX) 40 MG tablet TAKE 1 TABLET BY MOUTH DAILY. HOLD DOSAGE IF WEIGHT < 183 POUNDS 90 tablet 3   isosorbide mononitrate (IMDUR) 30 MG 24 hr tablet Take 1 tablet (30 mg total) by mouth daily. 90 tablet 3   losartan (COZAAR) 25 MG tablet Take 0.5 tablets (12.5 mg total) by mouth at bedtime. 45 tablet 3   No current facility-administered medications for this visit.     Past Medical History:  Diagnosis Date   CAD (coronary artery disease)    CHF (congestive heart failure) (HCC)    Dyslipidemia    History of ventricular fibrillation    HTN (hypertension)    Hx-sudden cardiac arrest    Ischemic cardiomyopathy     LBBB (left bundle branch block)    Ventricular tachycardia (HCC)     ROS:   All systems reviewed and negative except as noted in the HPI.   Past Surgical History:  Procedure Laterality Date   AV NODE ABLATION N/A 12/25/2020   Procedure: AV NODE ABLATION;  Surgeon: Marinus Maw, MD;  Location: [facility] INVASIVE CV LAB;  Service: Cardiovascular;  Laterality: N/A;   BIV PACEMAKER INSERTION CRT-P N/A 12/25/2020   Procedure: DOWNGRADE BIV PACEMAKER INSERTION CRT-P;  Surgeon: Marinus Maw, MD;  Location: [facility] INVASIVE CV LAB;  Service: Cardiovascular;  Laterality: N/A;   CARDIAC CATHETERIZATION  10/12/2008   CARDIAC DEFIBRILLATOR PLACEMENT     St Jude   CARDIOVERSION N/A 06/28/2019   Procedure: CARDIOVERSION;  Surgeon: Lewayne Bunting, MD;  Location: [facility] ENDOSCOPY;  Service: Cardiovascular;  Laterality: N/A;   CARDIOVERSION N/A 02/14/2020   Procedure: CARDIOVERSION;  Surgeon: Thurmon Fair, MD;  Location: [facility] ENDOSCOPY;  Service: Cardiovascular;  Laterality: N/A;   DOPPLER ECHOCARDIOGRAPHY  2008, 2011   EP IMPLANTABLE DEVICE N/A 05/19/2014   Procedure: ICD/BIV ICD Generator Changeout;  Surgeon: Marinus Maw, MD;  Location: [facility] INVASIVE CV LAB;  Service: Cardiovascular;  Laterality: N/A;  TONSILECTOMY, ADENOIDECTOMY, BILATERAL MYRINGOTOMY AND TUBES       Family History  Problem Relation Age of Onset   Coronary artery disease Other      Social History   Socioeconomic History   Marital status: Divorced    Spouse name: Not on file   Number of children: Not on file   Years of education: Not on file   Highest education level: Not on file  Occupational History   Occupation: furniture truck driver  Tobacco Use   Smoking status: Former    Types: Cigarettes    Quit date: 01/03/1985    Years since quitting: 37.4   Smokeless tobacco: Never  Vaping Use   Vaping Use: Never used  Substance and Sexual Activity   Alcohol use: Never   Drug use: Never   Sexual activity: Not on file   Other Topics Concern   Not on file  Social History Narrative   Not on file   Social Determinants of Health   Financial Resource Strain: Low Risk  (06/28/2021)   Overall Financial Resource Strain (CARDIA)    Difficulty of Paying Living Expenses: Not hard at all  Food Insecurity: No Food Insecurity (06/28/2021)   Hunger Vital Sign    Worried About Running Out of Food in the Last Year: Never true    Ran Out of Food in the Last Year: Never true  Transportation Needs: No Transportation Needs (06/28/2021)   PRAPARE - Administrator, Civil Service (Medical): No    Lack of Transportation (Non-Medical): No  Physical Activity: Insufficiently Active (06/28/2021)   Exercise Vital Sign    Days of Exercise per Week: 4 days    Minutes of Exercise per Session: 30 min  Stress: No Stress Concern Present (06/28/2021)   [employer] of Occupational Health - Occupational Stress Questionnaire    Feeling of Stress : Not at all  Social Connections: Socially Isolated (06/28/2021)   Social Connection and Isolation Panel [NHANES]    Frequency of Communication with Friends and Family: Never    Frequency of Social Gatherings with Friends and Family: More than three times a week    Attends Religious Services: Never    Database administrator or Organizations: No    Attends Banker Meetings: Never    Marital Status: Divorced  Catering manager Violence: Not At Risk (06/28/2021)   Humiliation, Afraid, Rape, and Kick questionnaire    Fear of Current or Ex-Partner: No    Emotionally Abused: No    Physically Abused: No    Sexually Abused: No     BP 112/70   Pulse 80   Ht 5\' 7"  (1.702 m)   Wt 172 lb (78 kg)   SpO2 98%   BMI 26.94 kg/m   Physical Exam:  Well appearing NAD HEENT: Unremarkable Neck:  No JVD, no thyromegally Lymphatics:  No adenopathy Back:  No CVA tenderness Lungs:  Clear HEART:  Regular rate rhythm, no murmurs, no rubs, no clicks Abd:  soft, positive bowel  sounds, no organomegally, no rebound, no guarding Ext:  2 plus pulses, no edema, no cyanosis, no clubbing Skin:  No rashes no nodules Neuro:  CN II through XII intact, motor grossly intact  EKG - atrial fib with ventricular pacing  DEVICE  Normal device function.  See PaceArt for details.   Assess/Plan:  Uncontrolled atrial fib - he is s/p AV node ablation and is doing well. No change in meds. ICD - He is s/p  down grade and his St. Jude biv PPM is working normally. We will recheck in several months. VT - he is s/p remote ablation and is doing well. We will follow. Chronic systolic heart failure - his symptoms are back to class 2 since his VR is controlled.   Mark Gowda Javona Bergevin,MD

## 2022-06-20 ENCOUNTER — Other Ambulatory Visit: Payer: Self-pay | Admitting: Internal Medicine

## 2022-06-24 ENCOUNTER — Ambulatory Visit: Payer: Medicare Other

## 2022-06-24 DIAGNOSIS — I428 Other cardiomyopathies: Secondary | ICD-10-CM

## 2022-06-24 DIAGNOSIS — I4729 Other ventricular tachycardia: Secondary | ICD-10-CM

## 2022-06-24 LAB — CUP PACEART REMOTE DEVICE CHECK
Battery Remaining Longevity: 59 mo
Battery Remaining Percentage: 82 %
Battery Voltage: 2.98 V
Date Time Interrogation Session: 20240621040013
Implantable Lead Connection Status: 753985
Implantable Lead Connection Status: 753985
Implantable Lead Connection Status: 753985
Implantable Lead Implant Date: 20030319
Implantable Lead Implant Date: 20030319
Implantable Lead Implant Date: 20110211
Implantable Lead Location: 753858
Implantable Lead Location: 753859
Implantable Lead Location: 753860
Implantable Lead Model: 158
Implantable Lead Model: 4087
Implantable Lead Serial Number: 113412
Implantable Lead Serial Number: 159867
Implantable Pulse Generator Implant Date: 20221223
Lead Channel Impedance Value: 400 Ohm
Lead Channel Impedance Value: 640 Ohm
Lead Channel Pacing Threshold Amplitude: 0.5 V
Lead Channel Pacing Threshold Amplitude: 1.75 V
Lead Channel Pacing Threshold Pulse Width: 0.5 ms
Lead Channel Pacing Threshold Pulse Width: 1 ms
Lead Channel Sensing Intrinsic Amplitude: 8.5 mV
Lead Channel Setting Pacing Amplitude: 2.5 V
Lead Channel Setting Pacing Amplitude: 2.5 V
Lead Channel Setting Pacing Pulse Width: 0.5 ms
Lead Channel Setting Pacing Pulse Width: 1 ms
Lead Channel Setting Sensing Sensitivity: 2 mV
Pulse Gen Model: 3222
Pulse Gen Serial Number: 3978258

## 2022-06-29 DIAGNOSIS — C61 Malignant neoplasm of prostate: Secondary | ICD-10-CM | POA: Diagnosis not present

## 2022-06-29 DIAGNOSIS — R351 Nocturia: Secondary | ICD-10-CM | POA: Diagnosis not present

## 2022-06-29 DIAGNOSIS — N471 Phimosis: Secondary | ICD-10-CM | POA: Diagnosis not present

## 2022-07-12 NOTE — Progress Notes (Signed)
Remote pacemaker transmission.   

## 2022-07-21 ENCOUNTER — Other Ambulatory Visit: Payer: Self-pay

## 2022-07-21 MED ORDER — ATORVASTATIN CALCIUM 20 MG PO TABS: 20.0000 mg | ORAL_TABLET | Freq: Every day | ORAL | 3 refills | Status: DC

## 2022-07-27 ENCOUNTER — Ambulatory Visit (INDEPENDENT_AMBULATORY_CARE_PROVIDER_SITE_OTHER): Payer: Medicare Other

## 2022-07-27 VITALS — Ht 67.0 in | Wt 175.0 lb

## 2022-07-27 DIAGNOSIS — Z0000 Encounter for general adult medical examination without abnormal findings: Secondary | ICD-10-CM | POA: Diagnosis not present

## 2022-07-27 NOTE — Patient Instructions (Signed)
[name] , Thank you for taking time to come for your Medicare Wellness Visit. I appreciate your ongoing commitment to your health goals. Please review the following plan we discussed and let me know if I can assist you in the future.   These are the goals we discussed:  Goals      I want to manage my Afib     Patient Goals/Self Care Activities: -Patient/Caregiver will self-administer medications as prescribed as evidenced by self-report/primary caregiver report  -Patient/Caregiver will attend all scheduled provider appointments as evidenced by clinician review of documented attendance to scheduled appointments and patient/caregiver report -Patient/Caregiver will call pharmacy for medication refills as evidenced by patient report and review of pharmacy fill history as appropriate -Patient/Caregiver will call provider office for new concerns or questions as evidenced by review of documented incoming telephone call notes and patient report -Patient/Caregiver verbalizes understanding of plan -Patient/Caregiver will focus on medication adherence by taking medications as prescribed  -review your zone each day and know your atrial fibrillation (A-fib) Action Plan  - Continue to use you walker and cane when needed -De-clutter walkways -Change positions slowly -Have self awareness at all            This is a list of the screening recommended for you and due dates:  Health Maintenance  Topic Date Due   DTaP/Tdap/Td vaccine (1 - Tdap) Never done   Zoster (Shingles) Vaccine (1 of 2) Never done   Pneumonia Vaccine (1 of 1 - PCV) Never done   COVID-19 Vaccine (3 - Pfizer risk series) 03/06/2019   Flu Shot  08/04/2022   Medicare Annual Wellness Visit  07/27/2023   HPV Vaccine  Aged Out    Advanced directives: Please bring a copy of your health care power of attorney and living will to the office to be added to your chart at your convenience.   Conditions/risks identified: Each day, aim for  6 glasses of water, plenty of protein in your diet and try to get up and walk/ stretch every hour for 5-10 minutes at a time.    Next appointment: Follow up in one year for your annual wellness visit.   Preventive Care 56 Years and Older, Male  Preventive care refers to lifestyle choices and visits with your health care provider that can promote health and wellness. What does preventive care include? A yearly physical exam. This is also called an annual well check. Dental exams once or twice a year. Routine eye exams. Ask your health care provider how often you should have your eyes checked. Personal lifestyle choices, including: Daily care of your teeth and gums. Regular physical activity. Eating a healthy diet. Avoiding tobacco and drug use. Limiting alcohol use. Practicing safe sex. Taking low doses of aspirin every day. Taking vitamin and mineral supplements as recommended by your health care provider. What happens during an annual well check? The services and screenings done by your health care provider during your annual well check will depend on your age, overall health, lifestyle risk factors, and family history of disease. Counseling  Your health care provider may ask you questions about your: Alcohol use. Tobacco use. Drug use. Emotional well-being. Home and relationship well-being. Sexual activity. Eating habits. History of falls. Memory and ability to understand (cognition). Work and work Astronomer. Screening  You may have the following tests or measurements: Height, weight, and BMI. Blood pressure. Lipid and cholesterol levels. These may be checked every 5 years, or more frequently if you  are over 67 years old. Skin check. Lung cancer screening. You may have this screening every year starting at age 44 if you have a 30-pack-year history of smoking and currently smoke or have quit within the past 15 years. Fecal occult blood test (FOBT) of the stool. You may  have this test every year starting at age 60. Flexible sigmoidoscopy or colonoscopy. You may have a sigmoidoscopy every 5 years or a colonoscopy every 10 years starting at age 84. Prostate cancer screening. Recommendations will vary depending on your family history and other risks. Hepatitis C blood test. Hepatitis B blood test. Sexually transmitted disease (STD) testing. Diabetes screening. This is done by checking your blood sugar (glucose) after you have not eaten for a while (fasting). You may have this done every 1-3 years. Abdominal aortic aneurysm (AAA) screening. You may need this if you are a current or former smoker. Osteoporosis. You may be screened starting at age 62 if you are at high risk. Talk with your health care provider about your test results, treatment options, and if necessary, the need for more tests. Vaccines  Your health care provider may recommend certain vaccines, such as: Influenza vaccine. This is recommended every year. Tetanus, diphtheria, and acellular pertussis (Tdap, Td) vaccine. You may need a Td booster every 10 years. Zoster vaccine. You may need this after age 59. Pneumococcal 13-valent conjugate (PCV13) vaccine. One dose is recommended after age 70. Pneumococcal polysaccharide (PPSV23) vaccine. One dose is recommended after age 75. Talk to your health care provider about which screenings and vaccines you need and how often you need them. This information is not intended to replace advice given to you by your health care provider. Make sure you discuss any questions you have with your health care provider. Document Released: 01/16/2015 Document Revised: 09/09/2015 Document Reviewed: 10/21/2014 Elsevier Interactive Patient Education  2017 [employer].  Fall Prevention in the Home Falls can cause injuries. They can happen to people of all ages. There are many things you can do to make your home safe and to help prevent falls. What can I do on the outside  of my home? Regularly fix the edges of walkways and driveways and fix any cracks. Remove anything that might make you trip as you walk through a door, such as a raised step or threshold. Trim any bushes or trees on the path to your home. Use bright outdoor lighting. Clear any walking paths of anything that might make someone trip, such as rocks or tools. Regularly check to see if handrails are loose or broken. Make sure that both sides of any steps have handrails. Any raised decks and porches should have guardrails on the edges. Have any leaves, snow, or ice cleared regularly. Use sand or salt on walking paths during winter. Clean up any spills in your garage right away. This includes oil or grease spills. What can I do in the bathroom? Use night lights. Install grab bars by the toilet and in the tub and shower. Do not use towel bars as grab bars. Use non-skid mats or decals in the tub or shower. If you need to sit down in the shower, use a plastic, non-slip stool. Keep the floor dry. Clean up any water that spills on the floor as soon as it happens. Remove soap buildup in the tub or shower regularly. Attach bath mats securely with double-sided non-slip rug tape. Do not have throw rugs and other things on the floor that can make you trip.  What can I do in the bedroom? Use night lights. Make sure that you have a light by your bed that is easy to reach. Do not use any sheets or blankets that are too big for your bed. They should not hang down onto the floor. Have a firm chair that has side arms. You can use this for support while you get dressed. Do not have throw rugs and other things on the floor that can make you trip. What can I do in the kitchen? Clean up any spills right away. Avoid walking on wet floors. Keep items that you use a lot in easy-to-reach places. If you need to reach something above you, use a strong step stool that has a grab bar. Keep electrical cords out of the  way. Do not use floor polish or wax that makes floors slippery. If you must use wax, use non-skid floor wax. Do not have throw rugs and other things on the floor that can make you trip. What can I do with my stairs? Do not leave any items on the stairs. Make sure that there are handrails on both sides of the stairs and use them. Fix handrails that are broken or loose. Make sure that handrails are as long as the stairways. Check any carpeting to make sure that it is firmly attached to the stairs. Fix any carpet that is loose or worn. Avoid having throw rugs at the top or bottom of the stairs. If you do have throw rugs, attach them to the floor with carpet tape. Make sure that you have a light switch at the top of the stairs and the bottom of the stairs. If you do not have them, ask someone to add them for you. What else can I do to help prevent falls? Wear shoes that: Do not have high heels. Have rubber bottoms. Are comfortable and fit you well. Are closed at the toe. Do not wear sandals. If you use a stepladder: Make sure that it is fully opened. Do not climb a closed stepladder. Make sure that both sides of the stepladder are locked into place. Ask someone to hold it for you, if possible. Clearly mark and make sure that you can see: Any grab bars or handrails. First and last steps. Where the edge of each step is. Use tools that help you move around (mobility aids) if they are needed. These include: Canes. Walkers. Scooters. Crutches. Turn on the lights when you go into a dark area. Replace any light bulbs as soon as they burn out. Set up your furniture so you have a clear path. Avoid moving your furniture around. If any of your floors are uneven, fix them. If there are any pets around you, be aware of where they are. Review your medicines with your doctor. Some medicines can make you feel dizzy. This can increase your chance of falling. Ask your doctor what other things that you can  do to help prevent falls. This information is not intended to replace advice given to you by your health care provider. Make sure you discuss any questions you have with your health care provider. Document Released: 10/16/2008 Document Revised: 05/28/2015 Document Reviewed: 01/24/2014 Elsevier Interactive Patient Education  2017 [employer].

## 2022-07-27 NOTE — Progress Notes (Signed)
Subjective:   Mark Nguyen is a 89 y.o. male who presents for Medicare Annual/Subsequent preventive examination.  Visit Complete: Virtual  I connected with  [name] on 07/27/22 by a audio enabled telemedicine application and verified that I am speaking with the correct person using two identifiers.  Patient Location: Home  Provider Location: Office/Clinic  I discussed the limitations of evaluation and management by telemedicine. The patient expressed understanding and agreed to proceed.  Per patient no change in vitals since last visit; unable to obtain new vitals due to this being a telehealth visit.   Review of Systems    Cardiac Risk Factors include: advanced age (>65men, >37 women);male gender;hypertension;dyslipidemia;Other (see comment), Risk factor comments: A-fib, Cardiac arrest, SHF, Coronary Atherosclerosis     Objective:    Today's Vitals   07/27/22 0859  Weight: 175 lb (79.4 kg)  Height: 5\' 7"  (1.702 m)   Body mass index is 27.41 kg/m.     07/28/2021    2:12 PM 06/28/2021   10:49 AM 06/28/2021    9:51 AM 05/28/2021    9:31 AM 06/28/2019    9:27 AM 02/19/2019    3:33 PM 12/07/2018   11:03 AM  Advanced Directives  Does Patient Have a Medical Advance Directive? No No No No No No No  Would patient like information on creating a medical advance directive? No - Patient declined No - Patient declined No - Patient declined No - Patient declined No - Patient declined No - Patient declined No - Patient declined    Current Medications (verified) Outpatient Encounter Medications as of 07/27/2022  Medication Sig   apixaban (ELIQUIS) 5 MG TABS tablet Take 1 tablet (5 mg total) by mouth 2 (two) times daily.   atorvastatin (LIPITOR) 20 MG tablet Take 1 tablet (20 mg total) by mouth at bedtime.   carvedilol (COREG) 25 MG tablet Take 1 tablet (25 mg total) by mouth 2 (two) times daily with a meal.   Cholecalciferol (VITAMIN D-3) 1000 UNITS CAPS Take 1,000 Units by  mouth daily.    furosemide (LASIX) 40 MG tablet TAKE 1 TABLET BY MOUTH DAILY. HOLD DOSAGE IF WEIGHT < 183 POUNDS   isosorbide mononitrate (IMDUR) 30 MG 24 hr tablet Take 1 tablet (30 mg total) by mouth daily.   losartan (COZAAR) 25 MG tablet Take 0.5 tablets (12.5 mg total) by mouth at bedtime.   omeprazole (PRILOSEC) 20 MG capsule TAKE 1 CAPSULE DAILY   phenylephrine (SUDAFED PE) 10 MG TABS tablet Take 10 mg by mouth every 6 (six) hours as needed (congestion).   spironolactone (ALDACTONE) 25 MG tablet Take 0.5 tablets (12.5 mg total) by mouth daily.   Tamsulosin HCl (FLOMAX) 0.4 MG CAPS Take 0.4 mg by mouth daily after breakfast.    No facility-administered encounter medications on file as of 07/27/2022.    Allergies (verified) Patient has no known allergies.   History: Past Medical History:  Diagnosis Date   CAD (coronary artery disease)    CHF (congestive heart failure) (HCC)    Dyslipidemia    History of ventricular fibrillation    HTN (hypertension)    Hx-sudden cardiac arrest    Ischemic cardiomyopathy    LBBB (left bundle branch block)    Ventricular tachycardia (HCC)    Past Surgical History:  Procedure Laterality Date   AV NODE ABLATION N/A 12/25/2020   Procedure: AV NODE ABLATION;  Surgeon: Marinus Maw, MD;  Location: [facility] INVASIVE CV LAB;  Service: Cardiovascular;  Laterality: N/A;   BIV PACEMAKER INSERTION CRT-P N/A 12/25/2020   Procedure: DOWNGRADE BIV PACEMAKER INSERTION CRT-P;  Surgeon: Marinus Maw, MD;  Location: [facility] INVASIVE CV LAB;  Service: Cardiovascular;  Laterality: N/A;   CARDIAC CATHETERIZATION  10/12/2008   CARDIAC DEFIBRILLATOR PLACEMENT     St Jude   CARDIOVERSION N/A 06/28/2019   Procedure: CARDIOVERSION;  Surgeon: Lewayne Bunting, MD;  Location: [facility] ENDOSCOPY;  Service: Cardiovascular;  Laterality: N/A;   CARDIOVERSION N/A 02/14/2020   Procedure: CARDIOVERSION;  Surgeon: Thurmon Fair, MD;  Location: [facility] ENDOSCOPY;  Service: Cardiovascular;   Laterality: N/A;   DOPPLER ECHOCARDIOGRAPHY  2008, 2011   EP IMPLANTABLE DEVICE N/A 05/19/2014   Procedure: ICD/BIV ICD Generator Changeout;  Surgeon: Marinus Maw, MD;  Location: [facility] INVASIVE CV LAB;  Service: Cardiovascular;  Laterality: N/A;   TONSILECTOMY, ADENOIDECTOMY, BILATERAL MYRINGOTOMY AND TUBES     Family History  Problem Relation Age of Onset   Coronary artery disease Other    Social History   Socioeconomic History   Marital status: Divorced    Spouse name: Not on file   Number of children: Not on file   Years of education: Not on file   Highest education level: Not on file  Occupational History   Occupation: furniture truck driver  Tobacco Use   Smoking status: Former    Current packs/day: 0.00    Types: Cigarettes    Quit date: 01/03/1985    Years since quitting: 37.5   Smokeless tobacco: Never  Vaping Use   Vaping status: Never Used  Substance and Sexual Activity   Alcohol use: Never   Drug use: Never   Sexual activity: Not on file  Other Topics Concern   Not on file  Social History Narrative   Not on file   Social Determinants of Health   Financial Resource Strain: Low Risk  (07/27/2022)   Overall Financial Resource Strain (CARDIA)    Difficulty of Paying Living Expenses: Not hard at all  Food Insecurity: No Food Insecurity (07/27/2022)   Hunger Vital Sign    Worried About Running Out of Food in the Last Year: Never true    Ran Out of Food in the Last Year: Never true  Transportation Needs: No Transportation Needs (07/27/2022)   PRAPARE - Administrator, Civil Service (Medical): No    Lack of Transportation (Non-Medical): No  Physical Activity: Insufficiently Active (07/27/2022)   Exercise Vital Sign    Days of Exercise per Week: 4 days    Minutes of Exercise per Session: 30 min  Stress: No Stress Concern Present (07/27/2022)   [employer] of Occupational Health - Occupational Stress Questionnaire    Feeling of Stress : Not at all   Social Connections: Socially Isolated (07/27/2022)   Social Connection and Isolation Panel [NHANES]    Frequency of Communication with Friends and Family: Never    Frequency of Social Gatherings with Friends and Family: More than three times a week    Attends Religious Services: Never    Database administrator or Organizations: No    Attends Engineer, structural: Never    Marital Status: Divorced    Tobacco Counseling Counseling given: Not Answered   Clinical Intake:  Pre-visit preparation completed: Yes  Pain : No/denies pain     BMI - recorded: 27.41 Nutritional Status: BMI 25 -29 Overweight Nutritional Risks: None Diabetes: No  How often do you need to have someone help you when you  read instructions, pamphlets, or other written materials from your doctor or pharmacy?: 1 - Never  Interpreter Needed?: No  Information entered by :: Daymen Hassebrock, RMA   Activities of Daily Living    07/27/2022    9:09 AM 08/26/2021   10:14 AM  In your present state of health, do you have any difficulty performing the following activities:  Hearing? 1 1  Vision? 1 0  Difficulty concentrating or making decisions? 0 0  Walking or climbing stairs? 1 1  Dressing or bathing? 0 0  Doing errands, shopping? 0 1  Preparing Food and eating ? N   Using the Toilet? N   In the past six months, have you accidently leaked urine? N   Do you have problems with loss of bowel control? N   Managing your Medications? N   Managing your Finances? N   Housekeeping or managing your Housekeeping? N     Patient Care Team: [name], MD as PCP - General Ladona Ridgel Doylene Canning, MD as PCP - Electrophysiology (Cardiology) Graciella Freer, PA-C as Physician Assistant (Physician Assistant) Steffanie Rainwater, MD as Resident (Internal Medicine) Juanell Fairly, RN as [facility] Care Management  Indicate any recent Medical Services you may have received from other than [facility] providers  in the past year (date may be approximate).     Assessment:   This is a routine wellness examination for Kashtyn.  Hearing/Vision screen Hearing Screening - Comments:: Per patient-hard of hearing in especially Rt ear. Vision Screening - Comments:: Wears glasses  Dietary issues and exercise activities discussed:     Goals Addressed   None   Depression Screen    07/28/2021    2:12 PM 06/28/2021   10:46 AM 06/28/2021    9:48 AM 05/28/2021    9:32 AM 10/27/2020   10:31 AM 09/10/2019   12:41 PM 06/10/2019   12:30 PM  PHQ 2/9 Scores  PHQ - 2 Score 0 0 0 0 0 0 0    Fall Risk    07/27/2022    9:12 AM 07/28/2021    2:11 PM 06/28/2021   10:49 AM 06/28/2021    9:48 AM 05/28/2021    9:21 AM  Fall Risk   Falls in the past year? 1 1 1 1  0  Number falls in past yr: 0 0 0 0 0  Injury with Fall? 1 1 1  0 0  Comment per patient broke his hip      Risk for fall due to : History of fall(s);Impaired balance/gait;Orthopedic patient No Fall Risks Impaired balance/gait Impaired balance/gait   Follow up Education provided;Falls prevention discussed;Falls evaluation completed Falls evaluation completed;Falls prevention discussed Falls evaluation completed;Falls prevention discussed Falls evaluation completed;Falls prevention discussed Falls evaluation completed    MEDICARE RISK AT HOME:  Medicare Risk at Home - 07/27/22 0913     Any stairs in or around the home? No    Home free of loose throw rugs in walkways, pet beds, electrical cords, etc? Yes    Adequate lighting in your home to reduce risk of falls? Yes    Life alert? No    Use of a cane, walker or w/c? Yes    Grab bars in the bathroom? Yes    Shower chair or bench in shower? Yes    Elevated toilet seat or a handicapped toilet? Yes             TIMED UP AND GO:  Was the test performed?  No  Cognitive Function:        07/27/2022    9:15 AM 06/28/2021   10:51 AM  6CIT Screen  What Year? 0 points 0 points  What month? 0 points 0  points  What time? 0 points 0 points  Count back from 20 0 points 0 points  Months in reverse --   Repeat phrase 0 points 0 points    Immunizations Immunization History  Administered Date(s) Administered   Fluad Quad(high Dose 65+) 10/24/2018   Influenza-Unspecified 10/06/2017, 10/17/2019, 10/12/2020   PFIZER(Purple Top)SARS-COV-2 Vaccination 01/16/2019, 02/06/2019    TDAP status: Due, Education has been provided regarding the importance of this vaccine. Advised may receive this vaccine at local pharmacy or Health Dept. Aware to provide a copy of the vaccination record if obtained from local pharmacy or Health Dept. Verbalized acceptance and understanding.  Flu Vaccine status: Up to date  Pneumococcal vaccine status: Declined,  Education has been provided regarding the importance of this vaccine but patient still declined. Advised may receive this vaccine at local pharmacy or Health Dept. Aware to provide a copy of the vaccination record if obtained from local pharmacy or Health Dept. Verbalized acceptance and understanding.   Covid-19 vaccine status: Completed vaccines  Qualifies for Shingles Vaccine? Yes   Zostavax completed No   Shingrix Completed?: No.    Education has been provided regarding the importance of this vaccine. Patient has been advised to call insurance company to determine out of pocket expense if they have not yet received this vaccine. Advised may also receive vaccine at local pharmacy or Health Dept. Verbalized acceptance and understanding.  Screening Tests Health Maintenance  Topic Date Due   DTaP/Tdap/Td (1 - Tdap) Never done   Zoster Vaccines- Shingrix (1 of 2) Never done   Pneumonia Vaccine 20+ Years old (1 of 1 - PCV) Never done   COVID-19 Vaccine (3 - Pfizer risk series) 03/06/2019   INFLUENZA VACCINE  08/04/2022   Medicare Annual Wellness (AWV)  07/27/2023   HPV VACCINES  Aged Out    Health Maintenance  Health Maintenance Due  Topic Date Due    DTaP/Tdap/Td (1 - Tdap) Never done   Zoster Vaccines- Shingrix (1 of 2) Never done   Pneumonia Vaccine 40+ Years old (1 of 1 - PCV) Never done   COVID-19 Vaccine (3 - Pfizer risk series) 03/06/2019    Colorectal cancer screening: No longer required.   Lung Cancer Screening: (Low Dose CT Chest recommended if Age 42-80 years, 20 pack-year currently smoking OR have quit w/in 15years.) does not qualify.   Lung Cancer Screening Referral: N/A  Additional Screening:  Hepatitis C Screening: does not qualify; Completed N/A  Vision Screening: Recommended annual ophthalmology exams for early detection of glaucoma and other disorders of the eye. Is the patient up to date with their annual eye exam?  Yes  Who is the provider or what is the name of the office in which the patient attends annual eye exams? [facility] ([place], [place]) If pt is not established with a provider, would they like to be referred to a provider to establish care? No .   Dental Screening: Recommended annual dental exams for proper oral hygiene   Community Resource Referral / Chronic Care Management: CRR required this visit?  No   CCM required this visit?  No     Plan:     I have personally reviewed and noted the following in the patient's chart:   Medical and social history Use  of alcohol, tobacco or illicit drugs  Current medications and supplements including opioid prescriptions. Patient is not currently taking opioid prescriptions. Functional ability and status Nutritional status Physical activity Advanced directives List of other physicians Hospitalizations, surgeries, and ER visits in previous 12 months Vitals Screenings to include cognitive, depression, and falls Referrals and appointments  In addition, I have reviewed and discussed with patient certain preventive protocols, quality metrics, and best practice recommendations. A written personalized care plan for preventive services as well as general  preventive health recommendations were provided to patient.     Moet Mikulski L Paden Senger, CMA   07/27/2022   After Visit Summary: (Mail) Due to this being a telephonic visit, the after visit summary with patients personalized plan was offered to patient via mail   Nurse Notes: Patient had cataracts surgery 2 months ago with [facility]ily[facility] C[facility].

## 2022-08-09 ENCOUNTER — Ambulatory Visit: Payer: Self-pay

## 2022-08-09 NOTE — Patient Outreach (Signed)
  Care Coordination   Follow Up Visit Note   08/09/2022 Name: Mark Nguyen MRN: [id number] DOB: [birth year]  Mark Nguyen is a 89 y.o. year old male who sees [name], MD for primary care. I spoke with  Mark Nguyen by phone today.  What matters to the patients health and wellness today?   Mark Nguyen, who had a urine infection that has now cleared up, is scheduled for a follow-up with his urologist on 10/25/22. He continues to use his walker, particularly in the morning and at night. He had a minor fall a week ago while cleaning his tub, but he was not seriously injured, just a bit sore in his shoulder. He has taken steps to prevent such incidents in the future by placing a non skid mat in the tub.   He has not reported any issues with Afib and is still undergoing remote checks.    Goals Addressed             This Visit's Progress    I want to manage my Afib       Patient Goals/Self Care Activities: -Patient/Caregiver will self-administer medications as prescribed as evidenced by self-report/primary caregiver report  -Patient/Caregiver will attend all scheduled provider appointments as evidenced by clinician review of documented attendance to scheduled appointments and patient/caregiver report --Patient/Caregiver will call provider office for new concerns or questions as evidenced by review of documented incoming telephone call notes and patient report -Patient/Caregiver will focus on medication adherence by taking medications as prescribed  -review your zone each day and know your atrial fibrillation (A-fib) Action Plan  - Continue to use you walker and cane when needed -De-clutter walkways -Change positions slowly -Have self awareness at all times          SDOH assessments and interventions completed:  No     Care Coordination Interventions:  Yes, provided   Interventions Today    Flowsheet Row Most Recent Value  Chronic Disease   Chronic disease during today's  visit Atrial Fibrillation (AFib)  General Interventions   General Interventions Discussed/Reviewed General Interventions Discussed  Education Interventions   Education Provided Provided Education  Pharmacy Interventions   Pharmacy Dicussed/Reviewed Pharmacy Topics Discussed  Safety Interventions   Safety Discussed/Reviewed Safety Discussed, Safety Reviewed        Follow up plan: Follow up call scheduled for 09/09/22 9  am    Encounter Outcome:  Pt. Visit Completed   Juanell Fairly RN, BSN, [facility] Care Coordinator [facility]   Phone: [phone]

## 2022-08-09 NOTE — Patient Instructions (Signed)
Visit Information  Thank you for taking time to visit with me today. Please don't hesitate to contact me if I can be of assistance to you.   Following are the goals we discussed today:   Goals Addressed             This Visit's Progress    I want to manage my Afib       Patient Goals/Self Care Activities: -Patient/Caregiver will self-administer medications as prescribed as evidenced by self-report/primary caregiver report  -Patient/Caregiver will attend all scheduled provider appointments as evidenced by clinician review of documented attendance to scheduled appointments and patient/caregiver report --Patient/Caregiver will call provider office for new concerns or questions as evidenced by review of documented incoming telephone call notes and patient report -Patient/Caregiver will focus on medication adherence by taking medications as prescribed  -review your zone each day and know your atrial fibrillation (A-fib) Action Plan  - Continue to use you walker and cane when needed -Have self awareness at all times          Our next appointment is by telephone on 09/09/22 at 9 am  Please call the care guide team at [phone] if you need to cancel or reschedule your appointment.   If you are experiencing a Mental Health or Behavioral Health Crisis or need someone to talk to, please call 1-800-273-TALK (toll free, 24 hour hotline)  The patient verbalized understanding of instructions, educational materials, and care plan provided today and agreed to receive a mailed copy of patient instructions, educational materials, and care plan.   Juanell Fairly RN, BSN, [facility] Care Coordinator [facility]   Phone: [phone]

## 2022-08-11 NOTE — Progress Notes (Signed)
Internal Medicine Clinic Attending  Case, documentation, and findings reviewed. I agree with the assessment, diagnosis, and plan of care as outlined in the AWV note.     

## 2022-09-02 ENCOUNTER — Other Ambulatory Visit: Payer: Self-pay

## 2022-09-02 DIAGNOSIS — I5022 Chronic systolic (congestive) heart failure: Secondary | ICD-10-CM

## 2022-09-02 MED ORDER — SPIRONOLACTONE 25 MG PO TABS
12.5000 mg | ORAL_TABLET | Freq: Every day | ORAL | 2 refills | Status: DC
Start: 2022-09-02 — End: 2023-05-31

## 2022-09-09 ENCOUNTER — Ambulatory Visit: Payer: Self-pay

## 2022-09-09 NOTE — Patient Instructions (Signed)
Visit Information  Thank you for taking time to visit with me today. Please don't hesitate to contact me if I can be of assistance to you.   Following are the goals we discussed today:   Goals Addressed             This Visit's Progress    I want to manage my Afib       Patient Goals/Self Care Activities: -Patient/Caregiver will take medications as prescribed   -Patient/Caregiver will attend all scheduled provider appointments -Patient/Caregiver will call pharmacy for medication refills 3-7 days in advance of running out of medications -Patient/Caregiver will call provider office for new concerns or questions  -Patient/Caregiver will focus on medication adherence by taking medications as prescribed   -review your zone each day and know your atrial fibrillation (A-fib) Action Plan  - Continue to use you walker and cane when needed -Have self awareness at all times -Make sure to pick up your spironolactone 25 mg from the drugstore           Our next appointment is by telephone on 10/11/22 at 9 am  Please call the care guide team at [phone] if you need to cancel or reschedule your appointment.   If you are experiencing a Mental Health or Behavioral Health Crisis or need someone to talk to, please call 1-800-273-TALK (toll free, 24 hour hotline)  The patient verbalized understanding of instructions, educational materials, and care plan provided today and agreed to receive a mailed copy of patient instructions, educational materials, and care plan.   Juanell Fairly RN, BSN, [facility] Phone: [phone]

## 2022-09-09 NOTE — Patient Outreach (Signed)
  Care Coordination   Follow Up Visit Note   09/09/2022 Name: Mark Nguyen MRN: [id number] DOB: 18-Jun-1931  Mark Nguyen is a 89 y.o. year old male who sees Mark Fam, MD for primary care. I spoke with  Mark Nguyen by phone today.  What matters to the patients health and wellness today?  Mark Nguyen experienced a restful night's sleep. This morning, he reported feeling fatigued, primarily due to the necessity of using the restroom. In all other aspects, his condition remains favorable, with no complications related to AFIB. He adheres to his medication regimen and maintains a wholesome diet. He conveyed that he was waiting for the arrival of his 25 mg spironolactone. Upon reviewing his prescriptions, I confirmed that his physician had sent the medication to the pharmacy.    Goals Addressed             This Visit's Progress    I want to manage my Afib       Patient Goals/Self Care Activities: -Patient/Caregiver will take medications as prescribed   -Patient/Caregiver will attend all scheduled provider appointments -Patient/Caregiver will call pharmacy for medication refills 3-7 days in advance of running out of medications -Patient/Caregiver will call provider office for new concerns or questions  -Patient/Caregiver will focus on medication adherence by taking medications as prescribed   -review your zone each day and know your atrial fibrillation (A-fib) Action Plan  - Continue to use you walker and cane when needed -Have self awareness at all times -Make sure to pick up your spironolactone 25 mg from the drugstore           SDOH assessments and interventions completed:  No     Care Coordination Interventions:  Yes, provided   Interventions Today    Flowsheet Row Most Recent Value  Chronic Disease   Chronic disease during today's visit Atrial Fibrillation (AFib)  General Interventions   General Interventions Discussed/Reviewed General Interventions Discussed   Nutrition Interventions   Nutrition Discussed/Reviewed Nutrition Discussed  Pharmacy Interventions   Pharmacy Dicussed/Reviewed Pharmacy Topics Discussed  [informed the patient that his prescription waas ready at the pharmacy.]  Safety Interventions   Safety Discussed/Reviewed Safety Discussed        Follow up plan: Follow up call scheduled for 10/11/22  9 am    Encounter Outcome:  Patient Visit Completed   Juanell Fairly RN, BSN, [facility]   Care Coordinator Phone: [phone]

## 2022-09-23 ENCOUNTER — Ambulatory Visit (INDEPENDENT_AMBULATORY_CARE_PROVIDER_SITE_OTHER): Payer: Medicare Other

## 2022-09-23 DIAGNOSIS — I428 Other cardiomyopathies: Secondary | ICD-10-CM

## 2022-09-23 LAB — CUP PACEART REMOTE DEVICE CHECK
Battery Remaining Longevity: 57 mo
Battery Remaining Percentage: 79 %
Battery Voltage: 2.98 V
Date Time Interrogation Session: 20240920040015
Implantable Lead Connection Status: 753985
Implantable Lead Connection Status: 753985
Implantable Lead Connection Status: 753985
Implantable Lead Implant Date: 20030319
Implantable Lead Implant Date: 20030319
Implantable Lead Implant Date: 20110211
Implantable Lead Location: 753858
Implantable Lead Location: 753859
Implantable Lead Location: 753860
Implantable Lead Model: 158
Implantable Lead Model: 4087
Implantable Lead Serial Number: 113412
Implantable Lead Serial Number: 159867
Implantable Pulse Generator Implant Date: 20221223
Lead Channel Impedance Value: 400 Ohm
Lead Channel Impedance Value: 660 Ohm
Lead Channel Pacing Threshold Amplitude: 0.5 V
Lead Channel Pacing Threshold Amplitude: 1.875 V
Lead Channel Pacing Threshold Pulse Width: 0.5 ms
Lead Channel Pacing Threshold Pulse Width: 1 ms
Lead Channel Sensing Intrinsic Amplitude: 12 mV
Lead Channel Setting Pacing Amplitude: 2.5 V
Lead Channel Setting Pacing Amplitude: 2.5 V
Lead Channel Setting Pacing Pulse Width: 0.5 ms
Lead Channel Setting Pacing Pulse Width: 1 ms
Lead Channel Setting Sensing Sensitivity: 2 mV
Pulse Gen Model: 3222
Pulse Gen Serial Number: 3978258

## 2022-10-03 NOTE — Progress Notes (Signed)
Remote pacemaker transmission.   

## 2022-10-11 ENCOUNTER — Ambulatory Visit: Payer: Self-pay

## 2022-10-11 NOTE — Patient Instructions (Signed)
Visit Information  Thank you for taking time to visit with me today. Please don't hesitate to contact me if I can be of assistance to you.   Following are the goals we discussed today:   Goals Addressed             This Visit's Progress    I want to manage my Afib       Patient Goals/Self Care Activities: -Patient/Caregiver will take medications as prescribed   -Patient/Caregiver will attend all scheduled provider appointments -Patient/Caregiver will call pharmacy for medication refills 3-7 days in advance of running out of medications -Patient/Caregiver will call provider office for new concerns or questions  -Patient/Caregiver will focus on medication adherence by taking medications as prescribed   -review your zone each day and know your atrial fibrillation (A-fib) Action Plan  - Continue to use you walker and cane when needed -Have self awareness at all times -Try not to takes naps during the day to help with sleeping at night -Try not to drink water after 6 pm                Our next appointment is by telephone on 11/12/22 at 9 am  Please call the care guide team at [phone] if you need to cancel or reschedule your appointment.   If you are experiencing a Mental Health or Behavioral Health Crisis or need someone to talk to, please call 1-800-273-TALK (toll free, 24 hour hotline)  The patient verbalized understanding of instructions, educational materials, and care plan provided today.    Juanell Fairly RN, BSN, [facility] Phone: [phone]

## 2022-10-11 NOTE — Patient Outreach (Addendum)
Care Coordination   Follow Up Visit Note   10/11/2022 Name: Mark Nguyen MRN: [id number] DOB: 1931-08-19  Mark Nguyen is a 89 y.o. year old male who sees [name], MD for primary care. I spoke with  Mark Nguyen by phone today.  What matters to the patients health and wellness today?  Mark Nguyen states that he has been doing well. He says since the weather has changed, he's noticed that his joints have started to hurt, especially his shoulder and right knee. He has been feeling a few flutters in his heart. He said that it comes every once in a while.  He had a remote check on 09/23/22, and everything was normal. He has had to use his cane daily, and he uses his walker at night, especially if he has to get up and go to the restroom. His appetite has been excellent; he often sleeps during the day because he has to go to the bathroom,  because he doesn't sleep well. he is still taking his medicines as prescribed.    Goals Addressed             This Visit's Progress    I want to manage my Afib       Patient Goals/Self Care Activities: -Patient/Caregiver will take medications as prescribed   -Patient/Caregiver will attend all scheduled provider appointments -Patient/Caregiver will call pharmacy for medication refills 3-7 days in advance of running out of medications -Patient/Caregiver will call provider office for new concerns or questions  -Patient/Caregiver will focus on medication adherence by taking medications as prescribed   -review your zone each day and know your atrial fibrillation (A-fib) Action Plan  - Continue to use you walker and cane when needed -Have self awareness at all times -Try not to takes naps during the day to help with sleeping at night -Try not to drink water after 6 pm                SDOH assessments and interventions completed:  No     Care Coordination Interventions:  Yes, provided   Interventions Today    Flowsheet Row Most Recent  Value  Chronic Disease   Chronic disease during today's visit Atrial Fibrillation (AFib)  General Interventions   General Interventions Discussed/Reviewed General Interventions Discussed  Nutrition Interventions   Nutrition Discussed/Reviewed Nutrition Discussed  Pharmacy Interventions   Pharmacy Dicussed/Reviewed Pharmacy Topics Discussed  Safety Interventions   Safety Discussed/Reviewed Fall Risk, Safety Discussed        Follow up plan: Follow up call scheduled for 11/11/22  9 am    Encounter Outcome:  Patient Visit Completed   Mark Fairly RN, BSN, [facility]   Care Coordinator Phone: [phone]

## 2022-10-13 DIAGNOSIS — Z23 Encounter for immunization: Secondary | ICD-10-CM | POA: Diagnosis not present

## 2022-10-25 DIAGNOSIS — C61 Malignant neoplasm of prostate: Secondary | ICD-10-CM | POA: Diagnosis not present

## 2022-10-25 DIAGNOSIS — R351 Nocturia: Secondary | ICD-10-CM | POA: Diagnosis not present

## 2022-11-11 ENCOUNTER — Ambulatory Visit: Payer: Self-pay

## 2022-11-11 NOTE — Patient Instructions (Signed)
Visit Information  Thank you for taking time to visit with me today. Please don't hesitate to contact me if I can be of assistance to you.   Following are the goals we discussed today:   Goals Addressed             This Visit's Progress    I want to manage my Afib       Patient Goals/Self Care Activities: -Patient/Caregiver will take medications as prescribed   -Patient/Caregiver will attend all scheduled provider appointments -Patient/Caregiver will call pharmacy for medication refills 3-7 days in advance of running out of medications -Patient/Caregiver will call provider office for new concerns or questions  -Patient/Caregiver will focus on medication adherence by taking medications as prescribed   -review your zone each day and know your atrial fibrillation (A-fib) Action Plan  - Continue to use you walker and cane when needed -Have self awareness at all times -Try not to drink water after 6 pm -Be aware of medications that may make you unstable on your feet                Our next appointment is by telephone on 12/14/22 at 903  am  Please call the care guide team at [phone] if you need to cancel or reschedule your appointment.   If you are experiencing a Mental Health or Behavioral Health Crisis or need someone to talk to, please call 1-800-273-TALK (toll free, 24 hour hotline)  The patient verbalized understanding of instructions, educational materials, and care plan provided today.    Juanell Fairly RN, BSN, [facility]  [facility], [facility]  Care Coordinator Phone: [phone]

## 2022-11-11 NOTE — Patient Outreach (Signed)
  Care Coordination   Follow Up Visit Note   11/11/2022 Name: Mark Nguyen MRN: [id number] DOB: [birth year]  Mark Nguyen is a 89 y.o. year old male who sees Mark Fam, MD for primary care. I spoke with  Mark Nguyen by phone today.  What matters to the patients health and wellness today?  I spoke with [name] this morning, and he said he is feeling good. He is taking melatonin, which is helping him sleep. However, he has been experiencing a runny nose and watery eyes. He was taking 20 milligrams of Benadryl, but when he ran out, he took 40 milligrams, which made him feel dizzy, and he almost fell. He had gotten up to change his thermostat and accidentally fell into the wall, so he decided to stop taking the 40 milligrams of Benadryl. He has had no issues with his AFib, he has already received his flu shot, and his appetite is normal. He is taking his medications as prescribed.    Goals Addressed             This Visit's Progress    I want to manage my Afib       Patient Goals/Self Care Activities: -Patient/Caregiver will take medications as prescribed   -Patient/Caregiver will attend all scheduled provider appointments -Patient/Caregiver will call pharmacy for medication refills 3-7 days in advance of running out of medications -Patient/Caregiver will call provider office for new concerns or questions  -Patient/Caregiver will focus on medication adherence by taking medications as prescribed   -review your zone each day and know your atrial fibrillation (A-fib) Action Plan  - Continue to use you walker and cane when needed -Have self awareness at all times -Try not to drink water after 6 pm -Be aware of medications that may make you unstable on your feet                SDOH assessments and interventions completed:  No     Care Coordination Interventions:  Yes, provided   Interventions Today    Flowsheet Row Most Recent Value  Chronic Disease   Chronic  disease during today's visit Atrial Fibrillation (AFib)  General Interventions   General Interventions Discussed/Reviewed General Interventions Discussed  Nutrition Interventions   Nutrition Discussed/Reviewed Nutrition Discussed  Pharmacy Interventions   Pharmacy Dicussed/Reviewed Pharmacy Topics Discussed  Safety Interventions   Safety Discussed/Reviewed Fall Risk, Safety Discussed        Follow up plan: Follow up call scheduled for 12/14/22  930 am    Encounter Outcome:  Patient Visit Completed   Juanell Fairly RN, BSN, [facility] [place]  [facility], [facility]  Care Coordinator Phone: [phone]

## 2022-12-14 ENCOUNTER — Ambulatory Visit: Payer: Self-pay

## 2022-12-14 NOTE — Patient Outreach (Signed)
  Care Coordination   Follow Up Visit Note   12/14/2022 Name: Mark Nguyen MRN: [id number] DOB: 1931/06/16  Mark Nguyen is a 89 y.o. year old male who sees Mark Fam, MD for primary care. I spoke with  Mark Nguyen by phone today.  What matters to the patients health and wellness today?  Mark Nguyen is currently in stable condition. He has not experienced any falls; however, he reports feeling slightly off balance upon waking in the morning. To mitigate this issue, he prefers to sit on the edge of the bed until he regains his composure. He is determined to avoid any additional falls. His nutritional intake is satisfactory, and he has observed an improvement in his sleep patterns since beginning to take Benadryl. Also, he utilizes Tylenol to manage his joint pain. He adheres strictly to his prescribed medication regimen for his atrial fibrillation (AFib). While he occasionally experiences a mild fluttering sensation, he does not report any other health concerns.        Goals Addressed             This Visit's Progress    I want to manage my Afib       Patient Goals/Self Care Activities: -Patient/Caregiver will take medications as prescribed   -Patient/Caregiver will attend all scheduled provider appointments -Patient/Caregiver will call pharmacy for medication refills 3-7 days in advance of running out of medications -Patient/Caregiver will call provider office for new concerns or questions  -Patient/Caregiver will focus on medication adherence by taking medications as prescribed   -review your zone each day and know your atrial fibrillation (A-fib) Action Plan  - Continue to use you walker and cane when needed -Have self awareness at all times -Try not to drink water after 6 pm -Be aware of medications that may make you unstable on your feet -Continue when awaking sit on the side of the bed to get your bearings                SDOH assessments and interventions  completed:  No     Care Coordination Interventions:  Yes, provided   Interventions Today    Flowsheet Row Most Recent Value  Chronic Disease   Chronic disease during today's visit Atrial Fibrillation (AFib)  General Interventions   General Interventions Discussed/Reviewed General Interventions Discussed, General Interventions Reviewed  Pharmacy Interventions   Pharmacy Dicussed/Reviewed Pharmacy Topics Discussed  Safety Interventions   Safety Discussed/Reviewed Safety Discussed, Fall Risk        Follow up plan: Follow up call scheduled for 02/01/23  9 am    Encounter Outcome:  Patient Visit Completed   Mark Fairly RN, BSN, [facility]  [facility], [facility]  Care Coordinator Phone: [phone]

## 2022-12-14 NOTE — Patient Instructions (Addendum)
Visit Information  Thank you for taking time to visit with me today. Please don't hesitate to contact me if I can be of assistance to you.   Following are the goals we discussed today:   Goals Addressed             This Visit's Progress    I want to manage my Afib       Patient Goals/Self Care Activities: -Patient/Caregiver will take medications as prescribed   -Patient/Caregiver will attend all scheduled provider appointments -Patient/Caregiver will call pharmacy for medication refills 3-7 days in advance of running out of medications -Patient/Caregiver will call provider office for new concerns or questions  -Patient/Caregiver will focus on medication adherence by taking medications as prescribed   -review your zone each day and know your atrial fibrillation (A-fib) Action Plan  - Continue to use you walker and cane when needed -Have self awareness at all times -Try not to drink water after 6 pm -Be aware of medications that may make you unstable on your feet -Continue when awaking sit on the side of the bed to get your bearings                Our next appointment is by telephone on 02/01/23 at 9 am  Please call the care guide team at [phone] if you need to cancel or reschedule your appointment.   If you are experiencing a Mental Health or Behavioral Health Crisis or need someone to talk to, please call 1-800-273-TALK (toll free, 24 hour hotline)  The patient verbalized understanding of instructions, educational materials, and care plan provided today and agreed to receive a mailed copy of patient instructions, educational materials, and care plan.   Juanell Fairly RN, BSN, [facility]  [facility], [facility]  Care Coordinator Phone: [phone]

## 2022-12-22 ENCOUNTER — Other Ambulatory Visit: Payer: Self-pay

## 2022-12-22 DIAGNOSIS — I4819 Other persistent atrial fibrillation: Secondary | ICD-10-CM

## 2022-12-22 MED ORDER — APIXABAN 5 MG PO TABS
5.0000 mg | ORAL_TABLET | Freq: Two times a day (BID) | ORAL | 0 refills | Status: DC
Start: 2022-12-22 — End: 2022-12-26

## 2022-12-22 NOTE — Telephone Encounter (Addendum)
Prescription refill request for Eliquis received. Indication: Afib  Last office visit: 06/17/22 Ladona Ridgel)  Scr: 0.94 (06/28/21)  Age: 89 Weight: 79.4kg  Labs overdue. Called pt and made him aware. Pt will go to [facility] office tomorrow to have updated labs drawn. CBC & BMET ordered.

## 2022-12-23 ENCOUNTER — Ambulatory Visit (INDEPENDENT_AMBULATORY_CARE_PROVIDER_SITE_OTHER): Payer: Medicare Other

## 2022-12-23 DIAGNOSIS — I428 Other cardiomyopathies: Secondary | ICD-10-CM | POA: Diagnosis not present

## 2022-12-23 DIAGNOSIS — I4819 Other persistent atrial fibrillation: Secondary | ICD-10-CM | POA: Diagnosis not present

## 2022-12-24 LAB — BASIC METABOLIC PANEL
BUN/Creatinine Ratio: 16 (ref 10–24)
BUN: 17 mg/dL (ref 8–27)
CO2: 21 mmol/L (ref 20–29)
Calcium: 8.9 mg/dL (ref 8.6–10.2)
Chloride: 107 mmol/L — ABNORMAL HIGH (ref 96–106)
Creatinine, Ser: 1.08 mg/dL (ref 0.76–1.27)
Glucose: 101 mg/dL — ABNORMAL HIGH (ref 70–99)
Potassium: 4.3 mmol/L (ref 3.5–5.2)
Sodium: 144 mmol/L (ref 134–144)
eGFR: 66 mL/min/{1.73_m2} (ref >59)

## 2022-12-26 ENCOUNTER — Other Ambulatory Visit: Payer: Self-pay | Admitting: *Deleted

## 2022-12-26 DIAGNOSIS — I4819 Other persistent atrial fibrillation: Secondary | ICD-10-CM

## 2022-12-26 LAB — CUP PACEART REMOTE DEVICE CHECK
Battery Remaining Longevity: 53 mo
Battery Remaining Percentage: 75 %
Battery Voltage: 2.98 V
Date Time Interrogation Session: 20241220193837
Implantable Lead Connection Status: 753985
Implantable Lead Connection Status: 753985
Implantable Lead Connection Status: 753985
Implantable Lead Implant Date: 20030319
Implantable Lead Implant Date: 20030319
Implantable Lead Implant Date: 20110211
Implantable Lead Location: 753858
Implantable Lead Location: 753859
Implantable Lead Location: 753860
Implantable Lead Model: 158
Implantable Lead Model: 4087
Implantable Lead Serial Number: 113412
Implantable Lead Serial Number: 159867
Implantable Pulse Generator Implant Date: 20221223
Lead Channel Impedance Value: 390 Ohm
Lead Channel Impedance Value: 610 Ohm
Lead Channel Pacing Threshold Amplitude: 0.5 V
Lead Channel Pacing Threshold Amplitude: 1.75 V
Lead Channel Pacing Threshold Pulse Width: 0.5 ms
Lead Channel Pacing Threshold Pulse Width: 1 ms
Lead Channel Sensing Intrinsic Amplitude: 12 mV
Lead Channel Setting Pacing Amplitude: 2.5 V
Lead Channel Setting Pacing Amplitude: 2.5 V
Lead Channel Setting Pacing Pulse Width: 0.5 ms
Lead Channel Setting Pacing Pulse Width: 1 ms
Lead Channel Setting Sensing Sensitivity: 2 mV
Pulse Gen Model: 3222
Pulse Gen Serial Number: 3978258

## 2022-12-26 MED ORDER — APIXABAN 5 MG PO TABS
5.0000 mg | ORAL_TABLET | Freq: Two times a day (BID) | ORAL | 1 refills | Status: DC
Start: 2022-12-26 — End: 2023-05-30

## 2022-12-26 NOTE — Telephone Encounter (Signed)
Eliquis 5mg  refill request received. Patient is 89 years old, weight-79.4kg, Crea-1.08 on 12/23/22, Diagnosis-Afib, and last seen by Dr. Ladona Ridgel on 06/17/22. Dose is appropriate based on dosing criteria. Will send in refill to requested pharmacy.

## 2023-01-23 ENCOUNTER — Telehealth: Payer: Self-pay | Admitting: Internal Medicine

## 2023-01-23 NOTE — Telephone Encounter (Signed)
Pt c/o medication issue:  1. Name of Medication: apixaban (ELIQUIS) 5 MG TABS tablet   2. How are you currently taking this medication (dosage and times per day)?   3. Are you having a reaction (difficulty breathing--STAT)?   4. What is your medication issue? Patient is requesting call back in regards to this medication and price. States he can not afford the amount they are charging. Would like a call back to discuss options.

## 2023-01-24 ENCOUNTER — Other Ambulatory Visit (HOSPITAL_COMMUNITY): Payer: Self-pay

## 2023-01-24 NOTE — Telephone Encounter (Signed)
Based on test claims patient has a high deductible Medicare plan and would likely benefit from setting up SHIP payments. Patient can contact the customer service number on the back of their insurance card to request SHIP assistance where the copayment's would be spilt up into payments over time. Unfortunately all the PAP options for DOAC's require patient to meet a 3-4% out of pocket spending on drugs for 2025 based on their annual income. Patient's likely won't meet any PAP requirements until later on in the year once they have paid some towards their deductible. I hope this makes sense. Please reach back out if you have any additional questions or concerns.

## 2023-01-24 NOTE — Progress Notes (Signed)
Remote pacemaker transmission.   

## 2023-01-24 NOTE — Addendum Note (Signed)
Addended by: Elease Etienne A on: 01/24/2023 10:17 AM   Modules accepted: Orders

## 2023-02-28 DIAGNOSIS — C61 Malignant neoplasm of prostate: Secondary | ICD-10-CM | POA: Diagnosis not present

## 2023-02-28 DIAGNOSIS — R351 Nocturia: Secondary | ICD-10-CM | POA: Diagnosis not present

## 2023-03-02 ENCOUNTER — Ambulatory Visit: Payer: Self-pay

## 2023-03-02 ENCOUNTER — Other Ambulatory Visit (HOSPITAL_COMMUNITY): Payer: Self-pay

## 2023-03-02 NOTE — Telephone Encounter (Signed)
 BMS application has been mailed to patient

## 2023-03-02 NOTE — Patient Instructions (Signed)
 Visit Information  Thank you for taking time to visit with me today. Please don't hesitate to contact me if I can be of assistance to you.   Following are the goals we discussed today:   Goals Addressed             This Visit's Progress    I want to manage my Afib       Patient Goals/Self Care Activities: -Patient/Caregiver will take medications as prescribed   -Patient/Caregiver will attend all scheduled provider appointments -Patient/Caregiver will call pharmacy for medication refills 3-7 days in advance of running out of medications -Patient/Caregiver will call provider office for new concerns or questions  -Patient/Caregiver will focus on medication adherence by taking medications as prescribed   -review your zone each day and know your atrial fibrillation (A-fib) Action Plan  - Continue to use you walker and cane when needed -Have self awareness at all times -Try not to drink water after 6 pm -Be aware of medications that may make you unstable on your feet -Continue when awaking sit on the side of the bed to get your bearings -look for a call from the pharmacy tech to call you about help with medication assistance.                Our next appointment is by telephone on 03/30/23 at 9 am  Please call the care guide team at [phone] if you need to cancel or reschedule your appointment.   If you are experiencing a Mental Health or Behavioral Health Crisis or need someone to talk to, please call 1-800-273-TALK (toll free, 24 hour hotline)  The patient verbalized understanding of instructions, educational materials.   Juanell Fairly RN, BSN, [facility]  [facility], [facility]  Care Coordinator Phone: [phone]

## 2023-03-02 NOTE — Patient Outreach (Signed)
 Care Coordination   Follow Up Visit Note   03/02/2023 Name: Mark Nguyen MRN: [id number] DOB: 1931-06-13  ARSEN MANGIONE is a 89 y.o. year old male who sees [name], MD for primary care. I spoke with  [name] by phone today.  What matters to the patients health and wellness today?  I had a conversation with Mr. Delaguila today, during which he reported significant improvement in his health following recovery from a recent cold. He continues to utilize a walker due to instability while walking, a measure I commended as an effective strategy to prevent falls.  Mr. Castilla disclosed that he had lost weight, decreasing from 178 pounds to 168 pounds. He maintains a nutritious breakfast and lunch but experiences a reduced appetite during dinner. I recommended that he consider supplemental nutritional drinks to enhance his dietary intake.  Additionally, Mr. Blatz expressed concerns regarding the cost of his Eliquis prescription, which he takes at 5 mg twice daily. The expense for his initial prescription this year was $584.93, while this month's cost was $116.16. He noted that, given the rising costs of his medication and heating bills, these expenses are becoming increasingly difficult to manage. I assured him I would consult [facility] to explore possible assistance options. Durward Mallard will send her co-worker in the cardiology office a message to contact him for help.    Goals Addressed             This Visit's Progress    I want to manage my Afib       Patient Goals/Self Care Activities: -Patient/Caregiver will take medications as prescribed   -Patient/Caregiver will attend all scheduled provider appointments -Patient/Caregiver will call pharmacy for medication refills 3-7 days in advance of running out of medications -Patient/Caregiver will call provider office for new concerns or questions  -Patient/Caregiver will focus on medication adherence by taking medications as  prescribed   -review your zone each day and know your atrial fibrillation (A-fib) Action Plan  - Continue to use you walker and cane when needed -Have self awareness at all times -Try not to drink water after 6 pm -Be aware of medications that may make you unstable on your feet -Continue when awaking sit on the side of the bed to get your bearings -look for a call from the pharmacy tech to call you about help with medication assistance.                SDOH assessments and interventions completed:  No     Care Coordination Interventions:  Yes, provided   Interventions Today    Flowsheet Row Most Recent Value  Chronic Disease   Chronic disease during today's visit Atrial Fibrillation (AFib)  General Interventions   General Interventions Discussed/Reviewed General Interventions Discussed, General Interventions Reviewed, Communication with  Communication with Pharmacists  [Tech to help with medication assistance]  Education Interventions   Education Provided Provided Education  Nutrition Interventions   Nutrition Discussed/Reviewed Nutrition Discussed, Supplemental nutrition  Pharmacy Interventions   Pharmacy Dicussed/Reviewed Pharmacy Topics Discussed  Safety Interventions   Safety Discussed/Reviewed Safety Discussed, Fall Risk        Follow up plan: Follow up call scheduled for 03/30/23  9 am    Encounter Outcome:  Patient Visit Completed   Juanell Fairly RN, BSN, [facility] [place]  [facility], [facility]  Care Coordinator Phone: [phone]

## 2023-03-02 NOTE — Telephone Encounter (Signed)
 Received call from Juanell Fairly [facility] manager).   Patient still struggling to pay for Eliquis. He paid $500+ in January, and $100+ this month.   Patient "may" qualify for PAP now with [employer] (of course depending on his income).   He never rec'd the info about the insurance copsy option. He could also try for Low Income Subsidy with social security in the meantime as well.

## 2023-03-22 ENCOUNTER — Other Ambulatory Visit: Payer: Self-pay | Admitting: Internal Medicine

## 2023-03-22 DIAGNOSIS — I5022 Chronic systolic (congestive) heart failure: Secondary | ICD-10-CM

## 2023-03-24 ENCOUNTER — Ambulatory Visit: Payer: Medicare Other

## 2023-03-24 DIAGNOSIS — I428 Other cardiomyopathies: Secondary | ICD-10-CM | POA: Diagnosis not present

## 2023-03-24 LAB — CUP PACEART REMOTE DEVICE CHECK
Battery Remaining Longevity: 52 mo
Battery Remaining Percentage: 72 %
Battery Voltage: 2.98 V
Date Time Interrogation Session: 20250321020013
Implantable Lead Connection Status: 753985
Implantable Lead Connection Status: 753985
Implantable Lead Connection Status: 753985
Implantable Lead Implant Date: 20030319
Implantable Lead Implant Date: 20030319
Implantable Lead Implant Date: 20110211
Implantable Lead Location: 753858
Implantable Lead Location: 753859
Implantable Lead Location: 753860
Implantable Lead Model: 158
Implantable Lead Model: 4087
Implantable Lead Serial Number: 113412
Implantable Lead Serial Number: 159867
Implantable Pulse Generator Implant Date: 20221223
Lead Channel Impedance Value: 430 Ohm
Lead Channel Impedance Value: 610 Ohm
Lead Channel Pacing Threshold Amplitude: 0.5 V
Lead Channel Pacing Threshold Amplitude: 2.125 V
Lead Channel Pacing Threshold Pulse Width: 0.5 ms
Lead Channel Pacing Threshold Pulse Width: 1 ms
Lead Channel Sensing Intrinsic Amplitude: 12 mV
Lead Channel Setting Pacing Amplitude: 2.5 V
Lead Channel Setting Pacing Amplitude: 2.5 V
Lead Channel Setting Pacing Pulse Width: 0.5 ms
Lead Channel Setting Pacing Pulse Width: 1 ms
Lead Channel Setting Sensing Sensitivity: 2 mV
Pulse Gen Model: 3222
Pulse Gen Serial Number: 3978258

## 2023-03-30 ENCOUNTER — Ambulatory Visit: Payer: Self-pay

## 2023-03-30 NOTE — Patient Outreach (Signed)
 Care Coordination   Follow Up Visit Note   03/30/2023 Name: Mark Nguyen MRN: [id number] DOB: 06-09-1931  Mark Nguyen is a 89 y.o. year old male who sees Mark Fam, MD for primary care. I spoke with  Mark Nguyen by phone today.  What matters to the patients health and wellness today?  I recently spoke with Mark Nguyen, who conveyed that he has been managing Nguyen well. He has not experienced any significant breathing issues; however, he occasionally feels a slight dizziness upon standing. We discussed about when he stand he needs to get his bearing before moving to prevents falls. Additionally, he mentioned experiencing occasional flutters and indicated that he is unable to stand for extended periods due to discomfort in his knees. Mark Nguyen continues to be monitored with his machine. Furthermore, he expressed a need for assistance regarding his Eliquis medication. He needs help completing the application for medication assistance but reported that he had been contacted by a representative who was supposed to provide a follow-up call, which he has yet to receive. Consequently, I will initiate a communication with the medication assistance team to request a callback for Mark Nguyen.    Goals Addressed             This Visit's Progress    I want to manage my Afib       Patient Goals/Self Care Activities: -Patient/Caregiver will take medications as prescribed   -Patient/Caregiver will attend all scheduled provider appointments -Patient/Caregiver will call pharmacy for medication refills 3-7 days in advance of running out of medications -Patient/Caregiver will call provider office for new concerns or questions  -Patient/Caregiver will focus on medication adherence by taking medications as prescribed   -review your zone each day and know your atrial fibrillation (A-fib) Action Plan  - Continue to use you walker and cane when needed -Have self awareness at all times -Try not to drink  water after 6 pm -Be aware of medications that may make you unstable on your feet -Continue when awaking sit on the side of the bed to get your bearings -look for a call from the pharmacy tech to call you about help with medication assistance.    Atrial Fibrillation (Afib) Definition: A fast and irregular heart rhythm that can be occasional, persistent, or become permanent and requires treatment. Atrial fibrillation, sometimes called "Afib", can lead to other medical conditions such as high blood pressure, stroke, heart attack, heart failure, or other health conditions. Most people are unaware they have this condition until it is found during a physical examination or EKG (test that checks electrical activity of the heart).    Some Atrial Fibrillation signs/symptoms:  A racing or irregular heartbeat that may cause an uncomfortable "flip flopping" feeling in the chest,Weakness or feeling unusually tired,Feeling faint,Dizziness,Shortness of breath with or without chest pain  Things you can do: Take the medications prescribed to control your heart rate and rhythm and reduce your risk of blood clot formation (anticoagulants or antiplatelet medications/"blood thinners"). Exercise and maintain an ideal body weight, as prescribed by your physician. Avoid smoking, alcohol, and illicit drug use.   Learn to check your pulse and write down the number every day.  Record your zone each day and know your atrial fibrillation (A-fib) Action Plan   A-Fib ACTION PLAN Actions to Take if My Symptoms Get Worse   WHAT ZONE ARE YOU IN TODAY? Green, Yellow, or Red?    GREEN ZONE: This is your goal!  No  shortness of breath.  No weakness.  No dizziness. No continuous racing of the heart.   YELLOW ZONE: Call your doctor TODAY to get help!  You may have one or more of the following:  Racing or irregular heart beat that my be uncomfortable. Shortness of breath with or without chest pain.  Weakness (unusually  tired or feeling faint).  Dizziness.    RED ZONE: EMERGENCY CALL 911!  Increased chest pain that is continuous.  Fainting or near fainting.  Struggling to breathe.  New confusion or can't think clearly.  Symptoms of Stroke: F - Face Drooping                                         A - Arms drift downward when both arms are raised                                         S - Slurred Speech                                         T - Time to call 911 immediately.                    SDOH assessments and interventions completed:  Yes  SDOH Interventions Today    Flowsheet Row Most Recent Value  SDOH Interventions   Food Insecurity Interventions Intervention Not Indicated        Care Coordination Interventions:  Yes, provided   Interventions Today    Flowsheet Row Most Recent Value  Chronic Disease   Chronic disease during today's visit Atrial Fibrillation (AFib)  General Interventions   General Interventions Discussed/Reviewed General Interventions Discussed, Communication with  Communication with Pharmacists  Education Interventions   Education Provided Provided Education  Pharmacy Interventions   Pharmacy Dicussed/Reviewed Pharmacy Topics Discussed, Pharmacy Topics Reviewed  Safety Interventions   Safety Discussed/Reviewed Safety Discussed, Fall Risk        Follow up plan: Follow up call scheduled for 05/02/23  930 am    Encounter Outcome:  Patient Visit Completed   Mark Fairly RN, BSN, [facility]  [facility], [facility]  Care Coordinator Phone: [phone]

## 2023-03-30 NOTE — Patient Instructions (Signed)
 Visit Information  Thank you for taking time to visit with me today. Please don't hesitate to contact me if I can be of assistance to you.   Following are the goals we discussed today:   Goals Addressed             This Visit's Progress    I want to manage my Afib       Patient Goals/Self Care Activities: -Patient/Caregiver will take medications as prescribed   -Patient/Caregiver will attend all scheduled provider appointments -Patient/Caregiver will call pharmacy for medication refills 3-7 days in advance of running out of medications -Patient/Caregiver will call provider office for new concerns or questions  -Patient/Caregiver will focus on medication adherence by taking medications as prescribed   -review your zone each day and know your atrial fibrillation (A-fib) Action Plan  - Continue to use you walker and cane when needed -Have self awareness at all times -Try not to drink water after 6 pm -Be aware of medications that may make you unstable on your feet -Continue when awaking sit on the side of the bed to get your bearings -look for a call from the pharmacy tech to call you about help with medication assistance.    Atrial Fibrillation (Afib) Definition: A fast and irregular heart rhythm that can be occasional, persistent, or become permanent and requires treatment. Atrial fibrillation, sometimes called "Afib", can lead to other medical conditions such as high blood pressure, stroke, heart attack, heart failure, or other health conditions. Most people are unaware they have this condition until it is found during a physical examination or EKG (test that checks electrical activity of the heart).    Some Atrial Fibrillation signs/symptoms:  A racing or irregular heartbeat that may cause an uncomfortable "flip flopping" feeling in the chest,Weakness or feeling unusually tired,Feeling faint,Dizziness,Shortness of breath with or without chest pain  Things you can do: Take the  medications prescribed to control your heart rate and rhythm and reduce your risk of blood clot formation (anticoagulants or antiplatelet medications/"blood thinners"). Exercise and maintain an ideal body weight, as prescribed by your physician. Avoid smoking, alcohol, and illicit drug use.   Learn to check your pulse and write down the number every day.  Record your zone each day and know your atrial fibrillation (A-fib) Action Plan   A-Fib ACTION PLAN Actions to Take if My Symptoms Get Worse   WHAT ZONE ARE YOU IN TODAY? Green, Yellow, or Red?    GREEN ZONE: This is your goal!  No shortness of breath.  No weakness.  No dizziness. No continuous racing of the heart.   YELLOW ZONE: Call your doctor TODAY to get help!  You may have one or more of the following:  Racing or irregular heart beat that my be uncomfortable. Shortness of breath with or without chest pain.  Weakness (unusually tired or feeling faint).  Dizziness.    RED ZONE: EMERGENCY CALL 911!  Increased chest pain that is continuous.  Fainting or near fainting.  Struggling to breathe.  New confusion or can't think clearly.  Symptoms of Stroke: F - Face Drooping                                         A - Arms drift downward when both arms are raised  S - Slurred Speech                                         T - Time to call 911 immediately.                    Our next appointment is by telephone on 05/02/23 at 930 am  Please call the care guide team at [phone] if you need to cancel or reschedule your appointment.   If you are experiencing a Mental Health or Behavioral Health Crisis or need someone to talk to, please call 1-800-273-TALK (toll free, 24 hour hotline)  The patient verbalized understanding of instructions, educational materials, and care plan provided today and DECLINED offer to receive copy of patient instructions, educational materials, and care  plan.   Juanell Fairly RN, BSN, [facility] [place]  [facility], [facility]  Care Coordinator Phone: [phone]

## 2023-03-31 ENCOUNTER — Telehealth: Payer: Self-pay | Admitting: Pharmacy Technician

## 2023-03-31 NOTE — Telephone Encounter (Signed)
 PAP: Patient assistance application for Eliquis through [employer] ([employer]) has been mailed to pt's home address on file. Provider portion of application will be faxed to provider's office.

## 2023-04-07 NOTE — Telephone Encounter (Signed)
 Hi, I have scanned in media the patient portion for [employer] under "ELIQUIS BMS APPLICATION" if someone can get the provider to sign for me please and send back to [phone] thank you!

## 2023-04-07 NOTE — Telephone Encounter (Signed)
 Form printed and signed by DOD (Dr. Lynnette Caffey).  Scanned back to med assistance team.

## 2023-04-10 NOTE — Telephone Encounter (Signed)
 PAP: Application for Eliquis has been submitted to [employer] ([employer]), via fax

## 2023-04-17 ENCOUNTER — Other Ambulatory Visit (HOSPITAL_COMMUNITY): Payer: Self-pay

## 2023-04-17 NOTE — Telephone Encounter (Signed)
 Faxed insurance card to [employer]

## 2023-04-17 NOTE — Telephone Encounter (Signed)
 I called [employer] and they said they need a copy of the front and back of his insurance card. I called the patient and he told me to talk to Conroy. I called Sandy at [phone] and she said she will get with the patient and send me the info.

## 2023-04-19 NOTE — Telephone Encounter (Addendum)
   I called sandy to notify her . I also called [employer] and they said it would be around July before he would be approved based on OOP expenses

## 2023-05-02 ENCOUNTER — Ambulatory Visit: Payer: Self-pay

## 2023-05-02 NOTE — Progress Notes (Signed)
 Remote pacemaker transmission.

## 2023-05-02 NOTE — Patient Outreach (Signed)
 Complex Care Management   Visit Note  05/02/2023  Name:  Mark Nguyen MRN: [id number] DOB: 1931-12-06  Situation: Referral received for Complex Care Management related to Atrial Fibrillation I obtained verbal consent from Patient.  Visit completed with patient  on the phone  Background:   Past Medical History:  Diagnosis Date   CAD (coronary artery disease)    CHF (congestive heart failure) (HCC)    Dyslipidemia    History of ventricular fibrillation    HTN (hypertension)    Hx-sudden cardiac arrest    Ischemic cardiomyopathy    LBBB (left bundle branch block)    Ventricular tachycardia (HCC)     Assessment: Patient Reported Symptoms:  Cognitive Cognitive Status: Able to follow simple commands, Alert and oriented to person, place, and time, Insightful and able to interpret abstract concepts      Neurological Neurological Review of Symptoms: No symptoms reported    HEENT HEENT Symptoms Reported: No symptoms reported      Cardiovascular Cardiovascular Symptoms Reported: Other: Other Cardiovascular Symptoms: Flutters Cardiovascular Conditions:  (AFIB) Cardiovascular Management Strategies: Medication therapy Cardiovascular Self-Management Outcome: 4 (good)  Respiratory Respiratory Symptoms Reported: No symptoms reported    Endocrine Patient reports the following symptoms related to hypoglycemia or hyperglycemia : No symptoms reported    Gastrointestinal Gastrointestinal Symptoms Reported: No symptoms reported      Genitourinary Genitourinary Symptoms Reported: Urgency Genitourinary Conditions: Frequency  Integumentary Integumentary Symptoms Reported: No symptoms reported    Musculoskeletal Musculoskelatal Symptoms Reviewed: Unsteady gait Musculoskeletal Conditions: Joint pain Musculoskeletal Self-Management Outcome: 4 (good) Falls in the past year?: No    Psychosocial Psychosocial Symptoms Reported: No symptoms reported            05/02/2023    9:38 AM   Depression screen PHQ 2/9  Decreased Interest 0  Down, Depressed, Hopeless 0  PHQ - 2 Score 0    There were no vitals filed for this visit.  Medications Reviewed Today     Reviewed by Augustin Leber, RN (Registered Nurse) on 05/02/23 at [phone]  Med List Status: <None>   Medication Order Taking? Sig Documenting Provider Last Dose Status Informant  apixaban  (ELIQUIS ) 5 MG TABS tablet [id number]  Take 1 tablet (5 mg total) by mouth 2 (two) times daily. Tammie Fall, MD  Active   atorvastatin  (LIPITOR) 20 MG tablet [id number]  Take 1 tablet (20 mg total) by mouth at bedtime. Tammie Fall, MD  Active   carvedilol  (COREG ) 25 MG tablet [id number]  TAKE 1 TABLET BY MOUTH TWICE DAILY WITH A MEAL Tammie Fall, MD  Active   Cholecalciferol  (VITAMIN D -3) 1000 UNITS CAPS [id number] Yes Take 1,000 Units by mouth daily.  [provider] Taking Active Self  furosemide  (LASIX ) 40 MG tablet [id number] Yes TAKE 1 TABLET BY MOUTH DAILY. HOLD DOSAGE IF WEIGHT < 183 POUNDS Tammie Fall, MD Taking Active   isosorbide  mononitrate (IMDUR ) 30 MG 24 hr tablet [id number] Yes Take 1 tablet (30 mg total) by mouth daily. Tammie Fall, MD Taking Active   losartan  (COZAAR ) 25 MG tablet [id number] Yes TAKE 1/2 TABLET BY MOUTH AT BEDTIME. Tammie Fall, MD Taking Active   omeprazole  (PRILOSEC) 20 MG capsule [id number] Yes TAKE 1 CAPSULE BY MOUTH ONCE DAILY. Tammie Fall, MD Taking Active   phenylephrine  (SUDAFED PE) 10 MG TABS tablet [id number] Yes Take 10 mg by mouth every 6 (six) hours as needed (congestion). [provider] Taking Active Self  spironolactone  (ALDACTONE )  25 MG tablet [id number] Yes Take 0.5 tablets (12.5 mg total) by mouth daily. Tammie Fall, MD Taking Active   Tamsulosin  HCl (FLOMAX ) 0.4 MG CAPS [id number] Yes Take 0.4 mg by mouth daily after breakfast.  [provider] Taking Active Self            Recommendation:   PCP Follow-up  Follow Up Plan:   Telephone  follow up appointment with care management team member scheduled for:  06/02/23  9 am   Augustin Leber RN, BSN, [facility]  [facility], [facility]  Care Coordinator Phone: [phone]

## 2023-05-02 NOTE — Patient Instructions (Signed)
 Visit Information  Thank you for taking time to visit with me today. Please don't hesitate to contact me if I can be of assistance to you before our next scheduled appointment.  Your next care management appointment is by telephone on 06/02/23 at 9 am   Please call the care guide team at [phone] if you need to cancel, schedule, or reschedule an appointment.   Please call 1-800-273-TALK (toll free, 24 hour hotline) if you are experiencing a Mental Health or Behavioral Health Crisis or need someone to talk to.  Augustin Leber RN, BSN, [facility]  [facility], [facility]  Care Coordinator Phone: [phone]

## 2023-05-30 ENCOUNTER — Other Ambulatory Visit: Payer: Self-pay

## 2023-05-30 DIAGNOSIS — I4819 Other persistent atrial fibrillation: Secondary | ICD-10-CM

## 2023-05-30 MED ORDER — APIXABAN 5 MG PO TABS: 5.0000 mg | ORAL_TABLET | Freq: Two times a day (BID) | ORAL | 1 refills | Status: DC

## 2023-05-30 NOTE — Telephone Encounter (Signed)
 Pt last saw Dr Carolynne Citron 06/17/22, last labs 12/23/22 Creat 1.08, age 89, weight 79.4kg, based on specified criteria pt is on appropriate dosage of Eliquis  5mg  BID for afib.  Will refill rx.

## 2023-05-31 ENCOUNTER — Other Ambulatory Visit: Payer: Self-pay

## 2023-05-31 DIAGNOSIS — I5022 Chronic systolic (congestive) heart failure: Secondary | ICD-10-CM

## 2023-05-31 MED ORDER — SPIRONOLACTONE 25 MG PO TABS: 12.5000 mg | ORAL_TABLET | Freq: Every day | ORAL | 0 refills | Status: DC

## 2023-06-02 ENCOUNTER — Other Ambulatory Visit: Payer: Self-pay

## 2023-06-02 NOTE — Patient Outreach (Signed)
 Complex Care Management   Visit Note  06/02/2023  Name:  Mark Nguyen MRN: [id number] DOB: 05/03/1931  Situation: Referral received for Complex Care Management related to Atrial Fibrillation I obtained verbal consent from Patient.  Visit completed with patient  on the phone  Background:   Past Medical History:  Diagnosis Date   CAD (coronary artery disease)    CHF (congestive heart failure) (HCC)    Dyslipidemia    History of ventricular fibrillation    HTN (hypertension)    Hx-sudden cardiac arrest    Ischemic cardiomyopathy    LBBB (left bundle branch block)    Ventricular tachycardia (HCC)     Assessment: Patient Reported Symptoms:  Cognitive Cognitive Status: Able to follow simple commands, Alert and oriented to person, place, and time, Normal speech and language skills      Neurological Neurological Review of Symptoms: No symptoms reported    HEENT HEENT Symptoms Reported: No symptoms reported      Cardiovascular Cardiovascular Symptoms Reported: Other: Other Cardiovascular Symptoms: Flutters Cardiovascular Conditions:  (Afib) Cardiovascular Self-Management Outcome: 4 (good)  Respiratory Respiratory Symptoms Reported: No symptoms reported    Endocrine Patient reports the following symptoms related to hypoglycemia or hyperglycemia : No symptoms reported    Gastrointestinal Gastrointestinal Symptoms Reported: No symptoms reported      Genitourinary Genitourinary Symptoms Reported: Frequency Genitourinary Conditions: Frequency Genitourinary Management Strategies: Medication therapy  Integumentary Integumentary Symptoms Reported: No symptoms reported    Musculoskeletal Musculoskelatal Symptoms Reviewed: Unsteady gait Musculoskeletal Conditions: Joint pain Musculoskeletal Management Strategies: Medication therapy Musculoskeletal Comment: Wrist knees and ankles get sore Falls in the past year?: Yes Number of falls in past year: 1 or less Was there an injury  with Fall?: Yes Fall Risk Category Calculator: 2 Patient Fall Risk Level: Moderate Fall Risk Patient at Risk for Falls Due to: Impaired balance/gait Fall risk Follow up: Falls evaluation completed, Education provided, Falls prevention discussed (He slid off the bed)  Psychosocial       Quality of Family Relationships: supportive Do you feel physically threatened by others?: No      06/02/2023    9:21 AM  Depression screen PHQ 2/9  Decreased Interest 0  Down, Depressed, Hopeless 1  PHQ - 2 Score 1    There were no vitals filed for this visit.  Medications Reviewed Today     Reviewed by Augustin Leber, RN (Registered Nurse) on 06/02/23 at [phone]  Med List Status: <None>   Medication Order Taking? Sig Documenting Provider Last Dose Status Informant  apixaban  (ELIQUIS ) 5 MG TABS tablet [id number] Yes Take 1 tablet (5 mg total) by mouth 2 (two) times daily. Tammie Fall, MD Taking Active   atorvastatin  (LIPITOR) 20 MG tablet [id number] Yes Take 1 tablet (20 mg total) by mouth at bedtime. Tammie Fall, MD Taking Active   carvedilol  (COREG ) 25 MG tablet [id number] Yes TAKE 1 TABLET BY MOUTH TWICE DAILY WITH A MEAL Tammie Fall, MD Taking Active   Cholecalciferol  (VITAMIN D -3) 1000 UNITS CAPS [id number] Yes Take 1,000 Units by mouth daily.  [provider] Taking Active Self  furosemide  (LASIX ) 40 MG tablet [id number] Yes TAKE 1 TABLET BY MOUTH DAILY. HOLD DOSAGE IF WEIGHT < 183 POUNDS Tammie Fall, MD Taking Active   isosorbide  mononitrate (IMDUR ) 30 MG 24 hr tablet [id number] Yes Take 1 tablet (30 mg total) by mouth daily. Tammie Fall, MD Taking Active   losartan  (COZAAR ) 25 MG tablet [id number] Yes TAKE 1/2  TABLET BY MOUTH AT BEDTIME. Tammie Fall, MD Taking Active   omeprazole  (PRILOSEC) 20 MG capsule [id number] Yes TAKE 1 CAPSULE BY MOUTH ONCE DAILY. Tammie Fall, MD Taking Active   oxybutynin (DITROPAN) 5 MG tablet [id number] Yes Take 5 mg by mouth daily. [provider] Taking Active   phenylephrine  (SUDAFED PE) 10 MG TABS tablet [id number] Yes Take 10 mg by mouth every 6 (six) hours as needed (congestion). [provider] Taking Active Self  spironolactone  (ALDACTONE ) 25 MG tablet [id number] Yes Take 0.5 tablets (12.5 mg total) by mouth daily. Tammie Fall, MD Taking Active   Tamsulosin  HCl (FLOMAX ) 0.4 MG CAPS [id number] Yes Take 0.4 mg by mouth daily after breakfast.  [provider] Taking Active Self            Recommendation:   PCP Follow-up  Follow Up Plan:   Telephone follow up appointment with care management team member scheduled for:  07/05/23 9 am         Augustin Leber RN, BSN, [facility]   [facility], [facility]  Care Coordinator Phone: [phone]

## 2023-06-02 NOTE — Patient Instructions (Signed)
 Visit Information  Thank you for taking time to visit with me today. Please don't hesitate to contact me if I can be of assistance to you before our next scheduled appointment.  Your next care management appointment is scheduled for:  07/05/23 9 am     Please call the care guide team at [phone] if you need to cancel, schedule, or reschedule an appointment.   Please call 1-800-273-TALK (toll free, 24 hour hotline) if you are experiencing a Mental Health or Behavioral Health Crisis or need someone to talk to.  Augustin Leber RN, BSN, [facility]  [facility], [facility]  Care Coordinator Phone: [phone]

## 2023-06-23 ENCOUNTER — Ambulatory Visit (INDEPENDENT_AMBULATORY_CARE_PROVIDER_SITE_OTHER): Payer: Medicare Other

## 2023-06-23 DIAGNOSIS — I428 Other cardiomyopathies: Secondary | ICD-10-CM

## 2023-06-26 LAB — CUP PACEART REMOTE DEVICE CHECK
Battery Remaining Longevity: 49 mo
Battery Remaining Percentage: 69 %
Battery Voltage: 2.98 V
Date Time Interrogation Session: 20250620132538
Implantable Lead Connection Status: 753985
Implantable Lead Connection Status: 753985
Implantable Lead Connection Status: 753985
Implantable Lead Implant Date: 20030319
Implantable Lead Implant Date: 20030319
Implantable Lead Implant Date: 20110211
Implantable Lead Location: 753858
Implantable Lead Location: 753859
Implantable Lead Location: 753860
Implantable Lead Model: 158
Implantable Lead Model: 4087
Implantable Lead Serial Number: 113412
Implantable Lead Serial Number: 159867
Implantable Pulse Generator Implant Date: 20221223
Lead Channel Impedance Value: 390 Ohm
Lead Channel Impedance Value: 610 Ohm
Lead Channel Pacing Threshold Amplitude: 0.5 V
Lead Channel Pacing Threshold Amplitude: 1.125 V
Lead Channel Pacing Threshold Pulse Width: 0.5 ms
Lead Channel Pacing Threshold Pulse Width: 1 ms
Lead Channel Sensing Intrinsic Amplitude: 9.9 mV
Lead Channel Setting Pacing Amplitude: 2.5 V
Lead Channel Setting Pacing Amplitude: 2.5 V
Lead Channel Setting Pacing Pulse Width: 0.5 ms
Lead Channel Setting Pacing Pulse Width: 1 ms
Lead Channel Setting Sensing Sensitivity: 2 mV
Pulse Gen Model: 3222
Pulse Gen Serial Number: 3978258

## 2023-06-28 ENCOUNTER — Ambulatory Visit: Payer: Self-pay | Admitting: Internal Medicine

## 2023-07-05 ENCOUNTER — Other Ambulatory Visit: Payer: Self-pay

## 2023-07-05 ENCOUNTER — Telehealth: Payer: Self-pay | Admitting: Pharmacy Technician

## 2023-07-05 NOTE — Telephone Encounter (Signed)
 Faxed more out of pocket to [employer] eliquis  for reconsideration since denial in April   From last encounter

## 2023-07-06 NOTE — Patient Instructions (Signed)
 Visit Information  Thank you for taking time to visit with me today. Please don't hesitate to contact me if I can be of assistance to you before our next scheduled appointment.  Your next care management appointment is by telephone on 8/52/25  at 930 pm   Please call the care guide team at [phone] if you need to cancel, schedule, or reschedule an appointment.   Please call 1-800-273-TALK (toll free, 24 hour hotline) go to [facility] [address] [place] [phone]) call 911 if you are experiencing a Mental Health or Behavioral Health Crisis or need someone to talk to.  Wilbert Diver RN, BSN, [facility]  [facility], [facility]    Care Coordinator Phone: [phone]

## 2023-07-06 NOTE — Patient Outreach (Signed)
 Complex Care Management   Visit Note  07/06/2023  Name:  Mark Nguyen MRN: [id number] DOB: Dec 14, 1931  Situation: Referral received for Complex Care Management related to Atrial Fibrillation I obtained verbal consent from Patient.  Visit completed with patient  on the phone  Background:   Past Medical History:  Diagnosis Date   CAD (coronary artery disease)    CHF (congestive heart failure) (HCC)    Dyslipidemia    History of ventricular fibrillation    HTN (hypertension)    Hx-sudden cardiac arrest    Ischemic cardiomyopathy    LBBB (left bundle branch block)    Ventricular tachycardia (HCC)     Assessment: Patient Reported Symptoms:  Cognitive Cognitive Status: No symptoms reported, Able to follow simple commands, Alert and oriented to person, place, and time, Normal speech and language skills      Neurological      HEENT HEENT Symptoms Reported: No symptoms reported      Cardiovascular Cardiovascular Symptoms Reported: Palpitations Other Cardiovascular Symptoms: Flutters Does patient have uncontrolled Hypertension?: No Cardiovascular Management Strategies: Medication therapy Cardiovascular Self-Management Outcome: 4 (good)  Respiratory Respiratory Symptoms Reported: No symptoms reported    Endocrine Endocrine Symptoms Reported: No symptoms reported    Gastrointestinal Gastrointestinal Symptoms Reported: No symptoms reported      Genitourinary Genitourinary Symptoms Reported: Other Other Genitourinary Symptoms: takes medication to help relieve fluid Genitourinary Management Strategies: Medication therapy  Integumentary Integumentary Symptoms Reported: No symptoms reported    Musculoskeletal Musculoskelatal Symptoms Reviewed: Unsteady gait Musculoskeletal Management Strategies: Medical device Falls in the past year?: Yes (tanglesd in cord and skined elbow) Number of falls in past year: 1 or less Was there an injury with Fall?: Yes Fall Risk Category  Calculator: 2 Patient Fall Risk Level: Moderate Fall Risk Patient at Risk for Falls Due to: History of fall(s), Impaired balance/gait, Impaired mobility Fall risk Follow up: Falls evaluation completed, Education provided, Falls prevention discussed  Psychosocial       Quality of Family Relationships: supportive Do you feel physically threatened by others?: No      07/05/2023   10:03 AM  Depression screen PHQ 2/9  Decreased Interest 0  Down, Depressed, Hopeless 0  PHQ - 2 Score 0    There were no vitals filed for this visit.  Medications Reviewed Today     Reviewed by Weyman Corning, RN (Registered Nurse) on 07/05/23 at 0957  Med List Status: <None>   Medication Order Taking? Sig Documenting Provider Last Dose Status Informant  apixaban  (ELIQUIS ) 5 MG TABS tablet [id number] Yes Take 1 tablet (5 mg total) by mouth 2 (two) times daily. Waddell Danelle ORN, MD  Active   atorvastatin  (LIPITOR) 20 MG tablet [id number] Yes Take 1 tablet (20 mg total) by mouth at bedtime. Waddell Danelle ORN, MD  Active   carvedilol  (COREG ) 25 MG tablet [id number] Yes TAKE 1 TABLET BY MOUTH TWICE DAILY WITH A MEAL Waddell Danelle ORN, MD  Active   Cholecalciferol  (VITAMIN D -3) 1000 UNITS CAPS [id number] Yes Take 1,000 Units by mouth daily.  [provider]  Active Self  furosemide  (LASIX ) 40 MG tablet [id number] Yes TAKE 1 TABLET BY MOUTH DAILY. HOLD DOSAGE IF WEIGHT < 183 POUNDS Waddell Danelle ORN, MD  Active   isosorbide  mononitrate (IMDUR ) 30 MG 24 hr tablet [id number] Yes Take 1 tablet (30 mg total) by mouth daily. Waddell Danelle ORN, MD  Active   losartan  (COZAAR ) 25 MG tablet [id number] Yes TAKE 1/2 TABLET BY MOUTH AT  BEDTIME. Waddell Danelle ORN, MD  Active   omeprazole  (PRILOSEC) 20 MG capsule [id number] Yes TAKE 1 CAPSULE BY MOUTH ONCE DAILY. Waddell Danelle ORN, MD  Active   oxybutynin (DITROPAN) 5 MG tablet [id number] Yes Take 5 mg by mouth daily. [provider]  Active   phenylephrine  (SUDAFED PE) 10 MG TABS  tablet [id number] Yes Take 10 mg by mouth every 6 (six) hours as needed (congestion). [provider]  Active Self  spironolactone  (ALDACTONE ) 25 MG tablet [id number] Yes Take 0.5 tablets (12.5 mg total) by mouth daily. Waddell Danelle ORN, MD  Active   Tamsulosin  HCl (FLOMAX ) 0.4 MG CAPS [id number] Yes Take 0.4 mg by mouth daily after breakfast.  [provider]  Active Self            Recommendation:   PCP Follow-up  Follow Up Plan:   Telephone follow up appointment with care management team member scheduled for:  08/08/23 930 am   Wilbert Diver RN, BSN, [facility]  [facility], [facility]    Care Coordinator Phone: [phone]

## 2023-07-10 NOTE — Telephone Encounter (Signed)
   Sent sandy newlin a message as requested

## 2023-07-10 NOTE — Telephone Encounter (Signed)
 I called [facility] and they said is application still under review

## 2023-07-13 NOTE — Progress Notes (Signed)
 Cardiology Office Note Date:  07/13/2023  Patient ID:  Mark Nguyen, Mark Nguyen 1931/10/24, MRN [id number] PCP:  Napoleon Limes, MD  Cardiologist/Electrophysiologist: Dr. Waddell    Chief Complaint:  overdue annual visit   History of Present Illness: Mark Nguyen is a 89 y.o. male with history of  LBBB, ICM, chronic CHF (systolic),  HTN, HLD,  VT (s/p remote ablation), ICD (subsequently has been downgraded to CRT-P at his last gen change) AFib CAD (chart reveiw mentions he had hx of remote MI and PCI 1992)  He saw Dr. Waddell 06/17/22, feeling better post AV node ablation, no VT (post remote ablation)  Described class II symptoms  TODAY  He is accompanied by his daughter in law He continues to live independently, goes every morning out to breakfast Denies trouble with ADLs Family very supportive Denies CP, palpitations No SOB  Denies dizzy spells, near syncope or syncope No bleeding or signs of b leeding though Eliquis  quite expensive until his deductible is met > now has zero copay  Uncertain about a PMD, he does not recognized Dr. Napoleon as anyone he has seen Says he has seen someone at [facility], perhaps a Dr. Francella, on the ground floor of the hospital Uncertain when he last saw a PMD. [facility] team has been helping with his Eliquis  cost Discussed importance of seeing a primary provider   Device information St. Jude ICD implanted 2003 -> Biv upgrade 2011, gen change 05/2014 , 12/25/2020 DOWNGRADED to CRT-P  He has SJM LV lead (2011) Guidant RA/RV leads (2003) (Capped HV pin)  History of appropriate therapy: Yes (VT ablation history  History of AAD therapy:  amiodarone  >> AV node ablation  and stopped   Past Medical History:  Diagnosis Date   CAD (coronary artery disease)    CHF (congestive heart failure) (HCC)    Dyslipidemia    History of ventricular fibrillation    HTN (hypertension)    Hx-sudden cardiac arrest    Ischemic cardiomyopathy    LBBB (left  bundle branch block)    Ventricular tachycardia (HCC)     Past Surgical History:  Procedure Laterality Date   AV NODE ABLATION N/A 12/25/2020   Procedure: AV NODE ABLATION;  Surgeon: Waddell Danelle ORN, MD;  Location: [facility] INVASIVE CV LAB;  Service: Cardiovascular;  Laterality: N/A;   BIV PACEMAKER INSERTION CRT-P N/A 12/25/2020   Procedure: DOWNGRADE BIV PACEMAKER INSERTION CRT-P;  Surgeon: Waddell Danelle ORN, MD;  Location: [facility] INVASIVE CV LAB;  Service: Cardiovascular;  Laterality: N/A;   CARDIAC CATHETERIZATION  10/12/2008   CARDIAC DEFIBRILLATOR PLACEMENT     St Jude   CARDIOVERSION N/A 06/28/2019   Procedure: CARDIOVERSION;  Surgeon: Pietro Redell RAMAN, MD;  Location: [facility] ENDOSCOPY;  Service: Cardiovascular;  Laterality: N/A;   CARDIOVERSION N/A 02/14/2020   Procedure: CARDIOVERSION;  Surgeon: Francyne Headland, MD;  Location: [facility] ENDOSCOPY;  Service: Cardiovascular;  Laterality: N/A;   DOPPLER ECHOCARDIOGRAPHY  2008, 2011   EP IMPLANTABLE DEVICE N/A 05/19/2014   Procedure: ICD/BIV ICD Generator Changeout;  Surgeon: Danelle ORN Waddell, MD;  Location: [facility] INVASIVE CV LAB;  Service: Cardiovascular;  Laterality: N/A;   TONSILECTOMY, ADENOIDECTOMY, BILATERAL MYRINGOTOMY AND TUBES      Current Outpatient Medications  Medication Sig Dispense Refill   apixaban  (ELIQUIS ) 5 MG TABS tablet Take 1 tablet (5 mg total) by mouth 2 (two) times daily. 180 tablet 1   atorvastatin  (LIPITOR) 20 MG tablet Take 1 tablet (20 mg total) by mouth at bedtime.  90 tablet 3   carvedilol  (COREG ) 25 MG tablet TAKE 1 TABLET BY MOUTH TWICE DAILY WITH A MEAL 180 tablet 0   Cholecalciferol  (VITAMIN D -3) 1000 UNITS CAPS Take 1,000 Units by mouth daily.      furosemide  (LASIX ) 40 MG tablet TAKE 1 TABLET BY MOUTH DAILY. HOLD DOSAGE IF WEIGHT < 183 POUNDS 90 tablet 3   isosorbide  mononitrate (IMDUR ) 30 MG 24 hr tablet Take 1 tablet (30 mg total) by mouth daily. 90 tablet 3   losartan  (COZAAR ) 25 MG tablet TAKE 1/2 TABLET BY MOUTH AT  BEDTIME. 45 tablet 0   omeprazole  (PRILOSEC) 20 MG capsule TAKE 1 CAPSULE BY MOUTH ONCE DAILY. 90 capsule 0   oxybutynin (DITROPAN) 5 MG tablet Take 5 mg by mouth daily.     phenylephrine  (SUDAFED PE) 10 MG TABS tablet Take 10 mg by mouth every 6 (six) hours as needed (congestion).     spironolactone  (ALDACTONE ) 25 MG tablet Take 0.5 tablets (12.5 mg total) by mouth daily. 45 tablet 0   Tamsulosin  HCl (FLOMAX ) 0.4 MG CAPS Take 0.4 mg by mouth daily after breakfast.      No current facility-administered medications for this visit.    Allergies:   Patient has no known allergies.   Social History:  The patient  reports that he quit smoking about 38 years ago. His smoking use included cigarettes. He has never used smokeless tobacco. He reports that he does not drink alcohol and does not use drugs.   Family History:  The patient's family history includes Coronary artery disease in an other family member.  ROS:  Please see the history of present illness.    All other systems are reviewed and otherwise negative.   PHYSICAL EXAM:  VS:  There were no vitals taken for this visit. BMI: There is no height or weight on file to calculate BMI. Well nourished, well developed, in no acute distress HEENT: normocephalic, atraumatic Neck: no JVD, carotid bruits or masses Cardiac:  RRR,  no significant murmurs, no rubs, or gallops Lungs: CTA b/l, no wheezing, rhonchi or rales Abd: soft, nontender MS: no deformity, age appropriate atrophy Ext:  no edema Skin: warm and dry, no rash Neuro:  No gross deficits appreciated Psych: euthymic mood, full affect  ICD site is stable, no tethering or discomfort   EKG:  Done today and reviewed by myself shows  AFib/V paced, 82bpm, PVC  Device interrogation done today and reviewed by myself:  Battery and lead measurements are stable No HVR episodes Programmed VVIR 80 96% BP   11/15/2018: TTE IMPRESSIONS  1. There is akinesis of the entire inferior wall,  extending into the  apex. 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  Globally the EF is ~35-40%. There is global hypokinesis in addition to the  above regional wall motion  abnormalities. Contrast was used and there is no evidence of thrombus in  the LV apex. This is consistent with a prior inferior wall infarction. The  last echo report from 2011 reports an EF 40-45% but was unable to be  viewed for comparison.   2. Left ventricular diastolic parameters are consistent with Grade III  diastolic dysfunction (restrictive).   3. Elevated left atrial pressure.   4. Definity  contrast agent was given IV to delineate the left ventricular  endocardial borders.   5. Mildly dilated left ventricular internal cavity size.   6. The left ventricle demonstrates regional wall motion abnormalities.  7. Global right ventricle has mildly reduced systolic function.The right  ventricular size is normal. No increase in right ventricular wall  thickness.   8. Left atrial size was severely dilated.   9. Right atrial size was moderately dilated.  10. The tricuspid valve is grossly normal. Tricuspid valve regurgitation  is mild.  11. The aortic valve is tricuspid. Aortic valve regurgitation is mild.  Mild to moderate aortic valve sclerosis/calcification without any evidence  of aortic stenosis.  12. The pulmonic valve was grossly normal. Pulmonic valve regurgitation is  trivial.  13. Moderately elevated pulmonary artery systolic pressure.  14. The tricuspid regurgitant velocity is 3.01 m/s, and with an assumed  right atrial pressure of 15 mmHg, the estimated right ventricular systolic  pressure is moderately elevated at 51.2 mmHg.  15. A venous catheter is visualized in the RA and RV.  16. The inferior vena cava is dilated in size with <50% respiratory  variability, suggesting right atrial pressure of 15 mmHg.  17. The mitral valve is degenerative. Mild mitral valve regurgitation.   18. Mild mitral annular calcification.    01/16/2018: stress myoview  The left ventricular ejection fraction is severely decreased (<30%). Nuclear stress EF: 24%. There was no ST segment deviation noted during stress. Defect 1: There is a large defect of severe severity present in the basal inferoseptal, basal inferior, basal inferolateral, mid inferoseptal, mid inferior, apical inferior and apex location. Findings consistent with prior myocardial infarction. This is a high risk study.   There is a large irreversible defect in the basal and mid inferoseptal, inferior, basal inferolateral, apical inferior walls and in the true apex consistent with a large infarct in the RCA territory. This study is high risk given large infarct and severe LV dysfunction but there is no ischemia.      Recent Labs: 12/23/2022: BUN 17; Creatinine, Ser 1.08; Potassium 4.3; Sodium 144  No results found for requested labs within last 365 days.   CrCl cannot be calculated (Patient's most recent lab result is older than the maximum 21 days allowed.).   Wt Readings from Last 3 Encounters:  07/27/22 175 lb (79.4 kg)  06/17/22 172 lb (78 kg)  08/26/21 176 lb 1.6 oz (79.9 kg)     Other studies reviewed: Additional studies/records reviewed today include: summarized above  ASSESSMENT AND PLAN:  1. PPM     Intact function     no programming changes made  2. ICM 3. Chronic CHF (systolic)     On BB, lasix , nitrate, ARB, spironolactone      No symptoms or esam findings of volume OL     CorVue at threshold, wobbles up/down      4. CAD     No anginal symptoms     On BB, statin, no ASA with eliquis   5. Permanent AFib     S/p AV node ablation     CHA2DS2Vasc is 5, on eliquis , appropriately dosed      7. VT     Per Dr. Adrian note, hx of remote VT ablation     None noted  8. Secondary hypercoagulable state    His daughter mentions to me in the hall, that he has had unintentional weight loss,  reports he eats well Advised that they get re-established revisit with PMD to w/u  Labs today    Disposition: remoets as usual, back in clinic in 6 mo, sooner if needed   Current medicines are reviewed at length with the patient today.  The  patient did not have any concerns regarding medicines.  Bonney Charlies Arthur, PA-C 07/13/2023 1:01 PM     [facility] [address] [place] [address] [phone] (office)  [phone] (fax)

## 2023-07-17 ENCOUNTER — Ambulatory Visit: Attending: Physician Assistant | Admitting: Physician Assistant

## 2023-07-17 ENCOUNTER — Encounter: Payer: Self-pay | Admitting: Physician Assistant

## 2023-07-17 VITALS — BP 110/62 | HR 82 | Ht 67.0 in | Wt 162.9 lb

## 2023-07-17 DIAGNOSIS — D6869 Other thrombophilia: Secondary | ICD-10-CM | POA: Diagnosis not present

## 2023-07-17 DIAGNOSIS — I472 Ventricular tachycardia, unspecified: Secondary | ICD-10-CM | POA: Diagnosis not present

## 2023-07-17 DIAGNOSIS — I255 Ischemic cardiomyopathy: Secondary | ICD-10-CM | POA: Diagnosis not present

## 2023-07-17 DIAGNOSIS — I4821 Permanent atrial fibrillation: Secondary | ICD-10-CM | POA: Insufficient documentation

## 2023-07-17 DIAGNOSIS — Z950 Presence of cardiac pacemaker: Secondary | ICD-10-CM | POA: Diagnosis not present

## 2023-07-17 DIAGNOSIS — I2510 Atherosclerotic heart disease of native coronary artery without angina pectoris: Secondary | ICD-10-CM | POA: Insufficient documentation

## 2023-07-17 DIAGNOSIS — I4819 Other persistent atrial fibrillation: Secondary | ICD-10-CM | POA: Diagnosis not present

## 2023-07-17 DIAGNOSIS — I5022 Chronic systolic (congestive) heart failure: Secondary | ICD-10-CM | POA: Insufficient documentation

## 2023-07-17 LAB — CUP PACEART INCLINIC DEVICE CHECK
Date Time Interrogation Session: 20250714101139
Implantable Lead Connection Status: 753985
Implantable Lead Connection Status: 753985
Implantable Lead Connection Status: 753985
Implantable Lead Implant Date: 20030319
Implantable Lead Implant Date: 20030319
Implantable Lead Implant Date: 20110211
Implantable Lead Location: 753858
Implantable Lead Location: 753859
Implantable Lead Location: 753860
Implantable Lead Model: 158
Implantable Lead Model: 4087
Implantable Lead Serial Number: 113412
Implantable Lead Serial Number: 159867
Implantable Pulse Generator Implant Date: 20221223
Pulse Gen Model: 3222
Pulse Gen Serial Number: 3978258

## 2023-07-17 MED ORDER — SPIRONOLACTONE 25 MG PO TABS: 12.5000 mg | ORAL_TABLET | Freq: Every day | ORAL | 3 refills | Status: AC

## 2023-07-17 MED ORDER — ISOSORBIDE MONONITRATE ER 30 MG PO TB24: 30.0000 mg | ORAL_TABLET | Freq: Every day | ORAL | 3 refills | Status: AC

## 2023-07-17 MED ORDER — ATORVASTATIN CALCIUM 20 MG PO TABS
20.0000 mg | ORAL_TABLET | Freq: Every day | ORAL | 3 refills | Status: AC
Start: 2023-07-17 — End: ?

## 2023-07-17 MED ORDER — APIXABAN 5 MG PO TABS: 5.0000 mg | ORAL_TABLET | Freq: Two times a day (BID) | ORAL | 3 refills | Status: AC

## 2023-07-17 MED ORDER — LOSARTAN POTASSIUM 25 MG PO TABS: 12.5000 mg | ORAL_TABLET | Freq: Every day | ORAL | 3 refills | Status: AC

## 2023-07-17 MED ORDER — FUROSEMIDE 40 MG PO TABS: ORAL_TABLET | ORAL | 3 refills | Status: DC

## 2023-07-17 MED ORDER — CARVEDILOL 25 MG PO TABS
25.0000 mg | ORAL_TABLET | Freq: Two times a day (BID) | ORAL | 3 refills | Status: AC
Start: 2023-07-17 — End: ?

## 2023-07-17 NOTE — Patient Instructions (Addendum)
 Medication Instructions:    Your physician recommends that you continue on your current medications as directed. Please refer to the Current Medication list given to you today.   *If you need a refill on your cardiac medications before your next appointment, please call your pharmacy*   Lab Work:   PLEASE GO DOWN STAIRS  [facility]  FIRST FLOOR   ( GET OFF ELEVATORS WALK TOWARDS WAITING AREA LAB LOCATED BY PHARMACY): CMET  LIPIDS AND CBC TODAY     If you have labs (blood work) drawn today and your tests are completely normal, you will receive your results only by: MyChart Message (if you have MyChart) OR A paper copy in the mail If you have any lab test that is abnormal or we need to change your treatment, we will call you to review the results.   Testing/Procedures:  NONE ORDERED  TODAY     Follow-Up: At [facility], you and your health needs are our priority.  As part of our continuing mission to provide you with exceptional heart care, our providers are all part of one team.  This team includes your primary Cardiologist (physician) and Advanced Practice Providers or APPs (Physician Assistants and Nurse Practitioners) who all work together to provide you with the care you need, when you need it.   Your next appointment:    6 month(s)     Provider:    Charlies Arthur, PA-C    We recommend signing up for the patient portal called MyChart.  Sign up information is provided on this After Visit Summary.  MyChart is used to connect with patients for Virtual Visits (Telemedicine).  Patients are able to view lab/test results, encounter notes, upcoming appointments, etc.  Non-urgent messages can be sent to your provider as well.   To learn more about what you can do with MyChart, go to [email or web address].    Other Instructions

## 2023-07-18 ENCOUNTER — Ambulatory Visit: Payer: Self-pay | Admitting: Physician Assistant

## 2023-07-18 LAB — CBC
Hematocrit: 39.4 % (ref 37.5–51.0)
Hemoglobin: 12.3 g/dL — ABNORMAL LOW (ref 13.0–17.7)
MCH: 27 pg (ref 26.6–33.0)
MCHC: 31.2 g/dL — ABNORMAL LOW (ref 31.5–35.7)
MCV: 87 fL (ref 79–97)
Platelets: 155 x10E3/uL (ref 150–450)
RBC: 4.55 x10E6/uL (ref 4.14–5.80)
RDW: 15 % (ref 11.6–15.4)
WBC: 5.9 x10E3/uL (ref 3.4–10.8)

## 2023-07-18 LAB — LIPID PANEL
Chol/HDL Ratio: 3.4 ratio (ref 0.0–5.0)
Cholesterol, Total: 120 mg/dL (ref 100–199)
HDL: 35 mg/dL — ABNORMAL LOW (ref >39)
LDL Chol Calc (NIH): 62 mg/dL (ref 0–99)
Triglycerides: 130 mg/dL (ref 0–149)
VLDL Cholesterol Cal: 23 mg/dL (ref 5–40)

## 2023-08-02 ENCOUNTER — Ambulatory Visit: Payer: Medicare Other

## 2023-08-02 VITALS — Ht 67.0 in | Wt 162.0 lb

## 2023-08-02 DIAGNOSIS — Z0000 Encounter for general adult medical examination without abnormal findings: Secondary | ICD-10-CM | POA: Diagnosis not present

## 2023-08-02 NOTE — Progress Notes (Addendum)
 Because this visit was a virtual/telehealth visit,  certain criteria was not obtained, such a blood pressure, CBG if applicable, and timed get up and go. Any medications not marked as taking were not mentioned during the medication reconciliation part of the visit. Any vitals not documented were not able to be obtained due to this being a telehealth visit or patient was unable to self-report a recent blood pressure reading due to a lack of equipment at home via telehealth. Vitals that have been documented are verbally provided by the patient.   Subjective:   Mark Nguyen is a 89 y.o. who presents for a Medicare Wellness preventive visit.  As a reminder, Annual Wellness Visits don't include a physical exam, and some assessments may be limited, especially if this visit is performed virtually. We may recommend an in-person follow-up visit with your provider if needed.  Visit Complete: Virtual I connected with  Mark Nguyen on 08/02/23 by a audio enabled telemedicine application and verified that I am speaking with the correct person using two identifiers.  Patient Location: Home  Provider Location: Home Office  I discussed the limitations of evaluation and management by telemedicine. The patient expressed understanding and agreed to proceed.  Vital Signs: Because this visit was a virtual/telehealth visit, some criteria may be missing or patient reported. Any vitals not documented were not able to be obtained and vitals that have been documented are patient reported.  VideoDeclined- This patient declined Librarian, academic. Therefore the visit was completed with audio only.  Persons Participating in Visit: Patient.  AWV Questionnaire: No: Patient Medicare AWV questionnaire was not completed prior to this visit.  Cardiac Risk Factors include: advanced age (>83men, >8 women);diabetes mellitus;dyslipidemia;hypertension;male gender;sedentary lifestyle      Objective:    Today's Vitals   08/02/23 1343  Weight: 162 lb (73.5 kg)  Height: 5' 7 (1.702 m)  PainSc: 0-No pain   Body mass index is 25.37 kg/m.     08/02/2023    1:46 PM 07/27/2022    9:36 AM 07/28/2021    2:12 PM 06/28/2021   10:49 AM 06/28/2021    9:51 AM 05/28/2021    9:31 AM 06/28/2019    9:27 AM  Advanced Directives  Does Patient Have a Medical Advance Directive? No Yes No No No No No  Type of Furniture conservator/restorer;Living will       Copy of Healthcare Power of Attorney in Chart?  No - copy requested       Would patient like information on creating a medical advance directive? No - Patient declined  No - Patient declined No - Patient declined No - Patient declined No - Patient declined No - Patient declined    Current Medications (verified) Outpatient Encounter Medications as of 08/02/2023  Medication Sig   apixaban  (ELIQUIS ) 5 MG TABS tablet Take 1 tablet (5 mg total) by mouth 2 (two) times daily.   atorvastatin  (LIPITOR) 20 MG tablet Take 1 tablet (20 mg total) by mouth at bedtime.   carvedilol  (COREG ) 25 MG tablet Take 1 tablet (25 mg total) by mouth 2 (two) times daily with a meal.   furosemide  (LASIX ) 40 MG tablet TAKE 1 TABLET BY MOUTH DAILY. HOLD DOSAGE IF WEIGHT < 183 POUNDS   isosorbide  mononitrate (IMDUR ) 30 MG 24 hr tablet Take 1 tablet (30 mg total) by mouth daily.   losartan  (COZAAR ) 25 MG tablet Take 0.5 tablets (12.5 mg total) by mouth  at bedtime.   omeprazole  (PRILOSEC) 20 MG capsule TAKE 1 CAPSULE BY MOUTH ONCE DAILY.   oxybutynin (DITROPAN) 5 MG tablet Take 5 mg by mouth daily.   spironolactone  (ALDACTONE ) 25 MG tablet Take 0.5 tablets (12.5 mg total) by mouth daily.   Tamsulosin  HCl (FLOMAX ) 0.4 MG CAPS Take 0.4 mg by mouth daily after breakfast.    No facility-administered encounter medications on file as of 08/02/2023.    Allergies (verified) Patient has no known allergies.   History: Past Medical History:  Diagnosis  Date   CAD (coronary artery disease)    CHF (congestive heart failure) (HCC)    Dyslipidemia    History of ventricular fibrillation    HTN (hypertension)    Hx-sudden cardiac arrest    Ischemic cardiomyopathy    LBBB (left bundle branch block)    Ventricular tachycardia (HCC)    Past Surgical History:  Procedure Laterality Date   AV NODE ABLATION N/A 12/25/2020   Procedure: AV NODE ABLATION;  Surgeon: Waddell Danelle ORN, MD;  Location: [facility] INVASIVE CV LAB;  Service: Cardiovascular;  Laterality: N/A;   BIV PACEMAKER INSERTION CRT-P N/A 12/25/2020   Procedure: DOWNGRADE BIV PACEMAKER INSERTION CRT-P;  Surgeon: Waddell Danelle ORN, MD;  Location: [facility] INVASIVE CV LAB;  Service: Cardiovascular;  Laterality: N/A;   CARDIAC CATHETERIZATION  10/12/2008   CARDIAC DEFIBRILLATOR PLACEMENT     St Jude   CARDIOVERSION N/A 06/28/2019   Procedure: CARDIOVERSION;  Surgeon: Pietro Redell RAMAN, MD;  Location: [facility] ENDOSCOPY;  Service: Cardiovascular;  Laterality: N/A;   CARDIOVERSION N/A 02/14/2020   Procedure: CARDIOVERSION;  Surgeon: Francyne Headland, MD;  Location: [facility] ENDOSCOPY;  Service: Cardiovascular;  Laterality: N/A;   DOPPLER ECHOCARDIOGRAPHY  2008, 2011   EP IMPLANTABLE DEVICE N/A 05/19/2014   Procedure: ICD/BIV ICD Generator Changeout;  Surgeon: Danelle ORN Waddell, MD;  Location: [facility] [place] INVASIVE CV LAB;  Service: Cardiovascular;  Laterality: N/A;   TONSILECTOMY, ADENOIDECTOMY, BILATERAL MYRINGOTOMY AND TUBES     Family History  Problem Relation Age of Onset   Coronary artery disease Other    Social History   Socioeconomic History   Marital status: Divorced    Spouse name: Not on file   Number of children: Not on file   Years of education: Not on file   Highest education level: Not on file  Occupational History   Occupation: furniture truck driver  Tobacco Use   Smoking status: Former    Current packs/day: 0.00    Types: Cigarettes    Quit date: 01/03/1985    Years since quitting: 38.6   Smokeless  tobacco: Never  Vaping Use   Vaping status: Never Used  Substance and Sexual Activity   Alcohol use: Never   Drug use: Never   Sexual activity: Not on file  Other Topics Concern   Not on file  Social History Narrative   Not on file   Social Drivers of Health   Financial Resource Strain: Low Risk  (08/02/2023)   Overall Financial Resource Strain (CARDIA)    Difficulty of Paying Living Expenses: Not hard at all  Food Insecurity: No Food Insecurity (08/02/2023)   Hunger Vital Sign    Worried About Running Out of Food in the Last Year: Never true    Ran Out of Food in the Last Year: Never true  Transportation Needs: No Transportation Needs (08/02/2023)   PRAPARE - Administrator, Civil Service (Medical): No    Lack of Transportation (Non-Medical): No  Physical  Activity: Sufficiently Active (08/02/2023)   Exercise Vital Sign    Days of Exercise per Week: 5 days    Minutes of Exercise per Session: 30 min  Stress: No Stress Concern Present (08/02/2023)   [employer] of Occupational Health - Occupational Stress Questionnaire    Feeling of Stress: Not at all  Social Connections: Socially Isolated (08/02/2023)   Social Connection and Isolation Panel    Frequency of Communication with Friends and Family: Never    Frequency of Social Gatherings with Friends and Family: More than three times a week    Attends Religious Services: Never    Database administrator or Organizations: No    Attends Engineer, structural: Never    Marital Status: Divorced    Tobacco Counseling Counseling given: Not Answered    Clinical Intake:  Pre-visit preparation completed: Yes  Pain : No/denies pain Pain Score: 0-No pain     BMI - recorded: 25.37 Nutritional Status: BMI 25 -29 Overweight Nutritional Risks: None Diabetes: Yes CBG done?: No Did pt. bring in CBG monitor from home?: No  Lab Results  Component Value Date   HGBA1C 5.6 11/22/2018     How often do you  need to have someone help you when you read instructions, pamphlets, or other written materials from your doctor or pharmacy?: 1 - Never  Interpreter Needed?: No  Information entered by :: Gwendolyn Nishi N. Zerina Hallinan, LPN.   Activities of Daily Living     08/02/2023    1:49 PM  In your present state of health, do you have any difficulty performing the following activities:  Hearing? 1  Vision? 0  Difficulty concentrating or making decisions? 0  Walking or climbing stairs? 1  Dressing or bathing? 0  Doing errands, shopping? 0  Preparing Food and eating ? N  Using the Toilet? N  In the past six months, have you accidently leaked urine? Y  Do you have problems with loss of bowel control? N  Managing your Medications? N  Managing your Finances? N  Housekeeping or managing your Housekeeping? N    Patient Care Team: Napoleon Limes, MD as PCP - General Waddell Danelle ORN, MD as PCP - Electrophysiology (Cardiology) Lesia Ozell Barter, PA-C as Physician Assistant (Physician Assistant) Lou Claretta HERO, MD as Resident (Internal Medicine) Arno Rosaline SQUIBB, RN as Registered Nurse Marda General, MD as Consulting Physician (Urology) Vannie Elsie HERO, OD as Consulting Physician (Ophthalmology)  I have updated your Care Teams any recent Medical Services you may have received from other providers in the past year.     Assessment:   This is a routine wellness examination for Mark Nguyen.  Hearing/Vision screen Hearing Screening - Comments:: Patient has difficulty hearing. No hearing aids. Vision Screening - Comments:: Wears rx glasses - up to date with routine eye exams with [facility] on [place], [place].    Goals Addressed             This Visit's Progress    08/02/23: To stay independent and active around my house.         Depression Screen     08/02/2023    1:55 PM 07/05/2023   10:03 AM 06/02/2023    9:21 AM 05/02/2023    9:38 AM 07/28/2021    2:12 PM 06/28/2021    10:46 AM 06/28/2021    9:48 AM  PHQ 2/9 Scores  PHQ - 2 Score 0 0 1 0 0 0 0  PHQ- 9 Score 0  Fall Risk     08/02/2023    1:47 PM 07/05/2023    9:59 AM 06/02/2023    9:19 AM 05/02/2023    9:37 AM 07/27/2022    9:12 AM  Fall Risk   Falls in the past year? 1 1 1  0 1  Comment  tanglesd in cord and skined elbow     Number falls in past yr: 1 0 0  0  Injury with Fall? 1 1 1  1   Comment     per patient broke his hip  Risk for fall due to : History of fall(s);Impaired balance/gait;Orthopedic patient History of fall(s);Impaired balance/gait;Impaired mobility Impaired balance/gait  History of fall(s);Impaired balance/gait;Orthopedic patient  Follow up Falls evaluation completed;Education provided Falls evaluation completed;Education provided;Falls prevention discussed Falls evaluation completed;Education provided;Falls prevention discussed  Education provided;Falls prevention discussed;Falls evaluation completed  Comment   He slid off the bed      MEDICARE RISK AT HOME:  Medicare Risk at Home Any stairs in or around the home?: No If so, are there any without handrails?: No Home free of loose throw rugs in walkways, pet beds, electrical cords, etc?: Yes Adequate lighting in your home to reduce risk of falls?: Yes Life alert?: No Use of a cane, walker or w/c?: Yes Grab bars in the bathroom?: Yes Shower chair or bench in shower?: Yes Elevated toilet seat or a handicapped toilet?: No  TIMED UP AND GO:  Was the test performed?  No  Cognitive Function: Declined/Normal: No cognitive concerns noted by patient or family. Patient alert, oriented, able to answer questions appropriately and recall recent events. No signs of memory loss or confusion.    08/02/2023    1:48 PM  MMSE - Mini Mental State Exam  Not completed: Unable to complete        08/02/2023    2:02 PM 07/27/2022    9:15 AM 06/28/2021   10:51 AM  6CIT Screen  What Year? 0 points 0 points 0 points  What month? 0 points 0  points 0 points  What time? 0 points 0 points 0 points  Count back from 20 0 points 0 points 0 points  Months in reverse 0 points --   Repeat phrase 0 points 0 points 0 points  Total Score 0 points      Immunizations Immunization History  Administered Date(s) Administered   Fluad Quad(high Dose 65+) 10/24/2018   Influenza-Unspecified 10/06/2017, 10/17/2019, 10/12/2020   PFIZER(Purple Top)SARS-COV-2 Vaccination 01/16/2019, 02/06/2019, 10/10/2019    Screening Tests Health Maintenance  Topic Date Due   DTaP/Tdap/Td (1 - Tdap) Never done   Pneumococcal Vaccine: 50+ Years (1 of 2 - PCV) Never done   Zoster Vaccines- Shingrix (1 of 2) Never done   COVID-19 Vaccine (4 - 2024-25 season) 09/04/2022   INFLUENZA VACCINE  08/04/2023   Medicare Annual Wellness (AWV)  08/01/2024   Hepatitis B Vaccines  Aged Out   HPV VACCINES  Aged Out   Meningococcal B Vaccine  Aged Out    Health Maintenance  Health Maintenance Due  Topic Date Due   DTaP/Tdap/Td (1 - Tdap) Never done   Pneumococcal Vaccine: 50+ Years (1 of 2 - PCV) Never done   Zoster Vaccines- Shingrix (1 of 2) Never done   COVID-19 Vaccine (4 - 2024-25 season) 09/04/2022   Health Maintenance Items Addressed: Yes Patient aware of current care gaps.  Immunization record was verified by [facility] and updated in patient's chart. Patient is due for Covid-19, Dtap ,  Pneumococcal and Shingrix vaccines.  Additional Screening:  Vision Screening: Recommended annual ophthalmology exams for early detection of glaucoma and other disorders of the eye. Would you like a referral to an eye doctor? No    Dental Screening: Recommended annual dental exams for proper oral hygiene  Community Resource Referral / Chronic Care Management: CRR required this visit?  No   CCM required this visit?  No   Plan:    I have personally reviewed and noted the following in the patient's chart:   Medical and social history Use of alcohol, tobacco or illicit  drugs  Current medications and supplements including opioid prescriptions. Patient is not currently taking opioid prescriptions. Functional ability and status Nutritional status Physical activity Advanced directives List of other physicians Hospitalizations, surgeries, and ER visits in previous 12 months Vitals Screenings to include cognitive, depression, and falls Referrals and appointments  In addition, I have reviewed and discussed with patient certain preventive protocols, quality metrics, and best practice recommendations. A written personalized care plan for preventive services as well as general preventive health recommendations were provided to patient.   Roz LOISE Fuller, LPN   2/[id number]/7974   After Visit Summary: (Declined) Due to this being a telephonic visit, with patients personalized plan was offered to patient but patient Declined AVS at this time   Notes: Patient aware of current care gaps.  Immunization record was verified by [facility] and updated in patient's chart. Patient is due for Covid-19, Dtap , Pneumococcal and Shingrix vaccines.  Internal Medicine Attending:  I reviewed the AWV findings of the medical professional who conducted the visit. I was present in the office suite and immediately available to provide assistance and direction throughout the time the service was provided.

## 2023-08-02 NOTE — Patient Instructions (Signed)
 Mark Nguyen , Thank you for taking time out of your busy schedule to complete your Annual Wellness Visit with me. I enjoyed our conversation and look forward to speaking with you again next year. I, as well as your care team,  appreciate your ongoing commitment to your health goals. Please review the following plan we discussed and let me know if I can assist you in the future. Your Game plan/ To Do List    Referrals: If you haven't heard from the office you've been referred to, please reach out to them at the phone provided.   Follow up Visits: Next Medicare AWV with our clinical staff: 08/07/2024 at 1:40 pm phone visit with Nurse Health Advisor.   Have you seen your provider in the last 6 months (3 months if uncontrolled diabetes)? No Next Office Visit with your provider: Office will call patient to schedule an appointment.  Clinician Recommendations:  Aim for 30 minutes of exercise or brisk walking, 6-8 glasses of water, and 5 servings of fruits and vegetables each day.       This is a list of the screening recommended for you and due dates:  Health Maintenance  Topic Date Due   DTaP/Tdap/Td vaccine (1 - Tdap) Never done   Pneumococcal Vaccine for age over 13 (1 of 2 - PCV) Never done   Zoster (Shingles) Vaccine (1 of 2) Never done   COVID-19 Vaccine (4 - 2024-25 season) 09/04/2022   Flu Shot  08/04/2023   Medicare Annual Wellness Visit  08/01/2024   Hepatitis B Vaccine  Aged Out   HPV Vaccine  Aged Out   Meningitis B Vaccine  Aged Out    Advanced directives: (Declined) Advance directive discussed with you today. Even though you declined this today, please call our office should you change your mind, and we can give you the proper paperwork for you to fill out. Advance Care Planning is important because it:  [x]  Makes sure you receive the medical care that is consistent with your values, goals, and preferences  [x]  It provides guidance to your family and loved ones and reduces their  decisional burden about whether or not they are making the right decisions based on your wishes.  Follow the link provided in your after visit summary or read over the paperwork we have mailed to you to help you started getting your Advance Directives in place. If you need assistance in completing these, please reach out to us  so that we can help you!  See attachments for Preventive Care and Fall Prevention Tips.

## 2023-08-08 ENCOUNTER — Other Ambulatory Visit: Payer: Self-pay

## 2023-08-08 NOTE — Patient Outreach (Signed)
 Complex Care Management   Visit Note  08/08/2023  Name:  Mark Nguyen MRN: [id number] DOB: [birth year]  Situation: Referral received for Complex Care Management related to Atrial Fibrillation I obtained verbal consent from Patient.  Visit completed with Lynwood Pellant  on the phone  Background:   Past Medical History:  Diagnosis Date   CAD (coronary artery disease)    CHF (congestive heart failure) (HCC)    Dyslipidemia    History of ventricular fibrillation    HTN (hypertension)    Hx-sudden cardiac arrest    Ischemic cardiomyopathy    LBBB (left bundle branch block)    Ventricular tachycardia (HCC)     Assessment: Patient Reported Symptoms:  Cognitive Cognitive Status: Able to follow simple commands, Normal speech and language skills Cognitive/Intellectual Conditions Management [RPT]: None reported or documented in medical history or problem list      Neurological Neurological Review of Symptoms: No symptoms reported    HEENT HEENT Symptoms Reported: No symptoms reported      Cardiovascular Cardiovascular Symptoms Reported: Other: Other Cardiovascular Symptoms: My heart flutters Patient reports he has discussed this with cardiology Does patient have uncontrolled Hypertension?: No Cardiovascular Management Strategies: Medical device, Medication therapy  Respiratory Respiratory Symptoms Reported: No symptoms reported    Endocrine Endocrine Symptoms Reported: Not assessed    Gastrointestinal Gastrointestinal Symptoms Reported: Unintentional weight loss Additional Gastrointestinal Details: Note per chart review that patient's daughter mentioned unintenional weight loss to cardiologist on 07/17/23. They were advised to re-establish with PCP, as it has been over a year since his last visit. Patient reports he has had a good appetite. Gastrointestinal Management Strategies: Medication therapy    Genitourinary Genitourinary Symptoms Reported: Frequency Genitourinary  Management Strategies: Medication therapy  Integumentary Integumentary Symptoms Reported: No symptoms reported    Musculoskeletal Musculoskelatal Symptoms Reviewed: Unsteady gait Musculoskeletal Management Strategies: Medical device, Adequate rest (Cane most of the time during the day. Walker in the morning when he gets up and when he goes to bed at night) Falls in the past year?: Yes Number of falls in past year: 2 or more Was there an injury with Fall?: Yes Fall Risk Category Calculator: 3 Patient Fall Risk Level: High Fall Risk Patient at Risk for Falls Due to: History of fall(s), Impaired balance/gait, Orthopedic patient Fall risk Follow up: Falls evaluation completed, Education provided  Psychosocial Psychosocial Symptoms Reported: No symptoms reported Additional Psychological Details: Patient has been taking melatonin to help with sleep.            08/02/2023    1:55 PM  Depression screen PHQ 2/9  Decreased Interest 0  Down, Depressed, Hopeless 0  PHQ - 2 Score 0  Altered sleeping 0  Tired, decreased energy 0  Change in appetite 0  Feeling bad or failure about yourself  0  Trouble concentrating 0  Moving slowly or fidgety/restless 0  Suicidal thoughts 0  PHQ-9 Score 0  Difficult doing work/chores Not difficult at all    There were no vitals filed for this visit.  Medications Reviewed Today     Reviewed by Arno Rosaline SQUIBB, RN (Registered Nurse) on 08/08/23 at [phone]  Med List Status: <None>   Medication Order Taking? Sig Documenting Provider Last Dose Status Informant  apixaban  (ELIQUIS ) 5 MG TABS tablet [id number] Yes Take 1 tablet (5 mg total) by mouth 2 (two) times daily. Leverne Charlies Helling, PA-C  Active   atorvastatin  (LIPITOR) 20 MG tablet [id number] Yes Take 1 tablet (20 mg total) by  mouth at bedtime. Leverne Charlies Helling, PA-C  Active   carvedilol  (COREG ) 25 MG tablet [id number] Yes Take 1 tablet (25 mg total) by mouth 2 (two) times daily with a meal. Leverne Charlies Helling, PA-C  Active   furosemide  (LASIX ) 40 MG tablet [id number] Yes TAKE 1 TABLET BY MOUTH DAILY. HOLD DOSAGE IF WEIGHT < 183 POUNDS Leverne Charlies [place], PA-C  Active   isosorbide  mononitrate (IMDUR ) 30 MG 24 hr tablet [id number] Yes Take 1 tablet (30 mg total) by mouth daily. Leverne Charlies Helling, PA-C  Active   losartan  (COZAAR ) 25 MG tablet [id number] Yes Take 0.5 tablets (12.5 mg total) by mouth at bedtime. Leverne Charlies Helling, PA-C  Active   omeprazole  (PRILOSEC) 20 MG capsule [id number] Yes TAKE 1 CAPSULE BY MOUTH ONCE DAILY. Waddell Danelle ORN, MD  Active   oxybutynin (DITROPAN) 5 MG tablet [id number] Yes Take 5 mg by mouth daily. [provider]  Active   spironolactone  (ALDACTONE ) 25 MG tablet [id number] Yes Take 0.5 tablets (12.5 mg total) by mouth daily. Leverne Charlies Helling, PA-C  Active   Tamsulosin  HCl (FLOMAX ) 0.4 MG CAPS [id number] Yes Take 0.4 mg by mouth daily after breakfast.  [provider]  Active Self            Recommendation:   PCP Follow-up Continue Current Plan of Care  Follow Up Plan:   Closing From:  Complex Care Management Patient has met all care management goals. Care Management case will be closed. Patient has been provided contact information should new needs arise.   Rosaline Finlay, RN MSN [place]  [facility] Population Health RN Care Manager Direct Dial: [phone]  Fax: [phone]

## 2023-08-08 NOTE — Patient Instructions (Signed)
 Visit Information  Thank you for taking time to visit with me today. Please don't hesitate to contact me if I can be of assistance to you before our next scheduled appointment.  Your next care management appointment is no further scheduled appointments.    Closing From: Complex Care Management. Patient has met all care management goals. Care Management case will be closed. Patient has been provided contact information should new needs arise.   Please call the care guide team at [phone] if you need to cancel, schedule, or reschedule an appointment.   Please call the Suicide and Crisis Lifeline: 988 call 1-800-273-TALK (toll free, 24 hour hotline) if you are experiencing a Mental Health or Behavioral Health Crisis or need someone to talk to.  Rosaline Finlay, RN MSN [place]  VBCI Population Health RN Care Manager Direct Dial: [phone]  Fax: [phone]

## 2023-08-14 ENCOUNTER — Ambulatory Visit

## 2023-08-14 ENCOUNTER — Other Ambulatory Visit: Payer: Self-pay

## 2023-08-14 VITALS — BP 104/68 | HR 79 | Temp 98.4°F | Ht 67.0 in | Wt 162.4 lb

## 2023-08-14 DIAGNOSIS — Z23 Encounter for immunization: Secondary | ICD-10-CM | POA: Diagnosis not present

## 2023-08-14 DIAGNOSIS — M1A0221 Idiopathic chronic gout, left elbow, with tophus (tophi): Secondary | ICD-10-CM

## 2023-08-14 DIAGNOSIS — K219 Gastro-esophageal reflux disease without esophagitis: Secondary | ICD-10-CM | POA: Diagnosis not present

## 2023-08-14 DIAGNOSIS — I10 Essential (primary) hypertension: Secondary | ICD-10-CM

## 2023-08-14 DIAGNOSIS — N401 Enlarged prostate with lower urinary tract symptoms: Secondary | ICD-10-CM

## 2023-08-14 DIAGNOSIS — I5042 Chronic combined systolic (congestive) and diastolic (congestive) heart failure: Secondary | ICD-10-CM | POA: Diagnosis not present

## 2023-08-14 DIAGNOSIS — M109 Gout, unspecified: Secondary | ICD-10-CM | POA: Insufficient documentation

## 2023-08-14 DIAGNOSIS — R634 Abnormal weight loss: Secondary | ICD-10-CM

## 2023-08-14 DIAGNOSIS — I4891 Unspecified atrial fibrillation: Secondary | ICD-10-CM

## 2023-08-14 DIAGNOSIS — N400 Enlarged prostate without lower urinary tract symptoms: Secondary | ICD-10-CM | POA: Insufficient documentation

## 2023-08-14 DIAGNOSIS — N3943 Post-void dribbling: Secondary | ICD-10-CM | POA: Diagnosis not present

## 2023-08-14 NOTE — Assessment & Plan Note (Signed)
 Follows with cardiology. Some bruising. Reports no epistaxis, melena, or hematuria.  On Eliquis  5 mg twice daily  -Continue Eliquis  5mg  BID

## 2023-08-14 NOTE — Assessment & Plan Note (Addendum)
 Patient has had unintentional weight loss over the past couple of years, with 10 pounds in the last 6 months. He has not had any fevers, chills, or night sweats. No fatigue. He denies chest pain and shortness of breath. He denies melena and blood in the stool. No hematuria. He has a remote history of smoking. He believes that he has been losing weight because he hasn't felt like making dinner. He eats a good breakfast and lunch. He has no guiding symptoms of where to initiate a work-up for his weight loss. He has had mild anemia on a CBC on 7/14, I don't think we need to repeat this today. We will get a CMP today to evaluate his liver and kidney function. He is due to see the urologist later this month regarding his BPH and they have been tracking his PSA levels which have been normal. The patient is OK with a watchful waiting approach at this time, and was advised to continue to check his weight at home. If his weight continues to drop, we would consider imaging of his lungs given his previous history of smoking.   -Patient instructed to continue to weigh himself at home and call if he continues to lose weight -follow-up with urology for PSA -follow up in 6 months -CMP today

## 2023-08-14 NOTE — Patient Instructions (Signed)
 Thank you, [name], for allowing us  to provide your care today. Today we discussed . . .  > Weight loss       - Keep track of your weight and let us  know if you are having worsening weight loss or symptoms of fever, chills, or other concerning symptoms > Vaccination       - We gave you the pneumonia vaccine today   I have ordered the following labs for you:  Lab Orders         Comprehensive metabolic panel with GFR        Referrals ordered today:   Referral Orders  No referral(s) requested today      Follow up: 6 months    Remember:  Should you have any questions or concerns please call the internal medicine clinic at [phone].     Melvenia Morrison, [facility]

## 2023-08-14 NOTE — Assessment & Plan Note (Signed)
 Patient has GERD symptoms.  These are somewhat managed with Prilosec 20 mg he can continue this medication.  It is currently being prescribed by cardiology but I am happy to take over this medication.  - Continue Prilosec 20 mg

## 2023-08-14 NOTE — Assessment & Plan Note (Signed)
 Gouty tophi present on the left elbow and multiple joints of the right and left hands.  They have been somewhat stable in their size for the past year or so.  They used to be painful for him but have not been painful for the past several years.  They do not interfere with his function.  We discussed that starting medication would not decrease the size of these tophi and would like to avoid polypharmacy.  If they start to increase in size we could consider starting him on allopurinol at a follow-up.

## 2023-08-14 NOTE — Assessment & Plan Note (Signed)
 BP today slightly soft at 104/68. He does not endorse any symptoms of hypotension and denies syncope and presyncope.  Current medications Coreg  25 mg, Lasix  40 mg, Imdur  30 mg, Losartan  12.5 mg, Spironolactone  12.5 mg.  If he continues to have low blood pressures, we would stop his Imdur .  - Check BP in 6 months, if low stop Imdur 

## 2023-08-14 NOTE — Progress Notes (Signed)
 CC: Routine Follow Up for primary care after last office visit 08/26/21  HPI:  Mark Nguyen is a 89 y.o. male with pertinent PMH of CAD, A-fib on anticoagulation CHF, HLD, HTN, BPH, MI with ICD , LBB, history of V-tach S/P remote ablation, femur fracture S/P IM nailing who presents for an annual physical. Please see problem based assessment and plan for further history.  Review of Systems  Constitutional:  Positive for weight loss. Negative for chills and fever.  HENT:  Positive for hearing loss.   Respiratory:  Negative for shortness of breath.   Cardiovascular:  Negative for chest pain.  Gastrointestinal:  Positive for heartburn. Negative for nausea and vomiting.    Medications: Current Outpatient Medications  Medication Instructions   apixaban  (ELIQUIS ) 5 mg, Oral, 2 times daily   atorvastatin  (LIPITOR) 20 mg, Oral, Daily at bedtime   carvedilol  (COREG ) 25 mg, Oral, 2 times daily with meals   furosemide  (LASIX ) 40 MG tablet TAKE 1 TABLET BY MOUTH DAILY. HOLD DOSAGE IF WEIGHT < 183 POUNDS   isosorbide  mononitrate (IMDUR ) 30 mg, Oral, Daily   losartan  (COZAAR ) 12.5 mg, Oral, Daily at bedtime   omeprazole  (PRILOSEC) 20 mg, Oral, Daily   oxybutynin (DITROPAN) 5 mg, Daily   spironolactone  (ALDACTONE ) 12.5 mg, Oral, Daily   tamsulosin  (FLOMAX ) 0.4 mg, Daily after breakfast     Physical Exam:  Vitals:   08/14/23 0838  BP: 104/68  Pulse: 79  Temp: 98.4 F (36.9 C)  TempSrc: Oral  SpO2: 98%  Weight: 162 lb 6.4 oz (73.7 kg)  Height: 5' 7 (1.702 m)    Physical Exam Constitutional:      General: He is not in acute distress.    Appearance: He is not ill-appearing.  HENT:     Right Ear: Decreased hearing noted.     Left Ear: Decreased hearing noted.  Cardiovascular:     Rate and Rhythm: Normal rate and regular rhythm.  Pulmonary:     Effort: No respiratory distress.  Musculoskeletal:     Right lower leg: No edema.     Left lower leg: No edema.     Comments: Large  gouty tophi over left elbow and multiple joints of the left and right hands. Non-inflammatory appearing. No warmth  Skin:    Findings: Bruising present.  Neurological:     Mental Status: He is alert.       Assessment & Plan:   Assessment & Plan Unintentional weight loss Patient has had unintentional weight loss over the past couple of years, with 10 pounds in the last 6 months. He has not had any fevers, chills, or night sweats. No fatigue. He denies chest pain and shortness of breath. He denies melena and blood in the stool. No hematuria. He has a remote history of smoking. He believes that he has been losing weight because he hasn't felt like making dinner. He eats a good breakfast and lunch. He has no guiding symptoms of where to initiate a work-up for his weight loss. He has had mild anemia on a CBC on 7/14, I don't think we need to repeat this today. We will get a CMP today to evaluate his liver and kidney function. He is due to see the urologist later this month regarding his BPH and they have been tracking his PSA levels which have been normal. The patient is OK with a watchful waiting approach at this time, and was advised to continue to check his weight at  home. If his weight continues to drop, we would consider imaging of his lungs given his previous history of smoking.   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  They have been somewhat stable in their size for the past year or so.  They used to be painful for him but have not been painful for the past several years.  They do not interfere with his function.  We discussed that starting medication would not decrease the size of these tophi and would like to avoid polypharmacy.  If  they start to increase in size we could consider starting him on allopurinol at a follow-up. Essential hypertension BP today slightly soft at 104/68. He does not endorse any symptoms of hypotension and denies syncope and presyncope.  Current medications Coreg  25 mg, Lasix  40 mg, Imdur  30 mg, Losartan  12.5 mg, Spironolactone  12.5 mg.  If he continues to have low blood pressures, we would stop his Imdur .  - Check BP in 6 months, if low stop Imdur  Benign prostatic hyperplasia with post-void dribbling Follows with urology regarding BPH.  Does not have a history of prostate cancer.  Started on Oxybutynin for dribbling by urology.  Also takes tamsulosin  0.4 mg  - Continue oxybutynin and tamsulosin  Gastroesophageal reflux disease, unspecified whether esophagitis present Patient has GERD symptoms.  These are somewhat managed with Prilosec 20 mg he can continue this medication.  It is currently being prescribed by cardiology but I am happy to take over this medication.  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 Has an ICD. GDMT with Coreg  losartan  and spironolactone .  Also takes Lasix  40 mg most days.  Unclear what his dry weight is.  Follows with cardiology regarding this.  They will see him in 6 months.  - Continue GDMT as above Atrial fibrillation, unspecified type [facility]) Follows with cardiology. Some bruising. Reports no epistaxis, melena, or hematuria.  On Eliquis  5 mg twice daily  -Continue Eliquis  5mg  BID Encounter for Prevnar pneumococcal vaccination Provided with Prevnar 20 vaccination today  Orders Placed This Encounter  Procedures   Pneumococcal conjugate vaccine 20-valent (Prevnar 20)   Comprehensive metabolic panel with GFR     Patient seen with Dr. Ronnald Karna Melvenia Napoleon, MD [facility] Internal Medicine Resident PGY-1 Clinic Phone:  [phone] Please contact the on call pager at [phone] for any urgent or emergent needs.

## 2023-08-14 NOTE — Assessment & Plan Note (Signed)
 Follows with urology regarding BPH.  Does not have a history of prostate cancer.  Started on Oxybutynin for dribbling by urology.  Also takes tamsulosin  0.4 mg  - Continue oxybutynin and tamsulosin 

## 2023-08-14 NOTE — Assessment & Plan Note (Signed)
 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 Has an ICD. GDMT with Coreg  losartan  and spironolactone .  Also takes Lasix  40 mg most days.  Unclear what his dry weight is.  Follows with cardiology regarding this.  They will see him in 6 months.  - Continue GDMT as above

## 2023-08-14 NOTE — Patient Instructions (Signed)
 Thank you, Mark Nguyen, for allowing us  to provide your care today. Today we discussed . . .  > Weight loss       - Keep track of your weight and let us  know if you are having worsening weight loss or symptoms of fever, chills, or other concerning symptoms > Vaccination       - We gave you the pneumonia vaccine today   I have ordered the following labs for you:  Lab Orders         Comprehensive metabolic panel with GFR        Referrals ordered today:   Referral Orders  No referral(s) requested today      Follow up: 6 months    Remember:  Should you have any questions or concerns please call the internal medicine clinic at [phone].     Melvenia Morrison, [facility]

## 2023-08-15 ENCOUNTER — Ambulatory Visit: Payer: Self-pay

## 2023-08-15 LAB — COMPREHENSIVE METABOLIC PANEL WITH GFR
ALT: 10 IU/L (ref 0–44)
AST: 14 IU/L (ref 0–40)
Albumin: 4.4 g/dL (ref 3.7–4.7)
Alkaline Phosphatase: 93 IU/L (ref 44–121)
BUN/Creatinine Ratio: 27 — ABNORMAL HIGH (ref 10–24)
BUN: 27 mg/dL (ref 8–27)
Bilirubin Total: 0.7 mg/dL (ref 0.0–1.2)
CO2: 22 mmol/L (ref 20–29)
Calcium: 9.8 mg/dL (ref 8.6–10.2)
Chloride: 104 mmol/L (ref 96–106)
Creatinine, Ser: 1 mg/dL (ref 0.76–1.27)
Globulin, Total: 2 g/dL (ref 1.5–4.5)
Glucose: 125 mg/dL — ABNORMAL HIGH (ref 70–99)
Potassium: 4.6 mmol/L (ref 3.5–5.2)
Sodium: 142 mmol/L (ref 134–144)
Total Protein: 6.4 g/dL (ref 6.0–8.5)
eGFR: 72 mL/min/1.73 (ref >59)

## 2023-08-15 NOTE — Progress Notes (Signed)
 Internal Medicine Clinic Attending  I was physically present during the key portions of the resident provided service and participated in the medical decision making of patient's management care. I reviewed pertinent patient test results.  The assessment, diagnosis, and plan were formulated together and I agree with the documentation in the resident's note.  Gradual weight loss with reduced dietary intake reported by patient. He does not have additional signs/symptoms. Recent lab work not revealing. He is not particularly interested in aggressive approach to evaluation, therefore we have advised him to continue monitoring weight and return if weight continues to fall and/or developing additional symptoms.   Karna Fellows, MD

## 2023-08-15 NOTE — Progress Notes (Signed)
 Patient called and infomed of normal lab results. No clear cause of weight loss. Consider working up further if the patient has continued weight loss with lung imaging if the patient would like to proceed with searching for a diagnosis. He will follow-up with urology for a PSA and will follow-up with us  in 6-9 months. He was advised to keep track of his weight and call us  if he continued to lose weight unintentionally.

## 2023-08-21 NOTE — Progress Notes (Signed)
 Remote pacemaker transmission.

## 2023-08-21 NOTE — Addendum Note (Signed)
 Addended by: VICCI SELLER A on: 08/21/2023 10:35 AM   Modules accepted: Orders

## 2023-09-22 ENCOUNTER — Ambulatory Visit (INDEPENDENT_AMBULATORY_CARE_PROVIDER_SITE_OTHER)

## 2023-09-22 DIAGNOSIS — I428 Other cardiomyopathies: Secondary | ICD-10-CM | POA: Diagnosis not present

## 2023-09-22 LAB — CUP PACEART REMOTE DEVICE CHECK
Battery Remaining Longevity: 46 mo
Battery Remaining Percentage: 65 %
Battery Voltage: 2.98 V
Date Time Interrogation Session: 20250919040504
Implantable Lead Connection Status: 753985
Implantable Lead Connection Status: 753985
Implantable Lead Connection Status: 753985
Implantable Lead Implant Date: 20030319
Implantable Lead Implant Date: 20030319
Implantable Lead Implant Date: 20110211
Implantable Lead Location: 753858
Implantable Lead Location: 753859
Implantable Lead Location: 753860
Implantable Lead Model: 158
Implantable Lead Model: 4087
Implantable Lead Serial Number: 113412
Implantable Lead Serial Number: 159867
Implantable Pulse Generator Implant Date: 20221223
Lead Channel Impedance Value: 360 Ohm
Lead Channel Impedance Value: 610 Ohm
Lead Channel Pacing Threshold Amplitude: 0.75 V
Lead Channel Pacing Threshold Amplitude: 1.5 V
Lead Channel Pacing Threshold Pulse Width: 0.5 ms
Lead Channel Pacing Threshold Pulse Width: 1 ms
Lead Channel Sensing Intrinsic Amplitude: 9.9 mV
Lead Channel Setting Pacing Amplitude: 2.5 V
Lead Channel Setting Pacing Amplitude: 2.5 V
Lead Channel Setting Pacing Pulse Width: 0.5 ms
Lead Channel Setting Pacing Pulse Width: 1 ms
Lead Channel Setting Sensing Sensitivity: 2 mV
Pulse Gen Model: 3222
Pulse Gen Serial Number: 3978258

## 2023-09-23 ENCOUNTER — Other Ambulatory Visit: Payer: Self-pay | Admitting: Internal Medicine

## 2023-09-26 NOTE — Progress Notes (Signed)
 Remote PPM Transmission

## 2023-09-27 DIAGNOSIS — R351 Nocturia: Secondary | ICD-10-CM | POA: Diagnosis not present

## 2023-09-27 DIAGNOSIS — C61 Malignant neoplasm of prostate: Secondary | ICD-10-CM | POA: Diagnosis not present

## 2023-09-30 ENCOUNTER — Ambulatory Visit: Payer: Self-pay | Admitting: Internal Medicine

## 2023-12-22 ENCOUNTER — Ambulatory Visit (INDEPENDENT_AMBULATORY_CARE_PROVIDER_SITE_OTHER)

## 2023-12-22 DIAGNOSIS — I428 Other cardiomyopathies: Secondary | ICD-10-CM

## 2024-01-03 LAB — CUP PACEART REMOTE DEVICE CHECK
Battery Remaining Longevity: 43 mo
Battery Remaining Percentage: 62 %
Battery Voltage: 2.98 V
Date Time Interrogation Session: 20251219074607
Implantable Lead Connection Status: 753985
Implantable Lead Connection Status: 753985
Implantable Lead Connection Status: 753985
Implantable Lead Implant Date: 20030319
Implantable Lead Implant Date: 20030319
Implantable Lead Implant Date: 20110211
Implantable Lead Location: 753858
Implantable Lead Location: 753859
Implantable Lead Location: 753860
Implantable Lead Model: 158
Implantable Lead Model: 4087
Implantable Lead Serial Number: 113412
Implantable Lead Serial Number: 159867
Implantable Pulse Generator Implant Date: 20221223
Lead Channel Impedance Value: 340 Ohm
Lead Channel Impedance Value: 610 Ohm
Lead Channel Pacing Threshold Amplitude: 0.75 V
Lead Channel Pacing Threshold Amplitude: 1.875 V
Lead Channel Pacing Threshold Pulse Width: 0.5 ms
Lead Channel Pacing Threshold Pulse Width: 1 ms
Lead Channel Sensing Intrinsic Amplitude: 12 mV
Lead Channel Setting Pacing Amplitude: 2.5 V
Lead Channel Setting Pacing Amplitude: 2.5 V
Lead Channel Setting Pacing Pulse Width: 0.5 ms
Lead Channel Setting Pacing Pulse Width: 1 ms
Lead Channel Setting Sensing Sensitivity: 2 mV
Pulse Gen Model: 3222
Pulse Gen Serial Number: 3978258

## 2024-01-03 NOTE — Progress Notes (Signed)
 Remote PPM Transmission

## 2024-01-05 ENCOUNTER — Ambulatory Visit: Payer: Self-pay | Admitting: Cardiology

## 2024-01-19 ENCOUNTER — Other Ambulatory Visit: Payer: Self-pay | Admitting: Physician Assistant

## 2024-01-19 NOTE — Telephone Encounter (Signed)
 In accordance with refill protocols, please review and address the following requirements before this medication refill can be authorized:  Labs -Mag

## 2024-02-08 MED ORDER — FUROSEMIDE 40 MG PO TABS: ORAL_TABLET | ORAL | 0 refills | Status: AC

## 2024-03-22 ENCOUNTER — Encounter

## 2024-06-21 ENCOUNTER — Encounter

## 2024-08-07 ENCOUNTER — Ambulatory Visit

## 2024-09-20 ENCOUNTER — Encounter
